# Patient Record
Sex: Female | Born: 1965
Health system: Southern US, Community
[De-identification: ages and names within clinical notes are randomized; demographics above are authoritative.]

## PROBLEM LIST (undated history)

## (undated) DIAGNOSIS — F1910 Other psychoactive substance abuse, uncomplicated: Secondary | ICD-10-CM

## (undated) DIAGNOSIS — J449 Chronic obstructive pulmonary disease, unspecified: Secondary | ICD-10-CM

## (undated) DIAGNOSIS — F419 Anxiety disorder, unspecified: Secondary | ICD-10-CM

## (undated) HISTORY — PX: CHOLECYSTECTOMY: SHX55

## (undated) HISTORY — PX: TUBAL LIGATION: SHX77

---

## 1998-10-15 ENCOUNTER — Emergency Department (HOSPITAL_COMMUNITY): Admission: EM | Admit: 1998-10-15 | Discharge: 1998-10-15 | Payer: Self-pay | Admitting: Emergency Medicine

## 1999-02-19 ENCOUNTER — Encounter: Payer: Self-pay | Admitting: *Deleted

## 1999-02-19 ENCOUNTER — Emergency Department (HOSPITAL_COMMUNITY): Admission: EM | Admit: 1999-02-19 | Discharge: 1999-02-19 | Payer: Self-pay | Admitting: Emergency Medicine

## 2000-10-13 ENCOUNTER — Inpatient Hospital Stay (HOSPITAL_COMMUNITY): Admission: AD | Admit: 2000-10-13 | Discharge: 2000-10-13 | Payer: Self-pay | Admitting: Obstetrics & Gynecology

## 2003-07-10 ENCOUNTER — Emergency Department (HOSPITAL_COMMUNITY): Admission: EM | Admit: 2003-07-10 | Discharge: 2003-07-10 | Payer: Self-pay | Admitting: Emergency Medicine

## 2004-10-10 ENCOUNTER — Emergency Department (HOSPITAL_COMMUNITY): Admission: EM | Admit: 2004-10-10 | Discharge: 2004-10-10 | Payer: Self-pay | Admitting: Emergency Medicine

## 2004-10-12 ENCOUNTER — Emergency Department (HOSPITAL_COMMUNITY): Admission: EM | Admit: 2004-10-12 | Discharge: 2004-10-12 | Payer: Self-pay | Admitting: Emergency Medicine

## 2005-04-11 ENCOUNTER — Emergency Department (HOSPITAL_COMMUNITY): Admission: EM | Admit: 2005-04-11 | Discharge: 2005-04-11 | Payer: Self-pay | Admitting: Emergency Medicine

## 2007-10-11 ENCOUNTER — Emergency Department (HOSPITAL_COMMUNITY): Admission: EM | Admit: 2007-10-11 | Discharge: 2007-10-12 | Payer: Self-pay | Admitting: Emergency Medicine

## 2008-04-13 ENCOUNTER — Emergency Department (HOSPITAL_COMMUNITY): Admission: EM | Admit: 2008-04-13 | Discharge: 2008-04-13 | Payer: Self-pay | Admitting: Emergency Medicine

## 2009-02-13 ENCOUNTER — Emergency Department (HOSPITAL_COMMUNITY): Admission: EM | Admit: 2009-02-13 | Discharge: 2009-02-13 | Payer: Self-pay | Admitting: Emergency Medicine

## 2010-02-03 ENCOUNTER — Emergency Department (HOSPITAL_COMMUNITY): Admission: EM | Admit: 2010-02-03 | Discharge: 2010-02-03 | Payer: Self-pay | Admitting: Emergency Medicine

## 2010-08-13 ENCOUNTER — Emergency Department (HOSPITAL_COMMUNITY): Admission: EM | Admit: 2010-08-13 | Discharge: 2010-08-13 | Payer: Self-pay | Admitting: Emergency Medicine

## 2011-12-21 ENCOUNTER — Emergency Department (HOSPITAL_COMMUNITY)
Admission: EM | Admit: 2011-12-21 | Discharge: 2011-12-21 | Disposition: A | Discharge status: Home / Self Care | Payer: Self-pay | Attending: Emergency Medicine | Admitting: Emergency Medicine

## 2011-12-21 ENCOUNTER — Encounter: Payer: Self-pay | Admitting: *Deleted

## 2011-12-21 DIAGNOSIS — K089 Disorder of teeth and supporting structures, unspecified: Secondary | ICD-10-CM | POA: Insufficient documentation

## 2011-12-21 DIAGNOSIS — F17200 Nicotine dependence, unspecified, uncomplicated: Secondary | ICD-10-CM | POA: Insufficient documentation

## 2011-12-21 DIAGNOSIS — R6884 Jaw pain: Secondary | ICD-10-CM | POA: Insufficient documentation

## 2011-12-21 DIAGNOSIS — K047 Periapical abscess without sinus: Secondary | ICD-10-CM | POA: Insufficient documentation

## 2011-12-21 DIAGNOSIS — K029 Dental caries, unspecified: Secondary | ICD-10-CM | POA: Insufficient documentation

## 2011-12-21 MED ORDER — HYDROMORPHONE HCL PF 2 MG/ML IJ SOLN
2.0000 mg | Freq: Once | INTRAMUSCULAR | Status: AC
  Administered 2011-12-21: 2 mg via INTRAMUSCULAR
  Filled 2011-12-21: qty 1

## 2011-12-21 MED ORDER — IBUPROFEN 800 MG PO TABS: 800.0000 mg | ORAL_TABLET | Freq: Three times a day (TID) | ORAL | Status: AC

## 2011-12-21 MED ORDER — PERCOCET 5-325 MG PO TABS: 1.0000 | ORAL_TABLET | Freq: Four times a day (QID) | ORAL | Status: AC | PRN

## 2011-12-21 MED ORDER — CLINDAMYCIN HCL 300 MG PO CAPS: 300.0000 mg | ORAL_CAPSULE | Freq: Three times a day (TID) | ORAL | Status: DC

## 2011-12-21 NOTE — ED Provider Notes (Signed)
History     CSN: [id number]  Arrival date & time 12/21/11  7829   First MD Initiated Contact with Patient 12/21/11 [phone]      Chief Complaint  Patient presents with  . Dental Pain    (Consider location/radiation/quality/duration/timing/severity/associated sxs/prior treatment) HPI Comments: Patient right pain and swelling in her right upper jaw and pain where her teeth have decayed and broken.  Denies fevers, sore throat, difficulty swallowing or breathing.  Pt does not have dentist.    Patient is a 45 y.o. female presenting with tooth pain. The history is provided by the patient.  Dental Pain   History reviewed. No pertinent past medical history.  Past Surgical History  Procedure Date  . Cholecystectomy   . Cesarean section     No family history on file.  History  Substance Use Topics  . Smoking status: Current Everyday Smoker -- 1.0 packs/day for 30 years    Types: Cigarettes  . Smokeless tobacco: Not on file  . Alcohol Use: Yes     occasionally    OB History    Grav Para Term Preterm Abortions TAB SAB Ect Mult Living                  Review of Systems  All other systems reviewed and are negative.    Allergies  Penicillins  Home Medications   Current Outpatient Rx  Name Route Sig Dispense Refill  . NAPROXEN SODIUM 220 MG PO TABS Oral Take 220 mg by mouth 2 (two) times daily with a meal. For pain       BP 129/72  Pulse 66  Temp(Src) 98.2 F (36.8 C) (Oral)  Resp 20  Ht 5\' 3"  (1.6 m)  Wt 160 lb (72.576 kg)  BMI 28.34 kg/m2  SpO2 98%  LMP 12/12/2011  Physical Exam  Nursing note and vitals reviewed. Constitutional: She is oriented to person, place, and time. She appears well-developed and well-nourished.  HENT:  Head: Normocephalic and atraumatic.    Mouth/Throat: Oropharynx is clear and moist. No oropharyngeal exudate.         Widespread dental decay, multiple teeth decayed to the gumline.  Swelling adjacent to right upper molars.      Neck: Neck supple.  Pulmonary/Chest: Effort normal.  Neurological: She is alert and oriented to person, place, and time.    ED Course  Procedures (including critical care time)  Labs Reviewed - No data to display No results found.   1. Dental abscess       MDM  Patient with right upper dental abscess without fever or airway compromise.  Have explained importance of 48 hour follow up with on-call dentist.  Pt verbalizes understanding.  Pain treated in ED, prescriptions given.         Dillard Cannon [place], [place] 12/21/11 [phone]

## 2011-12-21 NOTE — ED Provider Notes (Signed)
Medical screening examination/treatment/procedure(s) were performed by non-physician practitioner and as supervising physician I was immediately available for consultation/collaboration.   Chesky Heyer A. Patrica Duel, MD 12/21/11 3086

## 2011-12-21 NOTE — ED Notes (Signed)
Pt c/o upper tooth pain x 2 - swelling noted to jaw. States has the teeth are broken x years - no dentist.

## 2011-12-22 ENCOUNTER — Encounter (HOSPITAL_COMMUNITY): Payer: Self-pay

## 2011-12-22 ENCOUNTER — Emergency Department (HOSPITAL_COMMUNITY)
Admission: EM | Admit: 2011-12-22 | Discharge: 2011-12-22 | Disposition: A | Discharge status: Home / Self Care | Payer: Self-pay | Attending: Emergency Medicine | Admitting: Emergency Medicine

## 2011-12-22 DIAGNOSIS — K089 Disorder of teeth and supporting structures, unspecified: Secondary | ICD-10-CM | POA: Insufficient documentation

## 2011-12-22 DIAGNOSIS — K047 Periapical abscess without sinus: Secondary | ICD-10-CM | POA: Insufficient documentation

## 2011-12-22 DIAGNOSIS — F17200 Nicotine dependence, unspecified, uncomplicated: Secondary | ICD-10-CM | POA: Insufficient documentation

## 2011-12-22 MED ORDER — PENICILLIN V POTASSIUM 500 MG PO TABS: 500.0000 mg | ORAL_TABLET | Freq: Four times a day (QID) | ORAL | Status: AC

## 2011-12-22 MED ORDER — IBUPROFEN 200 MG PO TABS
400.0000 mg | ORAL_TABLET | Freq: Once | ORAL | Status: AC
  Administered 2011-12-22: 400 mg via ORAL
  Filled 2011-12-22: qty 2

## 2011-12-22 MED ORDER — OXYCODONE-ACETAMINOPHEN 5-325 MG PO TABS
2.0000 | ORAL_TABLET | Freq: Once | ORAL | Status: AC
  Administered 2011-12-22: 2 via ORAL
  Filled 2011-12-22: qty 2

## 2011-12-22 NOTE — ED Notes (Signed)
Pt. Reports she was in [facility] last night for same problem.  She was prescribed a med that she cannot afford.  Pt presents today with continued tooth pain and swelling of the right cheek.

## 2011-12-22 NOTE — ED Provider Notes (Signed)
History    45yF presenting with facial pain. Seen in ED yesterday and diagnosed with dental abscess. Given script for clindamycin because of reported PCN allergy. Did not fill because could not afford. Presenting today for different abx. No new complaints. Says not significant change in symptoms since last evaluation. No fever or chills. No n/v.   CSN: [id number]  Arrival date & time 12/22/11  1518   First MD Initiated Contact with Patient 12/22/11 1652      Chief Complaint  Patient presents with  . Dental Pain    (Consider location/radiation/quality/duration/timing/severity/associated sxs/prior treatment) HPI  History reviewed. No pertinent past medical history.  Past Surgical History  Procedure Date  . Cholecystectomy   . Cesarean section     No family history on file.  History  Substance Use Topics  . Smoking status: Current Everyday Smoker -- 1.0 packs/day for 30 years    Types: Cigarettes  . Smokeless tobacco: Not on file  . Alcohol Use: Yes     occasionally    OB History    Grav Para Term Preterm Abortions TAB SAB Ect Mult Living                  Review of Systems   Review of symptoms negative unless otherwise noted in HPI.   Allergies  Penicillins  Home Medications   Current Outpatient Rx  Name Route Sig Dispense Refill  . IBUPROFEN 800 MG PO TABS Oral Take 1 tablet (800 mg total) by mouth 3 (three) times daily. 21 tablet 0  . NAPROXEN SODIUM 220 MG PO TABS Oral Take 220 mg by mouth 2 (two) times daily with a meal. For pain     . PERCOCET 5-325 MG PO TABS Oral Take 1 tablet by mouth every 6 (six) hours as needed for pain. 15 tablet 0    Dispense as written.  Marland Kitchen PENICILLIN V POTASSIUM 500 MG PO TABS Oral Take 1 tablet (500 mg total) by mouth 4 (four) times daily. 40 tablet 0    BP 121/72  Pulse 74  Temp(Src) 98.1 F (36.7 C) (Oral)  Resp 20  SpO2 98%  LMP 12/12/2011  Physical Exam  Nursing note and vitals reviewed. Constitutional: She  appears well-developed and well-nourished. No distress.  HENT:  Head: Normocephalic and atraumatic.  Mouth/Throat: No oropharyngeal exudate.       Mild R facial swelling. Generally poor dentition and widespread decay. Fluctuant lesion coming to head gingival mucosa R maxillary region. Posterior pharynx grossly normal. Uvula midline. Submental tissues soft. No trismus.  Eyes: Conjunctivae are normal. Right eye exhibits no discharge. Left eye exhibits no discharge.  Neck: Neck supple.  Cardiovascular: Normal rate, regular rhythm and normal heart sounds.  Exam reveals no gallop and no friction rub.   No murmur heard. Pulmonary/Chest: Effort normal and breath sounds normal. No respiratory distress.  Abdominal: Soft. She exhibits no distension. There is no tenderness.  Musculoskeletal: She exhibits no edema and no tenderness.  Lymphadenopathy:    She has no cervical adenopathy.  Neurological: She is alert.  Skin: Skin is warm and dry.  Psychiatric: She has a normal mood and affect. Her behavior is normal. Thought content normal.    ED Course  Procedures (including critical care time)  INCISION AND DRAINAGE Performed by: Raeford Razor Consent: Verbal consent obtained. Risks and benefits: risks, benefits and alternatives were discussed Type: abscess  Body area: oral gingival mucosa R maxillary region  Anesthesia: local infiltration  Local anesthetic: lidocaine  1% w/ epinephrine  Anesthetic total: 1.5 ml  Complexity: complex Blunt dissection to break up loculations  Drainage: purulent  Drainage amount: 2cc   Patient tolerance: Patient tolerated the procedure well with no immediate complications.    Labs Reviewed - No data to display No results found.   1. Dental abscess       MDM  Pt reports allergy to PCN is n/v. No rash, swelling or difficulty breathing or swallowing. Side effect and not true allergy. Script for pen VK given because of affordability. Again  stressed need for dental fu. Resource list provided. Given prescription for pain meds yesterday.        Raeford Razor, MD 12/22/11 [phone]

## 2012-06-05 ENCOUNTER — Encounter (HOSPITAL_COMMUNITY): Payer: Self-pay | Admitting: Emergency Medicine

## 2012-06-05 ENCOUNTER — Emergency Department (HOSPITAL_COMMUNITY)
Admission: EM | Admit: 2012-06-05 | Discharge: 2012-06-05 | Disposition: A | Discharge status: Home / Self Care | Payer: Self-pay | Attending: Emergency Medicine | Admitting: Emergency Medicine

## 2012-06-05 ENCOUNTER — Emergency Department (HOSPITAL_COMMUNITY): Payer: Self-pay

## 2012-06-05 DIAGNOSIS — T07XXXA Unspecified multiple injuries, initial encounter: Secondary | ICD-10-CM

## 2012-06-05 DIAGNOSIS — S0990XA Unspecified injury of head, initial encounter: Secondary | ICD-10-CM | POA: Insufficient documentation

## 2012-06-05 DIAGNOSIS — R51 Headache: Secondary | ICD-10-CM | POA: Insufficient documentation

## 2012-06-05 DIAGNOSIS — IMO0002 Reserved for concepts with insufficient information to code with codable children: Secondary | ICD-10-CM | POA: Insufficient documentation

## 2012-06-05 DIAGNOSIS — M542 Cervicalgia: Secondary | ICD-10-CM | POA: Insufficient documentation

## 2012-06-05 MED ORDER — TETANUS-DIPHTH-ACELL PERTUSSIS 5-2.5-18.5 LF-MCG/0.5 IM SUSP
0.5000 mL | Freq: Once | INTRAMUSCULAR | Status: AC
  Administered 2012-06-05: 0.5 mL via INTRAMUSCULAR
  Filled 2012-06-05: qty 0.5

## 2012-06-05 MED ORDER — OXYCODONE-ACETAMINOPHEN 5-325 MG PO TABS
1.0000 | ORAL_TABLET | Freq: Once | ORAL | Status: AC
  Administered 2012-06-05: 1 via ORAL
  Filled 2012-06-05: qty 1

## 2012-06-05 MED ORDER — HYDROCODONE-ACETAMINOPHEN 5-500 MG PO TABS: 1.0000 | ORAL_TABLET | Freq: Four times a day (QID) | ORAL | Status: AC | PRN
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
## 2012-06-05 NOTE — ED Notes (Signed)
Rx x 1, pt voiced understanding to return for worsening in condition.
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
## 2012-06-05 NOTE — Discharge Instructions (Signed)
Abrasions Abrasions are skin scrapes. Their treatment depends on how large and deep the abrasion is. Abrasions do not extend through all layers of the skin. A cut or lesion through all skin layers is called a laceration. HOME CARE INSTRUCTIONS   If you were given a dressing, change it at least once a day or as instructed by your caregiver. If the bandage sticks, soak it off with a solution of water or hydrogen peroxide.   Twice a day, wash the area with soap and water to remove all the cream/ointment. You may do this in a sink, under a tub faucet, or in a shower. Rinse off the soap and pat dry with a clean towel. Look for signs of infection (see below).   Reapply cream/ointment according to your caregiver's instruction. This will help prevent infection and keep the bandage from sticking. Telfa or gauze over the wound and under the dressing or wrap will also help keep the bandage from sticking.   If the bandage becomes wet, dirty, or develops a foul smell, change it as soon as possible.   Only take over-the-counter or prescription medicines for pain, discomfort, or fever as directed by your caregiver.  SEEK IMMEDIATE MEDICAL CARE IF:   Increasing pain in the wound.   Signs of infection develop: redness, swelling, surrounding area is tender to touch, or pus coming from the wound.   You have a fever.   Any foul smell coming from the wound or dressing.  Most skin wounds heal within ten days. Facial wounds heal faster. However, an infection may occur despite proper treatment. You should have the wound checked for signs of infection within 24 to 48 hours or sooner if problems arise. If you were not given a wound-check appointment, look closely at the wound yourself on the second day for early signs of infection listed above. MAKE SURE YOU:   Understand these instructions.   Will watch your condition.   Will get help right away if you are not doing well or get worse.  Document Released:  09/18/2005 Document Revised: 11/28/2011 Document Reviewed: 11/12/2011 [facility] Patient Information 2012 [place], [place].Assault, General Assault includes any behavior, whether intentional or reckless, which results in bodily injury to another person and/or damage to property. Included in this would be any behavior, intentional or reckless, that by its nature would be understood (interpreted) by a reasonable person as intent to harm another person or to damage his/her property. Threats may be oral or written. They may be communicated through regular mail, computer, fax, or phone. These threats may be direct or implied. FORMS OF ASSAULT INCLUDE:  Physically assaulting a person. This includes physical threats to inflict physical harm as well as:   Slapping.   Hitting.   Poking.   Kicking.   Punching.   Pushing.   Arson.   Sabotage.   Equipment vandalism.   Damaging or destroying property.   Throwing or hitting objects.   Displaying a weapon or an object that appears to be a weapon in a threatening manner.   Carrying a firearm of any kind.   Using a weapon to harm someone.   Using greater physical size/strength to intimidate another.   Making intimidating or threatening gestures.   Bullying.   Hazing.   Intimidating, threatening, hostile, or abusive language directed toward another person.   It communicates the intention to engage in violence against that person. And it leads a reasonable person to expect that violent behavior may occur.  Stalking another person.  IF IT HAPPENS AGAIN:  Immediately call for emergency help (911 in [place]   If someone poses clear and immediate danger to you, seek legal authorities to have a protective or restraining order put in place.   Less threatening assaults can at least be reported to authorities.  STEPS TO TAKE IF A SEXUAL ASSAULT HAS HAPPENED  Go to an area of safety. This may include a shelter or staying with a friend. Stay  away from the area where you have been attacked. A large percentage of sexual assaults are caused by a friend, relative or associate.   If medications were given by your caregiver, take them as directed for the full length of time prescribed.   Only take over-the-counter or prescription medicines for pain, discomfort, or fever as directed by your caregiver.   If you have come in contact with a sexual disease, find out if you are to be tested again. If your caregiver is concerned about the HIV/AIDS virus, he/she may require you to have continued testing for several months.   For the protection of your privacy, test results can not be given over the phone. Make sure you receive the results of your test. If your test results are not back during your visit, make an appointment with your caregiver to find out the results. Do not assume everything is normal if you have not heard from your caregiver or the medical facility. It is important for you to follow up on all of your test results.   File appropriate papers with authorities. This is important in all assaults, even if it has occurred in a family or by a friend.  SEEK MEDICAL CARE IF:  You have new problems because of your injuries.   You have problems that may be because of the medicine you are taking, such as:   Rash.   Itching.   Swelling.   Trouble breathing.   You develop belly (abdominal) pain, feel sick to your stomach (nausea) or are vomiting.   You begin to run a temperature.   You need supportive care or referral to a rape crisis center. These are centers with trained personnel who can help you get through this ordeal.  SEEK IMMEDIATE MEDICAL CARE IF:  You are afraid of being threatened, beaten, or abused. In [place] call 911.   You receive new injuries related to abuse.   You develop severe pain in any area injured in the assault or have any change in your condition that concerns you.   You faint or lose consciousness.     You develop chest pain or shortness of breath.  Document Released: 12/09/2005 Document Revised: 11/28/2011 Document Reviewed: 07/27/2008 [facility] Patient Information 2012 [place], [place].

## 2012-09-03 ENCOUNTER — Other Ambulatory Visit (HOSPITAL_COMMUNITY): Payer: Self-pay | Admitting: Nurse Practitioner

## 2012-09-03 DIAGNOSIS — Z1231 Encounter for screening mammogram for malignant neoplasm of breast: Secondary | ICD-10-CM

## 2012-09-17 ENCOUNTER — Ambulatory Visit (HOSPITAL_COMMUNITY): Payer: Self-pay | Attending: Nurse Practitioner

## 2013-03-18 ENCOUNTER — Emergency Department (HOSPITAL_COMMUNITY)
Admission: EM | Admit: 2013-03-18 | Discharge: 2013-03-18 | Disposition: A | Discharge status: Home / Self Care | Payer: Self-pay | Attending: Emergency Medicine | Admitting: Emergency Medicine

## 2013-03-18 ENCOUNTER — Encounter (HOSPITAL_COMMUNITY): Payer: Self-pay | Admitting: *Deleted

## 2013-03-18 DIAGNOSIS — F17200 Nicotine dependence, unspecified, uncomplicated: Secondary | ICD-10-CM | POA: Insufficient documentation

## 2013-03-18 DIAGNOSIS — K0889 Other specified disorders of teeth and supporting structures: Secondary | ICD-10-CM

## 2013-03-18 DIAGNOSIS — K089 Disorder of teeth and supporting structures, unspecified: Secondary | ICD-10-CM | POA: Insufficient documentation

## 2013-03-18 MED ORDER — TRAMADOL HCL 50 MG PO TABS
50.0000 mg | ORAL_TABLET | Freq: Once | ORAL | Status: AC
  Administered 2013-03-18: 50 mg via ORAL
  Filled 2013-03-18: qty 1

## 2013-03-18 MED ORDER — CLINDAMYCIN HCL 150 MG PO CAPS: 300.0000 mg | ORAL_CAPSULE | Freq: Three times a day (TID) | ORAL | Status: DC

## 2013-03-18 MED ORDER — CLINDAMYCIN HCL 150 MG PO CAPS
300.0000 mg | ORAL_CAPSULE | Freq: Once | ORAL | Status: AC
  Administered 2013-03-18: 300 mg via ORAL
  Filled 2013-03-18: qty 2

## 2013-03-18 MED ORDER — OXYCODONE-ACETAMINOPHEN 5-325 MG PO TABS
1.0000 | ORAL_TABLET | Freq: Once | ORAL | Status: AC
  Administered 2013-03-18: 1 via ORAL
  Filled 2013-03-18: qty 1

## 2013-03-18 MED ORDER — OXYCODONE-ACETAMINOPHEN 5-325 MG PO TABS: 2.0000 | ORAL_TABLET | ORAL | Status: DC | PRN

## 2013-03-18 NOTE — ED Notes (Signed)
Pt seen here previously. Never saw a dentist, "I can't afford it".

## 2013-03-18 NOTE — ED Notes (Signed)
Pt with intermittent dental pain for several months on 5 different teeth in her mouth c/o increasing pain that oragel is not helping with.

## 2013-03-18 NOTE — ED Provider Notes (Signed)
History     CSN: [id number]  Arrival date & time 03/18/13  1349   First MD Initiated Contact with Patient 03/18/13 1422      Chief Complaint  Patient presents with  . Dental Pain    (Consider location/radiation/quality/duration/timing/severity/associated sxs/prior treatment) HPI Comments: The patient is a 47 year old otherwise healthy female who presents with dental pain that started gradually two days ago. The dental pain is severe, constant and progressively worsening. The pain is aching and located in right upper jaw. The pain does not radiate. Eating makes the pain worse. Nothing makes the pain better. The patient has tried oragel for pain. No associated symptoms. Patient denies headache, neck pain/stiffness, fever, NVD, edema, sore throat, throat swelling, wheezing, SOB, chest pain, abdominal pain.     Patient is a 47 y.o. female presenting with tooth pain.  Dental Pain   History reviewed. No pertinent past medical history.  Past Surgical History  Procedure Laterality Date  . Cholecystectomy    . Cesarean section    . Tubal ligation      No family history on file.  History  Substance Use Topics  . Smoking status: Current Every Day Smoker -- 1.00 packs/day for 30 years    Types: Cigarettes  . Smokeless tobacco: Not on file  . Alcohol Use: Yes     Comment: occasionally    OB History   Grav Para Term Preterm Abortions TAB SAB Ect Mult Living                  Review of Systems  HENT: Positive for dental problem.   All other systems reviewed and are negative.    Allergies  Penicillins  Home Medications   Current Outpatient Rx  Name  Route  Sig  Dispense  Refill  . acetaminophen (TYLENOL) 500 MG tablet   Oral   Take 1,500 mg by mouth every 6 (six) hours as needed for pain.           BP 119/81  Pulse 84  Temp(Src) 98.4 F (36.9 C) (Oral)  Resp 18  SpO2 95%  Physical Exam  Nursing note and vitals reviewed. Constitutional: She is oriented to  person, place, and time. She appears well-developed and well-nourished. No distress.  HENT:  Head: Normocephalic and atraumatic.  Mouth/Throat: Oropharynx is clear and moist. No oropharyngeal exudate.  Poor dentition. Multiple cracked and decayed teeth. Right upper molars tender to percussion.   Eyes: Conjunctivae are normal.  Neck: Normal range of motion. Neck supple.  Cardiovascular: Normal rate and regular rhythm.  Exam reveals no gallop and no friction rub.   No murmur heard. Pulmonary/Chest: Effort normal and breath sounds normal. She has no wheezes. She has no rales. She exhibits no tenderness.  Abdominal: Soft. There is no tenderness.  Musculoskeletal: Normal range of motion.  Lymphadenopathy:    She has no cervical adenopathy.  Neurological: She is alert and oriented to person, place, and time.  Speech is goal-oriented. Moves limbs without ataxia.   Skin: Skin is warm and dry.  Psychiatric: She has a normal mood and affect. Her behavior is normal.    ED Course  Procedures (including critical care time)  Labs Reviewed - No data to display No results found.   1. Pain, dental       MDM  3:14 PM Patient will be discharged with Clindamycin and Percocet. Patient instructed to follow up with a dentist contracted through [facility]. Patient instructed to call within 48  hours of being seen in the ED to guarantee appointment. Vitals stable and patient afebrile. Patient instructed to return with worsening or concerning symptoms.        Emilia Beck, PA-C 03/18/13 1520

## 2013-03-20 NOTE — ED Provider Notes (Signed)
Medical screening examination/treatment/procedure(s) were performed by non-physician practitioner and as supervising physician I was immediately available for consultation/collaboration.  Ellyse Rotolo, MD 03/20/13 1558 

## 2014-02-19 ENCOUNTER — Emergency Department (HOSPITAL_COMMUNITY): Payer: Self-pay

## 2014-02-19 ENCOUNTER — Encounter (HOSPITAL_COMMUNITY): Payer: Self-pay | Admitting: Emergency Medicine

## 2014-02-19 ENCOUNTER — Emergency Department (HOSPITAL_COMMUNITY)
Admission: EM | Admit: 2014-02-19 | Discharge: 2014-02-19 | Disposition: A | Discharge status: Home / Self Care | Payer: Self-pay | Attending: Emergency Medicine | Admitting: Emergency Medicine

## 2014-02-19 DIAGNOSIS — S62509A Fracture of unspecified phalanx of unspecified thumb, initial encounter for closed fracture: Secondary | ICD-10-CM

## 2014-02-19 DIAGNOSIS — X500XXA Overexertion from strenuous movement or load, initial encounter: Secondary | ICD-10-CM | POA: Insufficient documentation

## 2014-02-19 DIAGNOSIS — Y929 Unspecified place or not applicable: Secondary | ICD-10-CM | POA: Insufficient documentation

## 2014-02-19 DIAGNOSIS — Z792 Long term (current) use of antibiotics: Secondary | ICD-10-CM | POA: Insufficient documentation

## 2014-02-19 DIAGNOSIS — M25539 Pain in unspecified wrist: Secondary | ICD-10-CM

## 2014-02-19 DIAGNOSIS — Z880 Allergy status to penicillin: Secondary | ICD-10-CM | POA: Insufficient documentation

## 2014-02-19 DIAGNOSIS — F17200 Nicotine dependence, unspecified, uncomplicated: Secondary | ICD-10-CM | POA: Insufficient documentation

## 2014-02-19 DIAGNOSIS — S62639A Displaced fracture of distal phalanx of unspecified finger, initial encounter for closed fracture: Secondary | ICD-10-CM | POA: Insufficient documentation

## 2014-02-19 DIAGNOSIS — Y9372 Activity, wrestling: Secondary | ICD-10-CM | POA: Insufficient documentation

## 2014-02-19 MED ORDER — HYDROCODONE-ACETAMINOPHEN 5-325 MG PO TABS
1.0000 | ORAL_TABLET | Freq: Once | ORAL | Status: AC
  Administered 2014-02-19: 1 via ORAL
  Filled 2014-02-19: qty 1

## 2014-02-19 MED ORDER — HYDROCODONE-ACETAMINOPHEN 5-325 MG PO TABS: 1.0000 | ORAL_TABLET | ORAL | Status: DC | PRN

## 2014-02-19 NOTE — ED Notes (Signed)
Pt took bus to ED, sister will pick her up when discharged

## 2014-02-19 NOTE — ED Notes (Signed)
Patient transported to X-ray 
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
## 2014-02-19 NOTE — Progress Notes (Signed)
Orthopedic Tech Progress Note Patient Details:  Linda Foley 04-11-1966 [id number] Thumb spica splint applied to Right UE. Application tolerated well.  Ortho Devices Type of Ortho Device: Thumb spica splint Splint Material: Plaster Ortho Device/Splint Location: Right UE Ortho Device/Splint Interventions: Application   Asia R Thompson 02/19/2014, 5:03 PM

## 2014-02-19 NOTE — Discharge Instructions (Signed)
Read the information below.  Use the prescribed medication as directed.  Please discuss all new medications with your pharmacist.  Do not take additional tylenol while taking the prescribed pain medication to avoid overdose.  You may return to the Emergency Department at any time for worsening condition or any new symptoms that concern you.  If you develop uncontrolled pain, weakness or numbness of the extremity, severe discoloration of the skin, or you are unable to move your fingers, return to the ER for a recheck.       Cast or Splint Care Casts and splints support injured limbs and keep bones from moving while they heal. It is important to care for your cast or splint at home.  HOME CARE INSTRUCTIONS  Keep the cast or splint uncovered during the drying period. It can take 24 to 48 hours to dry if it is made of plaster. A fiberglass cast will dry in less than 1 hour.  Do not rest the cast on anything harder than a pillow for the first 24 hours.  Do not put weight on your injured limb or apply pressure to the cast until your health care provider gives you permission.  Keep the cast or splint dry. Wet casts or splints can lose their shape and may not support the limb as well. A wet cast that has lost its shape can also create harmful pressure on your skin when it dries. Also, wet skin can become infected.  Cover the cast or splint with a plastic bag when bathing or when out in the rain or snow. If the cast is on the trunk of the body, take sponge baths until the cast is removed.  If your cast does become wet, dry it with a towel or a blow dryer on the cool setting only.  Keep your cast or splint clean. Soiled casts may be wiped with a moistened cloth.  Do not place any hard or soft foreign objects under your cast or splint, such as cotton, toilet paper, lotion, or powder.  Do not try to scratch the skin under the cast with any object. The object could get stuck inside the cast. Also,  scratching could lead to an infection. If itching is a problem, use a blow dryer on a cool setting to relieve discomfort.  Do not trim or cut your cast or remove padding from inside of it.  Exercise all joints next to the injury that are not immobilized by the cast or splint. For example, if you have a long leg cast, exercise the hip joint and toes. If you have an arm cast or splint, exercise the shoulder, elbow, thumb, and fingers.  Elevate your injured arm or leg on 1 or 2 pillows for the first 1 to 3 days to decrease swelling and pain.It is best if you can comfortably elevate your cast so it is higher than your heart. SEEK MEDICAL CARE IF:   Your cast or splint cracks.  Your cast or splint is too tight or too loose.  You have unbearable itching inside the cast.  Your cast becomes wet or develops a soft spot or area.  You have a bad smell coming from inside your cast.  You get an object stuck under your cast.  Your skin around the cast becomes red or raw.  You have new pain or worsening pain after the cast has been applied. SEEK IMMEDIATE MEDICAL CARE IF:   You have fluid leaking through the cast.  You  are unable to move your fingers or toes.  You have discolored (blue or white), cool, painful, or very swollen fingers or toes beyond the cast.  You have tingling or numbness around the injured area.  You have severe pain or pressure under the cast.  You have any difficulty with your breathing or have shortness of breath.  You have chest pain. Document Released: 12/06/2000 Document Revised: 09/29/2013 Document Reviewed: 06/17/2013 [facility] Patient Information 2014 [place], [place].  Wrist Pain Wrist injuries are frequent in adults and children. A sprain is an injury to the ligaments that hold your bones together. A strain is an injury to muscle or muscle cord-like structures (tendons) from stretching or pulling. Generally, when wrists are moderately tender to touch following  a fall or injury, a break in the bone (fracture) may be present. Most wrist sprains or strains are better in 3 to 5 days, but complete healing may take several weeks. HOME CARE INSTRUCTIONS   Put ice on the injured area.  Put ice in a plastic bag.  Place a towel between your skin and the bag.  Leave the ice on for 15-20 minutes, 03-04 times a day, for the first 2 days.  Keep your arm raised above the level of your heart whenever possible to reduce swelling and pain.  Rest the injured area for at least 48 hours or as directed by your caregiver.  If a splint or elastic bandage has been applied, use it for as long as directed by your caregiver or until seen by a caregiver for a follow-up exam.  Only take over-the-counter or prescription medicines for pain, discomfort, or fever as directed by your caregiver.  Keep all follow-up appointments. You may need to follow up with a specialist or have follow-up X-rays. Improvement in pain level is not a guarantee that you did not fracture a bone in your wrist. The only way to determine whether or not you have a broken bone is by X-ray. SEEK IMMEDIATE MEDICAL CARE IF:   Your fingers are swollen, very red, white, or cold and blue.  Your fingers are numb or tingling.  You have increasing pain.  You have difficulty moving your fingers. MAKE SURE YOU:   Understand these instructions.  Will watch your condition.  Will get help right away if you are not doing well or get worse. Document Released: 09/18/2005 Document Revised: 03/02/2012 Document Reviewed: 01/30/2011 [facility] Patient Information 2014 [place], [place].  Thumb Fracture  There are many types of thumb fractures (breaks). There are different ways of treating these fractures, all of which may be correct, varying from case to case. Your caregiver will discuss different ways to treat these fractures with you. TREATMENT   Immobilization. This means the fracture is casted as it is without  changing the positions of the fracture (bone pieces) involved. This fracture is casted in a "thumb spica" also called a hitchhiker cast. It is generally left on for 2 to 6 weeks.  Closed reduction. The bones are manipulated back into position without using surgery.  ORIF (open reduction and internal fixation). The fracture site is opened and the bone pieces are fixed into place with some type of hardware such as screws or wires. Your caregiver will discuss the type of fracture you have and the treatment that will be best for that problem. If surgery is the treatment of choice, the following is information for you to know and to let your caregiver know about prior to surgery. LET YOUR CAREGIVERS  KNOW ABOUT:  Allergies.  Medications taken including herbs, eye drops, over the counter medications, and creams.  Use of steroids (by mouth or creams).  Previous problems with anesthetics or Novocain.  Family history of anesthetic complications..  Possibility of pregnancy, if this applies.  History of blood clots (thrombophlebitis).  History of bleeding or blood problems.  Previous surgery.  Other health problems. AFTER THE PROCEDURE  After surgery, you will be taken to the recovery area. A nurse will watch and check your progress. Once you are awake, stable, and taking fluids well, barring other problems you will be allowed to go home. Once home, an ice pack applied to your operative site may help with discomfort and keep the swelling down. Elevate your hand above your heart as much as possible for the first 4-5 days after the injury/surgery. HOME CARE INSTRUCTIONS   Follow your caregiver's instructions as to activities, exercises, physical therapy, and driving a car.  Use thumb and exercise as directed.  Only take over-the-counter or prescription medicines for pain, discomfort, or fever as directed by your caregiver. Do not take aspirin until your caregiver instructs. This can increase  bleeding immediately following surgery. SEEK MEDICAL CARE IF:   There is increased bleeding (more than a small spot) from the wound or from beneath your cast or splint.  There is redness, swelling, or increasing pain in the wound or from beneath your cast or splint.  You have pus coming from wound or from beneath your cast or splint.  An unexplained oral temperature above 102 F (38.9 C) develops.  There is a foul smell coming from the wound or dressing or from beneath your cast or splint. SEEK IMMEDIATE MEDICAL CARE IF:   You develop severe pain, decreased sensation such as numbness or tingling.  You develop a rash.  You have difficulty breathing.  Youhave any allergic problems. If you do not have a window in your cast for observing the wound, a discharge or minor bleeding may show up as a stain on the outside of your cast. Report these findings to your caregiver. If you have a removable splint overlying the surgical dressings it is common to see a small amount of bleeding. Change the dressings as instructed by your caregiver. Document Released: 09/07/2003 Document Revised: 03/02/2012 Document Reviewed: 04/18/2008 [facility] Patient Information 2014 [place], [place].

## 2014-02-19 NOTE — ED Provider Notes (Signed)
CSN: [id number]     Arrival date & time 02/19/14  1418 History  This chart was scribed for non-physician practitioner, Trixie DredgeEmily Bryla Burek, PA-C working with Lyanne CoKevin M Campos, MD by Greggory StallionKayla Andersen, ED scribe. This patient was seen in room TR09C/TR09C and the patient's care was started at 3:27 PM.   Chief Complaint  Patient presents with  . Hand Pain   The history is provided by the patient. No language interpreter was used.   HPI Comments: Linda Foley is a 48 y.o. female who presents to the Emergency Department complaining of right thumb injury that occurred earlier today while she was wrestling with her boyfriend. Pt thinks she bent her thumb back. She has sudden onset right thumb pain and swelling. Rates pain 10/10. Pt states pain is worsened with movement. She has mild numbness in her hand. Denies weakness.  Denies that this was a malicious injury and states that it truly was playful.   History reviewed. No pertinent past medical history. Past Surgical History  Procedure Laterality Date  . Cholecystectomy    . Cesarean section    . Tubal ligation     No family history on file. History  Substance Use Topics  . Smoking status: Current Every Day Smoker -- 1.00 packs/day for 30 years    Types: Cigarettes  . Smokeless tobacco: Not on file  . Alcohol Use: Yes     Comment: occasionally   OB History   Grav Para Term Preterm Abortions TAB SAB Ect Mult Living                 Review of Systems  Musculoskeletal: Positive for arthralgias and joint swelling.  Neurological: Positive for numbness. Negative for weakness.  All other systems reviewed and are negative.   Allergies  Penicillins  Home Medications   Current Outpatient Rx  Name  Route  Sig  Dispense  Refill  . acetaminophen (TYLENOL) 500 MG tablet   Oral   Take 1,500 mg by mouth every 6 (six) hours as needed for pain.         . clindamycin (CLEOCIN) 150 MG capsule   Oral   Take 2 capsules (300 mg total) by mouth 3 (three)  times daily. May dispense as 150mg  capsules   60 capsule   0   . oxyCODONE-acetaminophen (PERCOCET/ROXICET) 5-325 MG per tablet   Oral   Take 2 tablets by mouth every 4 (four) hours as needed for pain.   6 tablet   0     Only fill Percocet with Clindamycin prescription.    BP 124/58  Pulse 69  Temp(Src) 98.1 F (36.7 C) (Oral)  Resp 18  SpO2 98%  Physical Exam  Nursing note and vitals reviewed. Constitutional: She appears well-developed and well-nourished. No distress.  HENT:  Head: Normocephalic and atraumatic.  Neck: Neck supple.  Pulmonary/Chest: Effort normal.  Musculoskeletal:  Tender throughout right thumb. Edematous. Mild ecchymosis. Tenderness over dorsal aspect and thenar eminence. Active ROM somewhat limited secondary to pain. Radial tenderness of wrist. Snuff box tenderness.   Neurological: She is alert.  Skin: She is not diaphoretic.    ED Course  Procedures (including critical care time)  DIAGNOSTIC STUDIES: Oxygen Saturation is 98% on RA, normal by my interpretation.    COORDINATION OF CARE: 3:29 PM-Discussed treatment plan which includes wrist xray, splint and pain medication with pt at bedside and pt agreed to plan.   Labs Review Labs Reviewed - No data to display Imaging Review Dg  Wrist Complete Right  02/19/2014   CLINICAL DATA:  Pain after injury.  EXAM: RIGHT WRIST - COMPLETE 3+ VIEW  COMPARISON:  None.  FINDINGS: There is no evidence of fracture or dislocation over the wrist. There is evidence of patient's known fracture along the volar base of the first distal phalanx. There is no evidence of arthropathy or other focal bone abnormality. Soft tissues are unremarkable.  IMPRESSION: No acute wrist fracture.  Evidence of known fracture along the volar base of the first distal phalanx.   Electronically Signed   By: Elberta Fortis M.D.   On: 02/19/2014 16:30   Dg Finger Thumb Right  02/19/2014   CLINICAL DATA:  Jammed right thumb.  Pain.  EXAM: RIGHT  THUMB 2+V  COMPARISON:  None.  FINDINGS: A small avulsion fracture along the volar plate at the base of the distal phalanx is suggested on the lateral view, but not confirmed on the additional views.  No other evidence of a fracture. The joints are normally aligned. There are mild degenerative changes at the first metacarpophalangeal joint.  IMPRESSION: Small volar plate avulsion fracture at the base of the distal phalanx of the thumb is evident on the lateral view only. No other evidence of a fracture. No dislocation.   Electronically Signed   By: Amie Portland M.D.   On: 02/19/2014 15:18     EKG Interpretation None      MDM   Final diagnoses:  Avulsion fracture of thumb  Wrist pain    Pt with injury to right hand and wrist, found to have avulsion fracture of thumb, also with snuffbox tenderness, concern for occult scaphoid fracture.  Pt placed in short arm thumb spica splint, d/c home with norco, hand surgery follow up.  Discussed result, findings, treatment, and follow up  with patient.  Pt given return precautions.  Pt verbalizes understanding and agrees with plan.      I personally performed the services described in this documentation, which was scribed in my presence. The recorded information has been reviewed and is accurate.   [place], PA-C 02/19/14 [phone]

## 2014-02-20 NOTE — ED Provider Notes (Signed)
Medical screening examination/treatment/procedure(s) were performed by non-physician practitioner and as supervising physician I was immediately available for consultation/collaboration.   EKG Interpretation None        Jackelyn Illingworth M Mickenzie Stolar, MD 02/20/14 0722 

## 2015-12-31 ENCOUNTER — Emergency Department (HOSPITAL_BASED_OUTPATIENT_CLINIC_OR_DEPARTMENT_OTHER)
Admission: EM | Admit: 2015-12-31 | Discharge: 2016-01-01 | Disposition: A | Discharge status: Home / Self Care | Payer: Self-pay | Attending: Emergency Medicine | Admitting: Emergency Medicine

## 2015-12-31 ENCOUNTER — Emergency Department (HOSPITAL_BASED_OUTPATIENT_CLINIC_OR_DEPARTMENT_OTHER): Payer: Self-pay

## 2015-12-31 ENCOUNTER — Encounter (HOSPITAL_BASED_OUTPATIENT_CLINIC_OR_DEPARTMENT_OTHER): Payer: Self-pay | Admitting: Emergency Medicine

## 2015-12-31 DIAGNOSIS — J159 Unspecified bacterial pneumonia: Secondary | ICD-10-CM | POA: Insufficient documentation

## 2015-12-31 DIAGNOSIS — F17210 Nicotine dependence, cigarettes, uncomplicated: Secondary | ICD-10-CM | POA: Insufficient documentation

## 2015-12-31 DIAGNOSIS — J189 Pneumonia, unspecified organism: Secondary | ICD-10-CM

## 2015-12-31 DIAGNOSIS — Z880 Allergy status to penicillin: Secondary | ICD-10-CM | POA: Insufficient documentation

## 2015-12-31 MED ORDER — DEXAMETHASONE SODIUM PHOSPHATE 10 MG/ML IJ SOLN
10.0000 mg | Freq: Once | INTRAMUSCULAR | Status: AC
  Administered 2016-01-01: 10 mg via INTRAMUSCULAR
  Filled 2015-12-31: qty 1

## 2015-12-31 MED ORDER — IBUPROFEN 800 MG PO TABS
800.0000 mg | ORAL_TABLET | Freq: Once | ORAL | Status: AC
  Administered 2016-01-01: 800 mg via ORAL
  Filled 2015-12-31: qty 1

## 2015-12-31 MED ORDER — IPRATROPIUM-ALBUTEROL 0.5-2.5 (3) MG/3ML IN SOLN
3.0000 mL | RESPIRATORY_TRACT | Status: DC
Start: 2016-01-01 — End: 2016-01-01
  Administered 2016-01-01: 3 mL via RESPIRATORY_TRACT
  Filled 2015-12-31: qty 3

## 2015-12-31 NOTE — ED Notes (Signed)
Pt in c/o SOB and chest pain following 14 days of respiratory sx including cough and congestion. Pt is hoarse, airway intact.

## 2015-12-31 NOTE — ED Provider Notes (Signed)
CSN: [id number]     Arrival date & time 12/31/15  2251 History   First MD Initiated Contact with Patient 12/31/15 2304     Chief Complaint  Patient presents with  . Shortness of Breath  . Cough     (Consider location/radiation/quality/duration/timing/severity/associated sxs/prior Treatment) HPI   BROOKELYN GAYNOR is a 50 y.o. female with no significant past medical history presenting today with viral URI like symptoms for the past 2 weeks. She states she has had a worsening cough and admits to being a chronic smoker. It is productive of green sputum. She has associated chest pain and sore throat with this cough. She describes worsening shortness of breath. She's had subjective fevers. She denies any sick contacts.  Patient has no further complaints. She has tried over-the-counter remedies which have not helped. This is her first time seeking medical care.  10 Systems reviewed and are negative for acute change except as noted in the HPI.     History reviewed. No pertinent past medical history. Past Surgical History  Procedure Laterality Date  . Cholecystectomy    . Cesarean section    . Tubal ligation     History reviewed. No pertinent family history. Social History  Substance Use Topics  . Smoking status: Current Every Day Smoker -- 1.00 packs/day for 30 years    Types: Cigarettes  . Smokeless tobacco: None  . Alcohol Use: Yes     Comment: occasionally   OB History    No data available     Review of Systems    Allergies  Penicillins  Home Medications   Prior to Admission medications   Medication Sig Start Date End Date Taking? Authorizing Provider  acetaminophen (TYLENOL) 500 MG tablet Take 1,000 mg by mouth every 6 (six) hours as needed (pain).     Historical Provider, MD  HYDROcodone-acetaminophen (NORCO/VICODIN) 5-325 MG per tablet Take 1-2 tablets by mouth every 4 (four) hours as needed. 02/19/14   Trixie Dredge, PA-C   BP 113/82 mmHg  Pulse 94  Temp(Src) 98.1 F  (36.7 C) (Oral)  Resp 20  Ht 5\' 3"  (1.6 m)  Wt 140 lb (63.504 kg)  BMI 24.81 kg/m2  SpO2 98%  LMP 12/12/2011 Physical Exam  Constitutional: She is oriented to person, place, and time. She appears well-developed and well-nourished. No distress.  HENT:  Head: Normocephalic and atraumatic.  Nose: Nose normal.  Mouth/Throat: Oropharynx is clear and moist. No oropharyngeal exudate.  Eyes: Conjunctivae and EOM are normal. Pupils are equal, round, and reactive to light. No scleral icterus.  Neck: Normal range of motion. Neck supple. No JVD present. No tracheal deviation present. No thyromegaly present.  Cardiovascular: Normal rate, regular rhythm and normal heart sounds.  Exam reveals no gallop and no friction rub.   No murmur heard. Pulmonary/Chest: Effort normal and breath sounds normal. No respiratory distress. She has no wheezes. She exhibits no tenderness.  Abdominal: Soft. Bowel sounds are normal. She exhibits no distension and no mass. There is no tenderness. There is no rebound and no guarding.  Musculoskeletal: Normal range of motion. She exhibits no edema or tenderness.  Lymphadenopathy:    She has no cervical adenopathy.  Neurological: She is alert and oriented to person, place, and time. No cranial nerve deficit. She exhibits normal muscle tone.  Skin: Skin is warm and dry. No rash noted. No erythema. No pallor.  Nursing note and vitals reviewed.   ED Course  Procedures (including critical care time) Labs Review  Labs Reviewed - No data to display  Imaging Review Dg Chest 2 View  01/01/2016  CLINICAL DATA:  Shortness of breath and chest pain. Cough and congestion. EXAM: CHEST  2 VIEW COMPARISON:  06/05/2012 FINDINGS: Patchy lingular opacity concerning for pneumonia. Lungs are hyperinflated with diffuse bronchial thickening. The heart size is normal. There is no pleural effusion or pneumothorax. No acute osseous abnormalities are seen. IMPRESSION: Patchy lingular opacity  concerning for pneumonia. Followup PA and lateral chest X-ray is recommended in 3-4 weeks following trial of antibiotic therapy to ensure resolution and exclude underlying malignancy. Background bronchial thickening and hyperinflation, progressed from prior exam. Electronically Signed   By: Rubye OaksMelanie  Ehinger M.D.   On: 01/01/2016 00:23   I have personally reviewed and evaluated these images and lab results as part of my medical decision-making.   EKG Interpretation None      MDM   Final diagnoses:  None   patient presents to the emergency department for respiratory symptoms for the past 2 weeks. Will obtain chest x-ray for evaluation. Patient given DuoNeb and Motrin for her cough and sore throat. Also given IM Decadron for pharyngitis.  Chest x-ray does show a pneumonia. She was treated with azithromycin emergency department. We'll discharge with 4 more days of antibiotics and primary care follow-up. I was called to the room for acute respiratory distress. Upon my arrival, patient was not hypoxic, there is no evidence of cyanosis. I do not believe O2 sat of 80% in the nursing notes was accurate as there was a poor waveform. She is currently greater than 95% on room air without any significant interventions. She was counseled on smoking cessation. She appears well and in no acute distress. She is advised cough may last several months. Vital signs remain within her normal limits and she is safe for discharge.  Tomasita CrumbleAdeleke Zae Kirtz, MD 01/01/16 [phone]

## 2016-01-01 MED ORDER — AZITHROMYCIN 500 MG IV SOLR
INTRAVENOUS | Status: AC
  Filled 2016-01-01: qty 500

## 2016-01-01 MED ORDER — DEXTROSE 5 % IV SOLN
500.0000 mg | Freq: Once | INTRAVENOUS | Status: AC
Start: 2016-01-01 — End: 2016-01-01
  Administered 2016-01-01: 500 mg via INTRAVENOUS

## 2016-01-01 MED ORDER — BENZONATATE 100 MG PO CAPS
200.0000 mg | ORAL_CAPSULE | Freq: Once | ORAL | Status: AC
  Administered 2016-01-01: 200 mg via ORAL
  Filled 2016-01-01: qty 2

## 2016-01-01 MED ORDER — AZITHROMYCIN 250 MG PO TABS: 250.0000 mg | ORAL_TABLET | Freq: Every day | ORAL | Status: DC

## 2016-01-01 MED ORDER — ALBUTEROL SULFATE HFA 108 (90 BASE) MCG/ACT IN AERS
2.0000 | INHALATION_SPRAY | Freq: Once | RESPIRATORY_TRACT | Status: AC
  Administered 2016-01-01: 2 via RESPIRATORY_TRACT
  Filled 2016-01-01: qty 6.7

## 2016-01-01 NOTE — Discharge Instructions (Signed)
Community-Acquired Pneumonia, Adult Linda Foley, you need to quit smoking, this will kill you.  Your chest xray shows pneumonia, take antibiotics for 5 days and see a primary care doctor within 3 days for close follow up. If symptoms worsen, come back to the ED immediately.  Thank you. Pneumonia is an infection of the lungs. One type of pneumonia can happen while a person is in a hospital. A different type can happen when a person is not in a hospital (community-acquired pneumonia). It is easy for this kind to spread from person to person. It can spread to you if you breathe near an infected person who coughs or sneezes. Some symptoms include:  A dry cough.  A wet (productive) cough.  Fever.  Sweating.  Chest pain. HOME CARE  Take over-the-counter and prescription medicines only as told by your doctor.  Only take cough medicine if you are losing sleep.  If you were prescribed an antibiotic medicine, take it as told by your doctor. Do not stop taking the antibiotic even if you start to feel better.  Sleep with your head and neck raised (elevated). You can do this by putting a few pillows under your head, or you can sleep in a recliner.  Do not use tobacco products. These include cigarettes, chewing tobacco, and e-cigarettes. If you need help quitting, ask your doctor.  Drink enough water to keep your pee (urine) clear or pale yellow. A shot (vaccine) can help prevent pneumonia. Shots are often suggested for:  People older than 49 years of age.  People older than 49 years of age:  Who are having cancer treatment.  Who have long-term (chronic) lung disease.  Who have problems with their body's defense system (immune system). You may also prevent pneumonia if you take these actions:  Get the flu (influenza) shot every year.  Go to the dentist as often as told.  Wash your hands often. If soap and water are not available, use hand sanitizer. GET HELP IF:  You have a  fever.  You lose sleep because your cough medicine does not help. GET HELP RIGHT AWAY IF:  You are short of breath and it gets worse.  You have more chest pain.  Your sickness gets worse. This is very serious if:  You are an older adult.  Your body's defense system is weak.  You cough up blood.   This information is not intended to replace advice given to you by your health care provider. Make sure you discuss any questions you have with your health care provider.   Document Released: 05/27/2008 Document Revised: 08/30/2015 Document Reviewed: 04/05/2015 Elsevier Interactive Patient Education 2016 [employer]. You Can Quit Smoking If you are ready to quit smoking or are thinking about it, congratulations! You have chosen to help yourself be healthier and live longer! There are lots of different ways to quit smoking. Nicotine gum, nicotine patches, a nicotine inhaler, or nicotine nasal spray can help with physical craving. Hypnosis, support groups, and medicines help break the habit of smoking. TIPS TO GET OFF AND STAY OFF CIGARETTES  Learn to predict your moods. Do not let a bad situation be your excuse to have a cigarette. Some situations in your life might tempt you to have a cigarette.  Ask friends and co-workers not to smoke around you.  Make your home smoke-free.  Never have "just one" cigarette. It leads to wanting another and another. Remind yourself of your decision to quit.  On a card,  make a list of your reasons for not smoking. Read it at least the same number of times a day as you have a cigarette. Tell yourself everyday, "I do not want to smoke. I choose not to smoke."  Ask someone at home or work to help you with your plan to quit smoking.  Have something planned after you eat or have a cup of coffee. Take a walk or get other exercise to perk you up. This will help to keep you from overeating.  Try a relaxation exercise to calm you down and decrease your stress.  Remember, you may be tense and nervous the first two weeks after you quit. This will pass.  Find new activities to keep your hands busy. Play with a pen, coin, or rubber band. Doodle or draw things on paper.  Brush your teeth right after eating. This will help cut down the craving for the taste of tobacco after meals. You can try mouthwash too.  Try gum, breath mints, or diet candy to keep something in your mouth. IF YOU SMOKE AND WANT TO QUIT:  Do not stock up on cigarettes. Never buy a carton. Wait until one pack is finished before you buy another.  Never carry cigarettes with you at work or at home.  Keep cigarettes as far away from you as possible. Leave them with someone else.  Never carry matches or a lighter with you.  Ask yourself, "Do I need this cigarette or is this just a reflex?"  Bet with someone that you can quit. Put cigarette money in a piggy bank every morning. If you smoke, you give up the money. If you do not smoke, by the end of the week, you keep the money.  Keep trying. It takes 21 days to change a habit!  Talk to your doctor about using medicines to help you quit. These include nicotine replacement gum, lozenges, or skin patches.   This information is not intended to replace advice given to you by your health care provider. Make sure you discuss any questions you have with your health care provider.   Document Released: 10/05/2009 Document Revised: 03/02/2012 Document Reviewed: 10/05/2009 Elsevier Interactive Patient Education [employer] Else[employer] Inc.

## 2016-01-01 NOTE — ED Notes (Signed)
Pt c/o sudden onset of coughing, shortness of breath - in to assess patient - EDP called to bedside, O2 sat 80% - pt placed on [place] @ 3lpm - sats up to 100% - pt reassured, encouraged to slow down breathing, compliant. RT @ bedside for neb treatment.

## 2016-01-01 NOTE — ED Notes (Signed)
Discharge delay due to patient receiving IV antibiotics.

## 2016-10-10 ENCOUNTER — Emergency Department (HOSPITAL_BASED_OUTPATIENT_CLINIC_OR_DEPARTMENT_OTHER): Payer: Self-pay

## 2016-10-10 ENCOUNTER — Encounter (HOSPITAL_BASED_OUTPATIENT_CLINIC_OR_DEPARTMENT_OTHER): Payer: Self-pay | Admitting: *Deleted

## 2016-10-10 ENCOUNTER — Emergency Department (HOSPITAL_BASED_OUTPATIENT_CLINIC_OR_DEPARTMENT_OTHER)
Admission: EM | Admit: 2016-10-10 | Discharge: 2016-10-10 | Disposition: A | Discharge status: Home / Self Care | Payer: Self-pay | Attending: Emergency Medicine | Admitting: Emergency Medicine

## 2016-10-10 DIAGNOSIS — F17210 Nicotine dependence, cigarettes, uncomplicated: Secondary | ICD-10-CM | POA: Insufficient documentation

## 2016-10-10 DIAGNOSIS — J411 Mucopurulent chronic bronchitis: Secondary | ICD-10-CM | POA: Insufficient documentation

## 2016-10-10 MED ORDER — HYDROCODONE-HOMATROPINE 5-1.5 MG/5ML PO SYRP: 5.0000 mL | ORAL_SOLUTION | Freq: Four times a day (QID) | ORAL | 0 refills | Status: DC | PRN

## 2016-10-10 MED ORDER — IPRATROPIUM BROMIDE 0.02 % IN SOLN
0.5000 mg | Freq: Once | RESPIRATORY_TRACT | Status: AC
  Administered 2016-10-10: 0.5 mg via RESPIRATORY_TRACT
  Filled 2016-10-10: qty 2.5

## 2016-10-10 MED ORDER — ALBUTEROL SULFATE (2.5 MG/3ML) 0.083% IN NEBU
5.0000 mg | INHALATION_SOLUTION | Freq: Once | RESPIRATORY_TRACT | Status: AC
  Administered 2016-10-10: 5 mg via RESPIRATORY_TRACT
  Filled 2016-10-10: qty 6

## 2016-10-10 MED ORDER — ALBUTEROL SULFATE HFA 108 (90 BASE) MCG/ACT IN AERS: 1.0000 | INHALATION_SPRAY | Freq: Four times a day (QID) | RESPIRATORY_TRACT | 0 refills | Status: DC | PRN

## 2016-10-10 MED ORDER — DOXYCYCLINE HYCLATE 100 MG PO CAPS: 100.0000 mg | ORAL_CAPSULE | Freq: Two times a day (BID) | ORAL | 0 refills | Status: DC

## 2016-10-10 NOTE — ED Triage Notes (Signed)
Cough x 1 year. Pt is heavy smoker. C/o chest pain since last night with sharp pains in her back

## 2016-10-10 NOTE — ED Provider Notes (Signed)
[facility]   CSN: [id number] Arrival date & time: 10/10/16  1216     History   Chief Complaint Chief Complaint  Patient presents with  . Chest Pain    HPI Linda Foley is a 50 y.o. female.  HPI Patient presents with intermittent cough with yellowish-green sputum off and on for the last several months.  She has sharp chest pain when she coughs.  Denies fever chills.  Patient is a smoker and has been diagnosed with COPD in the past.  She is out of her inhaler.  She has had increased wheezing lately. History reviewed. No pertinent past medical history.  There are no active problems to display for this patient.   Past Surgical History:  Procedure Laterality Date  . CESAREAN SECTION    . CHOLECYSTECTOMY    . TUBAL LIGATION      OB History    No data available       Home Medications    Prior to Admission medications   Medication Sig Start Date End Date Taking? Authorizing Provider  acetaminophen (TYLENOL) 500 MG tablet Take 1,000 mg by mouth every 6 (six) hours as needed (pain).     Historical Provider, MD  albuterol (PROVENTIL HFA;VENTOLIN HFA) 108 (90 Base) MCG/ACT inhaler Inhale 1-2 puffs into the lungs every 6 (six) hours as needed for wheezing or shortness of breath. 10/10/16   Nelva Nayobert Temprance Wyre, MD  azithromycin (ZITHROMAX) 250 MG tablet Take 1 tablet (250 mg total) by mouth daily. 01/01/16   Tomasita CrumbleAdeleke Oni, MD  doxycycline (VIBRAMYCIN) 100 MG capsule Take 1 capsule (100 mg total) by mouth 2 (two) times daily. 10/10/16   Nelva Nayobert Jeramy Dimmick, MD  HYDROcodone-homatropine [facility](HYCODAN) 5-1.5 MG/5ML syrup Take 5 mLs by mouth every 6 (six) hours as needed for cough. 10/10/16   Nelva Nayobert Deitrich Steve, MD    Family History No family history on file.  Social History Social History  Substance Use Topics  . Smoking status: Heavy Tobacco Smoker    Packs/day: 2.00    Years: 30.00    Types: Cigarettes  . Smokeless tobacco: Never Used  . Alcohol use No     Comment:  former     Allergies   Penicillins   Review of Systems Review of Systems All other systems reviewed and are negative  Physical Exam Updated Vital Signs BP 115/78   Pulse 77   Temp 97.8 F (36.6 C) (Oral)   Resp 18   Ht 5\' 3"  (1.6 m)   Wt 140 lb (63.5 kg)   LMP 12/12/2011   SpO2 94%   BMI 24.80 kg/m   Physical Exam Physical Exam  Nursing note and vitals reviewed. Constitutional: She is oriented to person, place, and time. She appears well-developed and well-nourished. No distress.  HENT:  Head: Normocephalic and atraumatic.  Eyes: Pupils are equal, round, and reactive to light.  Neck: Normal range of motion.  Cardiovascular: Normal rate and intact distal pulses.   Pulmonary/Chest: No respiratory distress.  patient has expiratory wheezes which are present to auscultation on the chest.  No retractions.  No use of accessory muscles. Abdominal: Normal appearance. She exhibits no distension.  Musculoskeletal: Normal range of motion.  Neurological: She is alert and oriented to person, place, and time. No cranial nerve deficit.  Skin: Skin is warm and dry. No rash noted.  Psychiatric: She has a normal mood and affect. Her behavior is normal.    ED Treatments / Results  Labs (all labs ordered  are listed, but only abnormal results are displayed) Labs Reviewed - No data to display  EKG  EKG Interpretation  Date/Time:  Thursday October 10 2016 12:26:47 EDT Ventricular Rate:  77 PR Interval:  166 QRS Duration: 82 QT Interval:  404 QTC Calculation: 457 R Axis:   84 Text Interpretation:  Normal sinus rhythm Normal ECG Confirmed by Tasheka Houseman  MD, Jaylenne Hamelin ([id number]) on 10/10/2016 12:41:37 PM       Radiology Dg Chest 2 View  Result Date: 10/10/2016 CLINICAL DATA:  Cough and congestion for 2 days.  Smoking history. EXAM: CHEST  2 VIEW COMPARISON:  12/31/2015 FINDINGS: Normal heart size and mediastinal contours. No acute infiltrate or edema. Resolved lingular opacity. No  effusion or pneumothorax. No acute osseous findings. IMPRESSION: No active cardiopulmonary disease. Electronically Signed   By: Marnee Spring M.D.   On: 10/10/2016 13:31    Procedures Procedures (including critical care time)  Medications Ordered in ED Medications  albuterol (PROVENTIL) (2.5 MG/3ML) 0.083% nebulizer solution 5 mg (5 mg Nebulization Given 10/10/16 1325)  ipratropium (ATROVENT) nebulizer solution 0.5 mg (0.5 mg Nebulization Given 10/10/16 1324)     Initial Impression / Assessment and Plan / ED Course  I have reviewed the triage vital signs and the nursing notes.  Pertinent labs & imaging results that were available during my care of the patient were reviewed by me and considered in my medical decision making (see chart for details).  Clinical Course      Final Clinical Impressions(s) / ED Diagnoses   Final diagnoses:  Mucopurulent chronic bronchitis (HCC)    New Prescriptions New Prescriptions   ALBUTEROL (PROVENTIL HFA;VENTOLIN HFA) 108 (90 BASE) MCG/ACT INHALER    Inhale 1-2 puffs into the lungs every 6 (six) hours as needed for wheezing or shortness of breath.   DOXYCYCLINE (VIBRAMYCIN) 100 MG CAPSULE    Take 1 capsule (100 mg total) by mouth 2 (two) times daily.   HYDROCODONE-HOMATROPINE (HYCODAN) 5-1.5 MG/5ML SYRUP    Take 5 mLs by mouth every 6 (six) hours as needed for cough.     Nelva Nay, MD 10/10/16 1344

## 2016-10-15 ENCOUNTER — Emergency Department (HOSPITAL_COMMUNITY): Payer: Self-pay

## 2016-10-15 ENCOUNTER — Emergency Department (HOSPITAL_COMMUNITY)
Admission: EM | Admit: 2016-10-15 | Discharge: 2016-10-15 | Disposition: A | Discharge status: Home / Self Care | Payer: Self-pay | Attending: Emergency Medicine | Admitting: Emergency Medicine

## 2016-10-15 ENCOUNTER — Encounter (HOSPITAL_COMMUNITY): Payer: Self-pay

## 2016-10-15 DIAGNOSIS — R1013 Epigastric pain: Secondary | ICD-10-CM | POA: Insufficient documentation

## 2016-10-15 DIAGNOSIS — F17210 Nicotine dependence, cigarettes, uncomplicated: Secondary | ICD-10-CM | POA: Insufficient documentation

## 2016-10-15 DIAGNOSIS — M791 Myalgia, unspecified site: Secondary | ICD-10-CM

## 2016-10-15 DIAGNOSIS — R0789 Other chest pain: Secondary | ICD-10-CM | POA: Insufficient documentation

## 2016-10-15 DIAGNOSIS — N390 Urinary tract infection, site not specified: Secondary | ICD-10-CM | POA: Insufficient documentation

## 2016-10-15 DIAGNOSIS — R0602 Shortness of breath: Secondary | ICD-10-CM | POA: Insufficient documentation

## 2016-10-15 DIAGNOSIS — F1290 Cannabis use, unspecified, uncomplicated: Secondary | ICD-10-CM | POA: Insufficient documentation

## 2016-10-15 DIAGNOSIS — J449 Chronic obstructive pulmonary disease, unspecified: Secondary | ICD-10-CM | POA: Insufficient documentation

## 2016-10-15 DIAGNOSIS — Z79899 Other long term (current) drug therapy: Secondary | ICD-10-CM | POA: Insufficient documentation

## 2016-10-15 DIAGNOSIS — R197 Diarrhea, unspecified: Secondary | ICD-10-CM | POA: Insufficient documentation

## 2016-10-15 HISTORY — DX: Anxiety disorder, unspecified: F41.9

## 2016-10-15 HISTORY — DX: Chronic obstructive pulmonary disease, unspecified: J44.9

## 2016-10-15 LAB — COMPREHENSIVE METABOLIC PANEL
ALBUMIN: 3.8 g/dL (ref 3.5–5.0)
ALK PHOS: 74 U/L (ref 38–126)
ALT: 25 U/L (ref 14–54)
ANION GAP: 9 (ref 5–15)
AST: 27 U/L (ref 15–41)
BILIRUBIN TOTAL: 1.5 mg/dL — AB (ref 0.3–1.2)
BUN: 9 mg/dL (ref 6–20)
CALCIUM: 9 mg/dL (ref 8.9–10.3)
CO2: 21 mmol/L — AB (ref 22–32)
CREATININE: 0.65 mg/dL (ref 0.44–1.00)
Chloride: 104 mmol/L (ref 101–111)
GFR calc Af Amer: >60 mL/min (ref >60)
GFR calc non Af Amer: >60 mL/min (ref >60)
GLUCOSE: 108 mg/dL — AB (ref 65–99)
Potassium: 4.6 mmol/L (ref 3.5–5.1)
SODIUM: 134 mmol/L — AB (ref 135–145)
TOTAL PROTEIN: 8 g/dL (ref 6.5–8.1)

## 2016-10-15 LAB — CBC WITH DIFFERENTIAL/PLATELET
BASOS ABS: 0 10*3/uL (ref 0.0–0.1)
BASOS PCT: 0 %
EOS ABS: 0 10*3/uL (ref 0.0–0.7)
Eosinophils Relative: 0 %
HEMATOCRIT: 45.7 % (ref 36.0–46.0)
HEMOGLOBIN: 15.1 g/dL — AB (ref 12.0–15.0)
LYMPHS PCT: 8 %
Lymphs Abs: 1.6 10*3/uL (ref 0.7–4.0)
MCH: 30.1 pg (ref 26.0–34.0)
MCHC: 33 g/dL (ref 30.0–36.0)
MCV: 91 fL (ref 78.0–100.0)
MONOS PCT: 11 %
Monocytes Absolute: 2.2 10*3/uL — ABNORMAL HIGH (ref 0.1–1.0)
NEUTROS ABS: 16.6 10*3/uL — AB (ref 1.7–7.7)
NEUTROS PCT: 81 %
Platelets: 247 10*3/uL (ref 150–400)
RBC: 5.02 MIL/uL (ref 3.87–5.11)
RDW: 14.3 % (ref 11.5–15.5)
WBC MORPHOLOGY: INCREASED
WBC: 20.4 10*3/uL — ABNORMAL HIGH (ref 4.0–10.5)

## 2016-10-15 LAB — URINALYSIS, ROUTINE W REFLEX MICROSCOPIC
BILIRUBIN URINE: NEGATIVE
GLUCOSE, UA: NEGATIVE mg/dL
KETONES UR: NEGATIVE mg/dL
Nitrite: POSITIVE — AB
PH: 7 (ref 5.0–8.0)
Protein, ur: NEGATIVE mg/dL
SPECIFIC GRAVITY, URINE: 1.015 (ref 1.005–1.030)

## 2016-10-15 LAB — URINE MICROSCOPIC-ADD ON

## 2016-10-15 LAB — WET PREP, GENITAL
CLUE CELLS WET PREP: NONE SEEN
SPERM: NONE SEEN
TRICH WET PREP: NONE SEEN
YEAST WET PREP: NONE SEEN

## 2016-10-15 LAB — I-STAT CG4 LACTIC ACID, ED: LACTIC ACID, VENOUS: 0.52 mmol/L (ref 0.5–1.9)

## 2016-10-15 LAB — LIPASE, BLOOD: Lipase: 19 U/L (ref 11–51)

## 2016-10-15 MED ORDER — PROMETHAZINE HCL 12.5 MG PO TABS: 12.5000 mg | ORAL_TABLET | Freq: Four times a day (QID) | ORAL | 0 refills | Status: DC | PRN

## 2016-10-15 MED ORDER — CEPHALEXIN 500 MG PO CAPS: 500.0000 mg | ORAL_CAPSULE | Freq: Four times a day (QID) | ORAL | 0 refills | Status: DC

## 2016-10-15 MED ORDER — SODIUM CHLORIDE 0.9 % IV BOLUS (SEPSIS)
1000.0000 mL | Freq: Once | INTRAVENOUS | Status: AC
  Administered 2016-10-15: 1000 mL via INTRAVENOUS

## 2016-10-15 MED ORDER — DEXTROSE 5 % IV SOLN
1.0000 g | Freq: Once | INTRAVENOUS | Status: AC
  Administered 2016-10-15: 1 g via INTRAVENOUS
  Filled 2016-10-15: qty 10

## 2016-10-15 MED ORDER — KETOROLAC TROMETHAMINE 30 MG/ML IJ SOLN
30.0000 mg | Freq: Once | INTRAMUSCULAR | Status: AC
  Administered 2016-10-15: 30 mg via INTRAVENOUS
  Filled 2016-10-15: qty 1

## 2016-10-15 MED ORDER — ACETAMINOPHEN 325 MG PO TABS
650.0000 mg | ORAL_TABLET | Freq: Once | ORAL | Status: AC
  Administered 2016-10-15: 650 mg via ORAL
  Filled 2016-10-15: qty 2

## 2016-10-15 NOTE — ED Provider Notes (Signed)
[facility]-EMERGENCY DEPT Provider Note   CSN: [id number] Arrival date & time: 10/15/16  1138     History   Chief Complaint Chief Complaint  Patient presents with  . Influenza    HPI Linda Foley is a 50 y.o. female.  HPI Linda Foley is a 50 y.o. female with hx of COPD and anxiety, presents to ED with complaint of Body aches, chills, cough, congestion, ear pain, nausea, vomiting. Symptoms started yesterday. Patient was actually seen 5 days ago for cough, diagnosed with bronchitis. She did not take her antibiotics or any medications because she could not afford them. She did not take any tylenol or motrin for fever or body aches at home. Did not check her temp at home. Denies abdominal pain. No urinary symptoms. States brother in law has pneumonia and she is concerned she may have the same.   Past Medical History:  Diagnosis Date  . Anxiety   . COPD (chronic obstructive pulmonary disease) (HCC)     There are no active problems to display for this patient.   Past Surgical History:  Procedure Laterality Date  . CESAREAN SECTION    . CHOLECYSTECTOMY    . TUBAL LIGATION      OB History    No data available       Home Medications    Prior to Admission medications   Medication Sig Start Date End Date Taking? Authorizing Provider  acetaminophen (TYLENOL) 500 MG tablet Take 1,000 mg by mouth every 6 (six) hours as needed (pain).    Yes Historical Provider, MD  albuterol (PROVENTIL HFA;VENTOLIN HFA) 108 (90 Base) MCG/ACT inhaler Inhale 1-2 puffs into the lungs every 6 (six) hours as needed for wheezing or shortness of breath. 10/10/16  Yes Nelva Nay, MD  azithromycin (ZITHROMAX) 250 MG tablet Take 1 tablet (250 mg total) by mouth daily. Patient not taking: Reported on 10/15/2016 01/01/16   Tomasita Crumble, MD  doxycycline (VIBRAMYCIN) 100 MG capsule Take 1 capsule (100 mg total) by mouth 2 (two) times daily. Patient not taking: Reported on 10/15/2016 10/10/16   Nelva Nay, MD  HYDROcodone-homatropine [facility]) 5-1.5 MG/5ML syrup Take 5 mLs by mouth every 6 (six) hours as needed for cough. Patient not taking: Reported on 10/15/2016 10/10/16   Nelva Nay, MD    Family History History reviewed. No pertinent family history.  Social History Social History  Substance Use Topics  . Smoking status: Heavy Tobacco Smoker    Packs/day: 2.00    Years: 30.00    Types: Cigarettes  . Smokeless tobacco: Never Used  . Alcohol use No     Comment: former     Allergies   Penicillins   Review of Systems Review of Systems  Constitutional: Positive for chills and fever.  HENT: Positive for congestion. Negative for sore throat.   Respiratory: Positive for cough, chest tightness and shortness of breath.   Cardiovascular: Negative for chest pain, palpitations and leg swelling.  Gastrointestinal: Positive for diarrhea, nausea and vomiting. Negative for abdominal pain.  Genitourinary: Negative for dysuria, flank pain, pelvic pain, vaginal bleeding, vaginal discharge and vaginal pain.  Musculoskeletal: Positive for myalgias. Negative for arthralgias, neck pain and neck stiffness.  Skin: Negative for rash.  Neurological: Positive for headaches. Negative for dizziness and weakness.  All other systems reviewed and are negative.    Physical Exam Updated Vital Signs BP 116/82 (BP Location: Left Arm)   Pulse 99   Temp 98.9 F (37.2 C) (Oral)  Resp 20   Ht 5\' 3"  (1.6 m)   Wt 59.9 kg   LMP 12/12/2011   SpO2 96%   BMI 23.38 kg/m   Physical Exam  Constitutional: She appears well-developed and well-nourished. No distress.  HENT:  Head: Normocephalic.  Nose: Nose normal.  Mouth/Throat: Oropharynx is clear and moist.  Eyes: Conjunctivae are normal.  Neck: Normal range of motion. Neck supple.  No meningismus  Cardiovascular: Normal rate, regular rhythm and normal heart sounds.   Pulmonary/Chest: Effort normal and breath sounds normal. No respiratory  distress. She has no wheezes. She has no rales.  Abdominal: Soft. Bowel sounds are normal. She exhibits no distension. There is tenderness. There is no rebound.  Epigastric tenderness  Musculoskeletal: She exhibits no edema.  Neurological: She is alert.  Skin: Skin is warm and dry. Capillary refill takes less than 2 seconds.  Psychiatric: She has a normal mood and affect. Her behavior is normal.  Nursing note and vitals reviewed.    ED Treatments / Results  Labs (all labs ordered are listed, but only abnormal results are displayed) Labs Reviewed  WET PREP, GENITAL - Abnormal; Notable for the following:       Result Value   WBC, Wet Prep HPF POC RARE (*)    All other components within normal limits  CBC WITH DIFFERENTIAL/PLATELET - Abnormal; Notable for the following:    WBC 20.4 (*)    Hemoglobin 15.1 (*)    Neutro Abs 16.6 (*)    Monocytes Absolute 2.2 (*)    All other components within normal limits  COMPREHENSIVE METABOLIC PANEL - Abnormal; Notable for the following:    Sodium 134 (*)    CO2 21 (*)    Glucose, Bld 108 (*)    Total Bilirubin 1.5 (*)    All other components within normal limits  URINALYSIS, ROUTINE W REFLEX MICROSCOPIC (NOT AT [facility]) - Abnormal; Notable for the following:    APPearance CLOUDY (*)    Hgb urine dipstick SMALL (*)    Nitrite POSITIVE (*)    Leukocytes, UA TRACE (*)    All other components within normal limits  URINE MICROSCOPIC-ADD ON - Abnormal; Notable for the following:    Squamous Epithelial / LPF 0-5 (*)    Bacteria, UA MANY (*)    All other components within normal limits  URINE CULTURE  LIPASE, BLOOD  I-STAT CG4 LACTIC ACID, ED  GC/CHLAMYDIA PROBE AMP ([place]) NOT AT [facility]    EKG  EKG Interpretation None       Radiology Dg Chest 2 View  Result Date: 10/15/2016 CLINICAL DATA:  Shortness of breath and cough EXAM: CHEST  2 VIEW COMPARISON:  10/10/2016 FINDINGS: Normal heart size and mediastinal contours. No acute  infiltrate or edema. No effusion or pneumothorax. No acute osseous findings. IMPRESSION: Negative chest Electronically Signed   By: Marnee Spring M.D.   On: 10/15/2016 12:58    Procedures Procedures (including critical care time)  Medications Ordered in ED Medications  sodium chloride 0.9 % bolus 1,000 mL (not administered)  acetaminophen (TYLENOL) tablet 650 mg (650 mg Oral Given 10/15/16 1233)     Initial Impression / Assessment and Plan / ED Course  I have reviewed the triage vital signs and the nursing notes.  Pertinent labs & imaging results that were available during my care of the patient were reviewed by me and considered in my medical decision making (see chart for details).  Clinical Course    Patient seen  and examined. Patient with urinary symptoms, cough, nausea, vomiting, body aches and chills. Onset yesterday. She is afebrile here. Will check labs given abdominal tenderness on exam, will do chest x-ray, Tylenol and fluids ordered.  Patient's urinalysis showing infection. Rocephin ordered through IV. Patient's white blood cell count is 20.4. Otherwise unremarkable labs. Abdomen benign, no guarding, rebound tenderness, abdomen is soft. She does have some epigastric tenderness, but believes is from vomiting. We'll continue hydration, will do a pelvic exam, patient feels better and asking for food.  Pelvic exam and wet prep with no significant abnormalities. Patient is drinking and eating sandwich and crackers. No vomiting or nausea. Vital signs are normal. Lactic acid checked and is normal. Most likely UTI, possibly viral illness, will discharge home on Keflex, continue Tylenol and Motrin for body aches and fever, follow-up as needed. I did tell her to be rechecked by her PCP in 2-3 days. Return precautions discussed.   Vitals:   10/15/16 1236 10/15/16 1500 10/15/16 1531 10/15/16 1636  BP:  110/61 108/67 109/66  Pulse:  81 75 76  Resp:  17  15  Temp: 100.9 F (38.3 C)  97.5 F (36.4 C)  98.3 F (36.8 C)  TempSrc: Rectal Oral  Oral  SpO2:  97% 97% 95%  Weight:      Height:         Final Clinical Impressions(s) / ED Diagnoses   Final diagnoses:  Urinary tract infection without hematuria, site unspecified  Myalgia    New Prescriptions New Prescriptions   No medications on file     Jaynie Crumbleatyana Kerrington Sova, PA-C 10/16/16 [id number]    Lyndal Pulleyaniel Knott, MD 10/16/16 [phone]

## 2016-10-15 NOTE — ED Triage Notes (Signed)
PT C/O HEADACHE, BODY ACHES, PRODUCTIVE  COUGH, NAUSEA, CHILLS, AND DIARRHEA SINCE YESTERDAY.

## 2016-10-15 NOTE — ED Notes (Signed)
Pt verbalizes understanding of discharge information. NAD noted. Vitals stable. Pt denies wheelchair for discharge.

## 2016-10-15 NOTE — Discharge Instructions (Signed)
Take Keflex as prescribed until all gone. Take ibuprofen and Tylenol for fever and body aches. Take Phenergan as prescribed as needed for nausea and vomiting. Follow with primary care doctor. Return if worsening.

## 2016-10-16 LAB — GC/CHLAMYDIA PROBE AMP (~~LOC~~) NOT AT ARMC
CHLAMYDIA, DNA PROBE: NEGATIVE
NEISSERIA GONORRHEA: NEGATIVE

## 2016-10-16 MED FILL — CEPHALEXIN 500 MG CAPSULE: 500 | 10 days supply | Qty: 40 | Fill #0

## 2016-10-16 MED FILL — HYDROCODONE-HOMATROPINE SOL: 5-1.5 | 5 days supply | Qty: 120 | Fill #0

## 2016-10-16 MED FILL — PROVENTIL HFA 90 MCG INH: 108 (90 BAS | 17 days supply | Qty: 7 | Fill #0

## 2016-10-17 LAB — URINE CULTURE: Culture: >=100000 — AB

## 2016-11-26 ENCOUNTER — Emergency Department (HOSPITAL_COMMUNITY): Admission: EM | Admit: 2016-11-26 | Discharge: 2016-11-26 | Discharge status: Absent without leave | Payer: Self-pay

## 2016-11-27 ENCOUNTER — Emergency Department (HOSPITAL_BASED_OUTPATIENT_CLINIC_OR_DEPARTMENT_OTHER): Payer: Self-pay

## 2016-11-27 ENCOUNTER — Emergency Department (HOSPITAL_BASED_OUTPATIENT_CLINIC_OR_DEPARTMENT_OTHER)
Admission: EM | Admit: 2016-11-27 | Discharge: 2016-11-27 | Disposition: A | Discharge status: Home / Self Care | Payer: Self-pay | Attending: Emergency Medicine | Admitting: Emergency Medicine

## 2016-11-27 ENCOUNTER — Encounter (HOSPITAL_BASED_OUTPATIENT_CLINIC_OR_DEPARTMENT_OTHER): Payer: Self-pay | Admitting: Emergency Medicine

## 2016-11-27 DIAGNOSIS — F17210 Nicotine dependence, cigarettes, uncomplicated: Secondary | ICD-10-CM | POA: Insufficient documentation

## 2016-11-27 DIAGNOSIS — Z79899 Other long term (current) drug therapy: Secondary | ICD-10-CM | POA: Insufficient documentation

## 2016-11-27 DIAGNOSIS — Y999 Unspecified external cause status: Secondary | ICD-10-CM | POA: Insufficient documentation

## 2016-11-27 DIAGNOSIS — W231XXA Caught, crushed, jammed, or pinched between stationary objects, initial encounter: Secondary | ICD-10-CM | POA: Insufficient documentation

## 2016-11-27 DIAGNOSIS — Y929 Unspecified place or not applicable: Secondary | ICD-10-CM | POA: Insufficient documentation

## 2016-11-27 DIAGNOSIS — Y9389 Activity, other specified: Secondary | ICD-10-CM | POA: Insufficient documentation

## 2016-11-27 DIAGNOSIS — J449 Chronic obstructive pulmonary disease, unspecified: Secondary | ICD-10-CM | POA: Insufficient documentation

## 2016-11-27 DIAGNOSIS — S60012A Contusion of left thumb without damage to nail, initial encounter: Secondary | ICD-10-CM | POA: Insufficient documentation

## 2016-11-27 MED ORDER — ACETAMINOPHEN 500 MG PO TABS
1000.0000 mg | ORAL_TABLET | Freq: Once | ORAL | Status: AC
  Administered 2016-11-27: 1000 mg via ORAL
  Filled 2016-11-27: qty 2

## 2016-11-27 MED ORDER — KETOROLAC TROMETHAMINE 60 MG/2ML IM SOLN
60.0000 mg | Freq: Once | INTRAMUSCULAR | Status: AC
  Administered 2016-11-27: 60 mg via INTRAMUSCULAR
  Filled 2016-11-27: qty 2

## 2016-11-27 NOTE — ED Notes (Signed)
Patient to the desk asking for something for pain, patient made aware that we are waiting for MD

## 2016-11-27 NOTE — ED Provider Notes (Signed)
[facility] Provider Note   CSN: [id number] Arrival date & time: 11/27/16  0039     History   Chief Complaint Chief Complaint  Patient presents with  . Finger Injury    HPI Linda Foley is a 50 y.o. female. No sig PMH, here with finger pain. Patient states she was moving furniture and jammed her L thumb into a couch.  This occurred earlier yesterday morning.  She did not do anything for the pain all day long and did not take any medications for it.  She states it still hurts and she is concerned for bony injury.  There are no further complaints.  10 Systems reviewed and are negative for acute change except as noted in the HPI.   HPI  Past Medical History:  Diagnosis Date  . Anxiety   . COPD (chronic obstructive pulmonary disease) (HCC)     There are no active problems to display for this patient.   Past Surgical History:  Procedure Laterality Date  . CESAREAN SECTION    . CHOLECYSTECTOMY    . TUBAL LIGATION      OB History    No data available       Home Medications    Prior to Admission medications   Medication Sig Start Date End Date Taking? Authorizing Provider  acetaminophen (TYLENOL) 500 MG tablet Take 1,000 mg by mouth every 6 (six) hours as needed (pain).     Historical Provider, MD  albuterol (PROVENTIL HFA;VENTOLIN HFA) 108 (90 Base) MCG/ACT inhaler Inhale 1-2 puffs into the lungs every 6 (six) hours as needed for wheezing or shortness of breath. 10/10/16   Nelva Nayobert Beaton, MD  azithromycin (ZITHROMAX) 250 MG tablet Take 1 tablet (250 mg total) by mouth daily. Patient not taking: Reported on 10/15/2016 01/01/16   Tomasita CrumbleAdeleke Payam Gribble, MD  cephALEXin (KEFLEX) 500 MG capsule Take 1 capsule (500 mg total) by mouth 4 (four) times daily. 10/15/16   Tatyana Kirichenko, PA-C  doxycycline (VIBRAMYCIN) 100 MG capsule Take 1 capsule (100 mg total) by mouth 2 (two) times daily. Patient not taking: Reported on 10/15/2016 10/10/16   Nelva Nayobert Beaton, MD    HYDROcodone-homatropine [facility](HYCODAN) 5-1.5 MG/5ML syrup Take 5 mLs by mouth every 6 (six) hours as needed for cough. Patient not taking: Reported on 10/15/2016 10/10/16   Nelva Nayobert Beaton, MD  promethazine (PHENERGAN) 12.5 MG tablet Take 1 tablet (12.5 mg total) by mouth every 6 (six) hours as needed for nausea or vomiting. 10/15/16   Jaynie Crumbleatyana Kirichenko, PA-C    Family History History reviewed. No pertinent family history.  Social History Social History  Substance Use Topics  . Smoking status: Heavy Tobacco Smoker    Packs/day: 2.00    Years: 30.00    Types: Cigarettes  . Smokeless tobacco: Never Used  . Alcohol use No     Comment: former     Allergies   Penicillins   Review of Systems Review of Systems   Physical Exam Updated Vital Signs BP 99/77 (BP Location: Right Arm)   Pulse 89   Temp 98.3 F (36.8 C) (Oral)   Resp 18   Ht 5\' 3"  (1.6 m)   Wt 145 lb (65.8 kg)   LMP 12/12/2011   SpO2 90%   BMI 25.69 kg/m   Physical Exam  Constitutional: She is oriented to person, place, and time. She appears well-developed and well-nourished. No distress.  HENT:  Head: Normocephalic and atraumatic.  Nose: Nose normal.  Mouth/Throat: Oropharynx is clear and  moist. No oropharyngeal exudate.  Eyes: Conjunctivae and EOM are normal. Pupils are equal, round, and reactive to light. No scleral icterus.  Neck: Normal range of motion. Neck supple. No JVD present. No tracheal deviation present. No thyromegaly present.  Cardiovascular: Normal rate, regular rhythm and normal heart sounds.  Exam reveals no gallop and no friction rub.   No murmur heard. Pulmonary/Chest: Effort normal and breath sounds normal. No respiratory distress. She has no wheezes. She exhibits no tenderness.  Abdominal: Soft. Bowel sounds are normal. She exhibits no distension and no mass. There is no tenderness. There is no rebound and no guarding.  Musculoskeletal: Normal range of motion. She exhibits no edema,  tenderness or deformity.  Normal L thumb, no deformity, no TTP, normal sensation  Lymphadenopathy:    She has no cervical adenopathy.  Neurological: She is alert and oriented to person, place, and time. No cranial nerve deficit. She exhibits normal muscle tone.  Skin: Skin is warm and dry. No rash noted. No erythema. No pallor.  Nursing note and vitals reviewed.    ED Treatments / Results  Labs (all labs ordered are listed, but only abnormal results are displayed) Labs Reviewed - No data to display  EKG  EKG Interpretation None       Radiology Dg Hand Complete Left  Result Date: 11/27/2016 CLINICAL DATA:  Crush injury yesterday morning.  Persistent pain. EXAM: LEFT HAND - COMPLETE 3+ VIEW COMPARISON:  None. FINDINGS: There is no evidence of fracture or dislocation. There is no evidence of arthropathy or other focal bone abnormality. Soft tissues are unremarkable. IMPRESSION: Negative. Electronically Signed   By: Ellery Plunk M.D.   On: 11/27/2016 01:36    Procedures Procedures (including critical care time)  Medications Ordered in ED Medications  ketorolac (TORADOL) injection 60 mg (not administered)  acetaminophen (TYLENOL) tablet 1,000 mg (not administered)     Initial Impression / Assessment and Plan / ED Course  I have reviewed the triage vital signs and the nursing notes.  Pertinent labs & imaging results that were available during my care of the patient were reviewed by me and considered in my medical decision making (see chart for details).  Clinical Course     Patient presents to the ED for finger pain. XR is neg for injury.  She likely jammed her finger.  Given toradol, tylenol and an ice pack.  Encouraged ibuprofen tylenol and ice at home.  PCP fu advised within 3 days.  She appears well and in NAD.  Vs remain within her normal limits and she is safe for DC.  Final Clinical Impressions(s) / ED Diagnoses   Final diagnoses:  Contusion of left thumb  without damage to nail, initial encounter    New Prescriptions New Prescriptions   No medications on file     Tomasita Crumble, MD 11/27/16 0200

## 2016-11-27 NOTE — ED Notes (Signed)
Patient asked if she could get an RX for her inhaler. This Rn made the patient aware that we do not fill daily medications through the ED that we have given her a referral for a PCP. Patient reports " I do not have a PCP and every time I need an inhaler I come her and they give me one. Now I just will have to wait until I get so bad that I have to come in again". Patient educated about the usage of the ER and given resources to [facility] and wellness and how they can help. Patient continues to be upset as she left

## 2016-11-27 NOTE — ED Notes (Signed)
Patient states "are you not going to given me something other than this shot". Patient made aware that the MD would have to make the choice to give her an RX if needed.

## 2016-11-27 NOTE — ED Triage Notes (Signed)
Patient states that she jammed her left thumb and hand this am while moving furniture

## 2016-11-27 NOTE — ED Triage Notes (Signed)
Patients triage was done by Ripley FraiseKelean Connor, RN  - not by Dene GentryKayla Mickey EMT

## 2016-12-28 ENCOUNTER — Emergency Department (HOSPITAL_BASED_OUTPATIENT_CLINIC_OR_DEPARTMENT_OTHER): Payer: Self-pay

## 2016-12-28 ENCOUNTER — Emergency Department (HOSPITAL_BASED_OUTPATIENT_CLINIC_OR_DEPARTMENT_OTHER)
Admission: EM | Admit: 2016-12-28 | Discharge: 2016-12-28 | Disposition: A | Discharge status: Home / Self Care | Payer: Self-pay | Attending: Emergency Medicine | Admitting: Emergency Medicine

## 2016-12-28 ENCOUNTER — Encounter (HOSPITAL_BASED_OUTPATIENT_CLINIC_OR_DEPARTMENT_OTHER): Payer: Self-pay | Admitting: *Deleted

## 2016-12-28 DIAGNOSIS — M2550 Pain in unspecified joint: Secondary | ICD-10-CM | POA: Insufficient documentation

## 2016-12-28 DIAGNOSIS — Y9289 Other specified places as the place of occurrence of the external cause: Secondary | ICD-10-CM | POA: Insufficient documentation

## 2016-12-28 DIAGNOSIS — Y939 Activity, unspecified: Secondary | ICD-10-CM | POA: Insufficient documentation

## 2016-12-28 DIAGNOSIS — F17210 Nicotine dependence, cigarettes, uncomplicated: Secondary | ICD-10-CM | POA: Insufficient documentation

## 2016-12-28 DIAGNOSIS — J449 Chronic obstructive pulmonary disease, unspecified: Secondary | ICD-10-CM | POA: Insufficient documentation

## 2016-12-28 DIAGNOSIS — Y999 Unspecified external cause status: Secondary | ICD-10-CM | POA: Insufficient documentation

## 2016-12-28 DIAGNOSIS — Z79899 Other long term (current) drug therapy: Secondary | ICD-10-CM | POA: Insufficient documentation

## 2016-12-28 DIAGNOSIS — T148XXA Other injury of unspecified body region, initial encounter: Secondary | ICD-10-CM | POA: Insufficient documentation

## 2016-12-28 DIAGNOSIS — M549 Dorsalgia, unspecified: Secondary | ICD-10-CM | POA: Insufficient documentation

## 2016-12-28 DIAGNOSIS — R109 Unspecified abdominal pain: Secondary | ICD-10-CM | POA: Insufficient documentation

## 2016-12-28 MED ORDER — ACETAMINOPHEN 325 MG PO TABS
650.0000 mg | ORAL_TABLET | Freq: Once | ORAL | Status: AC
  Administered 2016-12-28: 650 mg via ORAL
  Filled 2016-12-28: qty 2

## 2016-12-28 MED ORDER — ALBUTEROL SULFATE HFA 108 (90 BASE) MCG/ACT IN AERS
2.0000 | INHALATION_SPRAY | RESPIRATORY_TRACT | Status: DC | PRN
  Administered 2016-12-28: 2 via RESPIRATORY_TRACT
  Filled 2016-12-28: qty 6.7

## 2016-12-28 MED ORDER — AEROCHAMBER PLUS W/MASK MISC
1.0000 | Freq: Once | Status: AC
  Administered 2016-12-28: 1
  Filled 2016-12-28: qty 1

## 2016-12-28 NOTE — ED Triage Notes (Signed)
Pt reports she's currently out of her inhaler and needs a refill.

## 2016-12-28 NOTE — Discharge Instructions (Signed)
Take Tylenol as directed for pain. Use your albuterol inhaler 2 puffs every 4 hours as needed for wheezing or shortness of breath. Return if needed more than every 4 hours. Contact Dr.Hudnall if you are not improving or having significant pain in 4 or 5 days. Contact your local health department or call the 800 number on these discharge instructions to get a primary care physician. Ask your primary care physician to help you to stop smoking. Return if concern for any reason.

## 2016-12-28 NOTE — ED Notes (Signed)
ED Provider at bedside. 
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
## 2017-03-12 ENCOUNTER — Encounter (HOSPITAL_BASED_OUTPATIENT_CLINIC_OR_DEPARTMENT_OTHER): Payer: Self-pay

## 2017-03-12 ENCOUNTER — Emergency Department (HOSPITAL_BASED_OUTPATIENT_CLINIC_OR_DEPARTMENT_OTHER)
Admission: EM | Admit: 2017-03-12 | Discharge: 2017-03-13 | Discharge status: Against Medical Advice | Payer: Self-pay | Attending: Emergency Medicine | Admitting: Emergency Medicine

## 2017-03-12 DIAGNOSIS — J449 Chronic obstructive pulmonary disease, unspecified: Secondary | ICD-10-CM | POA: Insufficient documentation

## 2017-03-12 DIAGNOSIS — F17210 Nicotine dependence, cigarettes, uncomplicated: Secondary | ICD-10-CM | POA: Insufficient documentation

## 2017-03-12 DIAGNOSIS — F1290 Cannabis use, unspecified, uncomplicated: Secondary | ICD-10-CM | POA: Insufficient documentation

## 2017-03-12 DIAGNOSIS — T782XXA Anaphylactic shock, unspecified, initial encounter: Secondary | ICD-10-CM | POA: Insufficient documentation

## 2017-03-12 NOTE — ED Triage Notes (Addendum)
c/o scattered hives since last night-last benadryl 3 hours PTA-no resp distress-pt is anxious-will not sit still in triage chair

## 2017-03-13 MED ORDER — EPINEPHRINE 0.3 MG/0.3ML IJ SOAJ: 0.3000 mg | Freq: Once | INTRAMUSCULAR | 0 refills | Status: AC

## 2017-03-13 MED ORDER — EPINEPHRINE 0.3 MG/0.3ML IJ SOAJ
0.3000 mg | Freq: Once | INTRAMUSCULAR | Status: DC
  Filled 2017-03-13: qty 0.3

## 2017-03-13 MED ORDER — PREDNISONE 20 MG PO TABS: 60.0000 mg | ORAL_TABLET | Freq: Every day | ORAL | 0 refills | Status: DC

## 2017-03-13 MED ORDER — DIPHENHYDRAMINE HCL 25 MG PO CAPS
50.0000 mg | ORAL_CAPSULE | Freq: Once | ORAL | Status: AC
  Administered 2017-03-13: 50 mg via ORAL
  Filled 2017-03-13: qty 2

## 2017-03-13 MED ORDER — ALBUTEROL SULFATE HFA 108 (90 BASE) MCG/ACT IN AERS
2.0000 | INHALATION_SPRAY | Freq: Once | RESPIRATORY_TRACT | Status: AC
  Administered 2017-03-13: 2 via RESPIRATORY_TRACT
  Filled 2017-03-13: qty 6.7

## 2017-03-13 MED ORDER — PREDNISONE 50 MG PO TABS
60.0000 mg | ORAL_TABLET | Freq: Once | ORAL | Status: AC
  Administered 2017-03-13: 01:00:00 60 mg via ORAL
  Filled 2017-03-13: qty 1

## 2017-03-13 NOTE — ED Notes (Signed)
Pt given rx for prednisone and epi pen. Pt given instructions to return to ED if she needs to. Pt verbalized understanding.

## 2017-03-13 NOTE — ED Provider Notes (Signed)
[facility]   CSN: [id number] Arrival date & time: 03/12/17  2238     History   Chief Complaint Chief Complaint  Patient presents with  . Urticaria    HPI Linda Foley is a 51 y.o. female with a past medical history of COPD and anxiety presenting today for rash. Patient states this developed approximately 3 hours prior to arrival. She states she did use a new type of soap but is unsure if this causes it. She has diffuse rash throughout her body which is very pruritic. She denies any drainage or fevers. She's had no vomiting or shortness of breath. No swelling in her throat. She denies this ever happening to her in the past. She took a 25 mg Benadryl earlier today without any significant relief. There are no further complaints.  10 Systems reviewed and are negative for acute change except as noted in the HPI.   HPI  Past Medical History:  Diagnosis Date  . Anxiety   . COPD (chronic obstructive pulmonary disease) (HCC)     There are no active problems to display for this patient.   Past Surgical History:  Procedure Laterality Date  . CESAREAN SECTION    . CHOLECYSTECTOMY    . TUBAL LIGATION      OB History    No data available       Home Medications    Prior to Admission medications   Not on File    Family History No family history on file.  Social History Social History  Substance Use Topics  . Smoking status: Heavy Tobacco Smoker    Packs/day: 2.00    Years: 30.00    Types: Cigarettes  . Smokeless tobacco: Never Used  . Alcohol use No     Allergies   Penicillins   Review of Systems Review of Systems   Physical Exam Updated Vital Signs BP 133/79 (BP Location: Left Arm)   Pulse (!) 110   Temp 98.1 F (36.7 C) (Oral)   Resp 20   Ht 5\' 3"  (1.6 m)   Wt 140 lb (63.5 kg)   LMP 12/12/2011   SpO2 96%   BMI 24.80 kg/m   Physical Exam  Constitutional: She is oriented to person, place, and time. She appears  well-developed and well-nourished. No distress.  HENT:  Head: Normocephalic and atraumatic.  Nose: Nose normal.  Mouth/Throat: Oropharynx is clear and moist. No oropharyngeal exudate.  Eyes: Conjunctivae and EOM are normal. Pupils are equal, round, and reactive to light. No scleral icterus.  Neck: Normal range of motion. Neck supple. No JVD present. No tracheal deviation present. No thyromegaly present.  Cardiovascular: Normal rate, regular rhythm and normal heart sounds.  Exam reveals no gallop and no friction rub.   No murmur heard. Pulmonary/Chest: Effort normal. No respiratory distress. She has wheezes. She exhibits no tenderness.  Mild intermittent exotropia wheezing heard.  Abdominal: Soft. Bowel sounds are normal. She exhibits no distension and no mass. There is no tenderness. There is no rebound and no guarding.  Musculoskeletal: Normal range of motion. She exhibits no edema or tenderness.  Lymphadenopathy:    She has no cervical adenopathy.  Neurological: She is alert and oriented to person, place, and time. No cranial nerve deficit. She exhibits normal muscle tone.  Skin: Skin is warm and dry. Rash noted. No erythema. No pallor.  Diffuse rash seen throughout her body in all extremities, chest and back. It Is erythematous flat and urticarial. No  secretions, no drainage.  Nursing note and vitals reviewed.    ED Treatments / Results  Labs (all labs ordered are listed, but only abnormal results are displayed) Labs Reviewed - No data to display  EKG  EKG Interpretation None       Radiology No results found.  Procedures Procedures (including critical care time)  Medications Ordered in ED Medications  EPINEPHrine (EPI-PEN) injection 0.3 mg (not administered)  predniSONE (DELTASONE) tablet 60 mg (not administered)  albuterol (PROVENTIL HFA;VENTOLIN HFA) 108 (90 Base) MCG/ACT inhaler 2 puff (not administered)  diphenhydrAMINE (BENADRYL) capsule 50 mg (50 mg Oral Given  03/13/17 0007)     Initial Impression / Assessment and Plan / ED Course  I have reviewed the triage vital signs and the nursing notes.  Pertinent labs & imaging results that were available during my care of the patient were reviewed by me and considered in my medical decision making (see chart for details).           FiPatient presents emergency department for urticarial rash, possible allergic reaction. Benadryl did not help at home, gave full dose in emergency department as well as EpiPen. She was given prednisone as well. She does have mild wheezing on exam and his run out of her albuterol inhaler for COPD so this was replaced. Will observe in the emergency department for improvement.  12:47 AM PAtient states she does not want to stay 3 hours for epi pen.  She has decision making capacity and can not be convinced otherwise.  Will DC with epipen rx and prednisone.  PCP fu advised.  Will DC AMA.  nal Clinical Impressions(s) / ED Diagnoses   Final diagnoses:  None    New Prescriptions New Prescriptions   No medications on file     Tomasita Crumble, MD 03/13/17 [phone]
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
## 2017-03-14 ENCOUNTER — Encounter (HOSPITAL_COMMUNITY): Payer: Self-pay | Admitting: Emergency Medicine

## 2017-03-14 ENCOUNTER — Emergency Department (HOSPITAL_COMMUNITY)
Admission: EM | Admit: 2017-03-14 | Discharge: 2017-03-14 | Disposition: A | Discharge status: Home / Self Care | Payer: Self-pay | Attending: Emergency Medicine | Admitting: Emergency Medicine

## 2017-03-14 DIAGNOSIS — J449 Chronic obstructive pulmonary disease, unspecified: Secondary | ICD-10-CM | POA: Insufficient documentation

## 2017-03-14 DIAGNOSIS — F17210 Nicotine dependence, cigarettes, uncomplicated: Secondary | ICD-10-CM | POA: Insufficient documentation

## 2017-03-14 DIAGNOSIS — L509 Urticaria, unspecified: Secondary | ICD-10-CM | POA: Insufficient documentation

## 2017-03-14 DIAGNOSIS — Z79899 Other long term (current) drug therapy: Secondary | ICD-10-CM | POA: Insufficient documentation

## 2017-03-14 MED ORDER — PREDNISONE 20 MG PO TABS
60.0000 mg | ORAL_TABLET | Freq: Once | ORAL | Status: AC
  Administered 2017-03-14: 60 mg via ORAL
  Filled 2017-03-14: qty 3

## 2017-03-14 MED ORDER — FAMOTIDINE 20 MG PO TABS
40.0000 mg | ORAL_TABLET | Freq: Once | ORAL | Status: AC
  Administered 2017-03-14: 40 mg via ORAL
  Filled 2017-03-14: qty 2

## 2017-03-14 MED ORDER — DIPHENHYDRAMINE HCL 25 MG PO CAPS
50.0000 mg | ORAL_CAPSULE | Freq: Once | ORAL | Status: AC
  Administered 2017-03-14: 50 mg via ORAL
  Filled 2017-03-14: qty 2

## 2017-03-14 MED ORDER — ONDANSETRON 4 MG PO TBDP: 8.0000 mg | ORAL_TABLET | Freq: Once | ORAL | Status: DC

## 2017-03-14 MED ORDER — PREDNISONE 50 MG PO TABS: 50.0000 mg | ORAL_TABLET | Freq: Every day | ORAL | 0 refills | Status: DC

## 2017-03-14 MED ORDER — ONDANSETRON 4 MG PO TBDP
8.0000 mg | ORAL_TABLET | Freq: Once | ORAL | Status: AC
  Administered 2017-03-14: 8 mg via ORAL
  Filled 2017-03-14: qty 2

## 2017-03-14 NOTE — Discharge Instructions (Signed)
Continue Benadryl 50mg  which is over the counter Take prednisone for the next 4 days. You have already taken your dose for today Return if symptoms are worsening

## 2017-03-14 NOTE — ED Triage Notes (Signed)
Pt from home with c/o rash on her torso, neck, and back. Pt denies allergies, eating new foods or taking any new medications.

## 2017-03-14 NOTE — ED Provider Notes (Signed)
[facility]-EMERGENCY DEPT Provider Note   CSN: [id number] Arrival date & time: 03/14/17  0533     History   Chief Complaint Chief Complaint  Patient presents with  . Rash    HPI Linda Foley is a 51 y.o. female who presents with a rash. PMH significant for COPD. She states that she left [facility] nights ago because she couldn't wait 3 hours after an Epi pen injection. She states that over the past day her rash has been worsening. It is intensely pruritic. She reports associated sore throat. She has not filled her rx for Epi-pen and Prednisone because it was $400. She has been only taking Benadryl which has not helped.  HPI  Past Medical History:  Diagnosis Date  . Anxiety   . COPD (chronic obstructive pulmonary disease) (HCC)     There are no active problems to display for this patient.   Past Surgical History:  Procedure Laterality Date  . CESAREAN SECTION    . CHOLECYSTECTOMY    . TUBAL LIGATION      OB History    No data available       Home Medications    Prior to Admission medications   Medication Sig Start Date End Date Taking? Authorizing Provider  diphenhydrAMINE (BENADRYL) 25 mg capsule Take 25 mg by mouth every 6 (six) hours as needed for itching.   Yes Historical Provider, MD  predniSONE (DELTASONE) 50 MG tablet Take 1 tablet (50 mg total) by mouth daily. 03/14/17   Bethel BornKelly Marie Jlee Harkless, PA-C    Family History No family history on file.  Social History Social History  Substance Use Topics  . Smoking status: Heavy Tobacco Smoker    Packs/day: 2.00    Years: 30.00    Types: Cigarettes  . Smokeless tobacco: Never Used  . Alcohol use No     Allergies   Penicillins   Review of Systems Review of Systems  Constitutional: Negative for chills and fever.  HENT: Positive for sore throat. Negative for trouble swallowing.   Respiratory: Positive for wheezing. Negative for shortness of breath.   Gastrointestinal: Negative for nausea and vomiting.   Skin: Positive for rash.  All other systems reviewed and are negative.    Physical Exam Updated Vital Signs LMP 12/12/2011   Physical Exam  Constitutional: She is oriented to person, place, and time. She appears well-developed and well-nourished. She appears distressed (due to itchiness).  HENT:  Head: Normocephalic and atraumatic.  Mouth/Throat: No posterior oropharyngeal edema.  Patent airway  Eyes: Conjunctivae are normal. Pupils are equal, round, and reactive to light. Right eye exhibits no discharge. Left eye exhibits no discharge. No scleral icterus.  Neck: Normal range of motion.  Cardiovascular: Normal rate and regular rhythm.  Exam reveals no gallop and no friction rub.   No murmur heard. Pulmonary/Chest: Effort normal and breath sounds normal. No respiratory distress. She has no wheezes. She has no rales. She exhibits no tenderness.  Abdominal: She exhibits no distension.  Neurological: She is alert and oriented to person, place, and time.  Skin: Skin is warm and dry. Rash (generalized urticarial rash) noted.  Psychiatric: She has a normal mood and affect. Her behavior is normal.  Nursing note and vitals reviewed.    ED Treatments / Results  Labs (all labs ordered are listed, but only abnormal results are displayed) Labs Reviewed - No data to display  EKG  EKG Interpretation None       Radiology No  results found.  Procedures Procedures (including critical care time)  Medications Ordered in ED Medications  predniSONE (DELTASONE) tablet 60 mg (60 mg Oral Given 03/14/17 0625)  diphenhydrAMINE (BENADRYL) capsule 50 mg (50 mg Oral Given 03/14/17 0625)  ondansetron (ZOFRAN-ODT) disintegrating tablet 8 mg (8 mg Oral Given 03/14/17 0715)  famotidine (PEPCID) tablet 40 mg (40 mg Oral Given 03/14/17 0806)     Initial Impression / Assessment and Plan / ED Course  I have reviewed the triage vital signs and the nursing notes.  Pertinent labs & imaging results that  were available during my care of the patient were reviewed by me and considered in my medical decision making (see chart for details).  51 year old female presents with ongoing rash. Her vitals are normal. No signs of anaphylaxis. No wheezing on exam. No vomiting or throat swelling. She was able to tolerate PO. Benadryl, Pepcid, Prednisone given in ED.  On recheck, pt is asking to stay. I advised we could not keep her indefinitely and the rash would improve with time. Reprinted Prednisone rx for her. Advised continue Benadryl OTC. Return for worsening symptoms.  Final Clinical Impressions(s) / ED Diagnoses   Final diagnoses:  Urticaria    New Prescriptions New Prescriptions   No medications on file     Bethel Born, PA-C 03/14/17 0840    Tomasita Crumble, MD 03/14/17 1445

## 2017-03-15 ENCOUNTER — Emergency Department (HOSPITAL_COMMUNITY)
Admission: EM | Admit: 2017-03-15 | Discharge: 2017-03-15 | Disposition: A | Discharge status: Home / Self Care | Payer: Self-pay | Attending: Emergency Medicine | Admitting: Emergency Medicine

## 2017-03-15 ENCOUNTER — Encounter (HOSPITAL_COMMUNITY): Payer: Self-pay | Admitting: Emergency Medicine

## 2017-03-15 DIAGNOSIS — J449 Chronic obstructive pulmonary disease, unspecified: Secondary | ICD-10-CM | POA: Insufficient documentation

## 2017-03-15 DIAGNOSIS — L500 Allergic urticaria: Secondary | ICD-10-CM | POA: Insufficient documentation

## 2017-03-15 DIAGNOSIS — F17210 Nicotine dependence, cigarettes, uncomplicated: Secondary | ICD-10-CM | POA: Insufficient documentation

## 2017-03-15 DIAGNOSIS — R21 Rash and other nonspecific skin eruption: Secondary | ICD-10-CM

## 2017-03-15 MED ORDER — PREDNISONE 20 MG PO TABS
60.0000 mg | ORAL_TABLET | Freq: Once | ORAL | Status: AC
  Administered 2017-03-15: 60 mg via ORAL
  Filled 2017-03-15: qty 3

## 2017-03-15 MED ORDER — RANITIDINE HCL 15 MG/ML PO SYRP: 40.0000 mg | ORAL_SOLUTION | Freq: Once | ORAL | Status: DC

## 2017-03-15 MED ORDER — FAMOTIDINE 20 MG PO TABS
20.0000 mg | ORAL_TABLET | Freq: Once | ORAL | Status: AC
  Administered 2017-03-15: 20 mg via ORAL
  Filled 2017-03-15: qty 1

## 2017-03-15 MED ORDER — PREDNISONE 10 MG PO TABS: 60.0000 mg | ORAL_TABLET | Freq: Every day | ORAL | 0 refills | Status: AC

## 2017-03-15 MED ORDER — DIPHENHYDRAMINE HCL 25 MG PO CAPS
50.0000 mg | ORAL_CAPSULE | Freq: Once | ORAL | Status: AC
  Administered 2017-03-15: 50 mg via ORAL
  Filled 2017-03-15: qty 2

## 2017-03-15 NOTE — ED Provider Notes (Signed)
[facility]-EMERGENCY DEPT Provider Note   CSN: [id number] Arrival date & time: 03/15/17  1212   By signing my name below, I, Linda Foley, attest that this documentation has been prepared under the direction and in the presence of Linda HarpFrank Trevelle Mcgurn, PA-C Electronically Signed: Soijett Foley, ED Scribe. 03/15/17. 1:06 PM.  History   Chief Complaint Chief Complaint  Patient presents with  . Rash    HPI Linda Foley is a 51 y.o. female who presents to the Emergency Department complaining of worsening pruritic generalized rash onset 3-4 days ago. Pt reports associated chills and resolved sore throat. Pt has tried Rx prednisone and benadryl cream with mild relief of her symptoms. She also states that the medications given to her during her previous visit in ED did relieve symptoms "for some time". She notes that she was evaluated for her symptoms and advised that her detergent was the cause of her symptoms. Pt states that she rewashed all of her clothes in a different detergent with no relief of her symptoms. Pt denies new soaps, medications, pets, environment, lotion, or detergent.  Denies past similar symptoms, PMHx psoriasis or eczema. She denies SOB, trouble swallowing, lip/tongue/facial swelling, and any other symptoms. Pt is not allergic to any medications. Denies taking daily medications or having a PCP.   Per pt chart review: Pt was seen in the ED on 03/14/2017 for uriticaria. Pt was given pepcid, prednisone, benadryl, and zofran while in the ED. Pt was Rx prednisone for their symptoms. Pt was also seen on 03/12/2017 for an urticarial rash. Pt left AMA due to not wanting to wait the observation time of 3 hours following epi-pen injection.   The history is provided by the patient. No language interpreter was used.    Past Medical History:  Diagnosis Date  . Anxiety   . COPD (chronic obstructive pulmonary disease) (HCC)     There are no active problems to display for this patient.   Past  Surgical History:  Procedure Laterality Date  . CESAREAN SECTION    . CHOLECYSTECTOMY    . TUBAL LIGATION      OB History    No data available       Home Medications    Prior to Admission medications   Medication Sig Start Date End Date Taking? Authorizing Provider  diphenhydrAMINE (BENADRYL) 25 mg capsule Take 25 mg by mouth every 6 (six) hours as needed for itching.    Historical Provider, MD  predniSONE (DELTASONE) 10 MG tablet Take 6 tablets (60 mg total) by mouth daily. 03/15/17 03/18/17  Tigran Haynie Orson AloeManuel Meleni Delahunt, [place]    Family History History reviewed. No pertinent family history.  Social History Social History  Substance Use Topics  . Smoking status: Heavy Tobacco Smoker    Packs/day: 2.00    Years: 30.00    Types: Cigarettes  . Smokeless tobacco: Never Used  . Alcohol use No     Allergies   Penicillins   Review of Systems Review of Systems  Constitutional: Positive for chills. Negative for fever.  HENT: Positive for sore throat (resolved). Negative for facial swelling and trouble swallowing.        No lip or tongue swelling  Respiratory: Negative for shortness of breath.   Skin: Positive for rash.     Physical Exam Updated Vital Signs BP (!) 112/95   Pulse 80   Temp 97.6 F (36.4 C) (Oral)   Resp 18   LMP 12/12/2011   SpO2 96%   Physical  Exam  Constitutional: She appears well-developed and well-nourished.  Uncomfortable. Patient able to handle secretions without difficulty.  HENT:  Head: Normocephalic and atraumatic.  Nose: Nose normal.  No tongue, mouth, lip swelling noted.  Eyes: Conjunctivae and EOM are normal.  Neck: Normal range of motion.  Cardiovascular: Normal rate, regular rhythm and normal heart sounds.  Exam reveals no gallop and no friction rub.   No murmur heard. Pulmonary/Chest: Effort normal and breath sounds normal. No respiratory distress. She has no wheezes. She has no rales.  Normal work of breathing. No respiratory  distress noted.   Abdominal: Soft.  Musculoskeletal: Normal range of motion.  Neurological: She is alert.  Skin: Skin is warm. Rash noted.  Generalized pruritic rash  Psychiatric: She has a normal mood and affect. Her behavior is normal.  Nursing note and vitals reviewed.    ED Treatments / Results  DIAGNOSTIC STUDIES: Oxygen Saturation is 94% on RA, adequate by my interpretation.    COORDINATION OF CARE: 1:01 PM Discussed treatment plan with pt at bedside which includes referral and follow up with dermatologist and pt agreed to plan.   Procedures Procedures (including critical care time)  Medications Ordered in ED Medications  predniSONE (DELTASONE) tablet 60 mg (60 mg Oral Given 03/15/17 1322)  diphenhydrAMINE (BENADRYL) capsule 50 mg (50 mg Oral Given 03/15/17 1322)  famotidine (PEPCID) tablet 20 mg (20 mg Oral Given 03/15/17 1341)     Initial Impression / Assessment and Plan / ED Course  I have reviewed the triage vital signs and the nursing notes.    Patient with urticarial eruption. No specific trigger. Discussed that urticaria can be trigger by stress heat cold and for unknown reasons. No signs of anaphylactic reaction; no new medications. Will treat with pepcid, prednisone, and benadryl while in the ED. Pt felt better and symptoms improved here in ED. Pt to be discharged home with Rx 60 mg prednisone x 3 days. Follow up with Allergist in 2-3 days. Return precautions discussed. Pt is safe for discharge at this time.   Final Clinical Impressions(s) / ED Diagnoses   Final diagnoses:  Rash    New Prescriptions Discharge Medication List as of 03/15/2017  2:10 PM     I personally performed the services described in this documentation, which was scribed in my presence. The recorded information has been reviewed and is accurate.    [address] [place], [place] 03/15/17 1423    Derwood Kaplan, MD 03/16/17 [phone]

## 2017-03-15 NOTE — Discharge Instructions (Signed)
Please take prednisone 60 mg 3 days. Please take as directed, do not skip a day. Please also take Benadryl as needed for itching. He can also use cold compress to the area to alleviate itching. Please follow-up with a dermatologist regarding your symptoms. You can also follow with an allergist regarding today's visit.  Get help right away if: You have a fever. You have pain in your abdomen. Your tongue or lips are swollen. Your eyelids are swollen. Your chest or throat feels tight. You have trouble breathing or swallowing.

## 2017-03-15 NOTE — ED Triage Notes (Signed)
Pt reports ongoing itchy rash for past several days, not getting better with rx

## 2017-03-17 ENCOUNTER — Emergency Department (HOSPITAL_COMMUNITY)
Admission: EM | Admit: 2017-03-17 | Discharge: 2017-03-17 | Disposition: A | Discharge status: Home / Self Care | Payer: Self-pay | Attending: Emergency Medicine | Admitting: Emergency Medicine

## 2017-03-17 ENCOUNTER — Encounter (HOSPITAL_COMMUNITY): Payer: Self-pay | Admitting: Oncology

## 2017-03-17 DIAGNOSIS — L509 Urticaria, unspecified: Secondary | ICD-10-CM | POA: Insufficient documentation

## 2017-03-17 DIAGNOSIS — J449 Chronic obstructive pulmonary disease, unspecified: Secondary | ICD-10-CM | POA: Insufficient documentation

## 2017-03-17 DIAGNOSIS — F17210 Nicotine dependence, cigarettes, uncomplicated: Secondary | ICD-10-CM | POA: Insufficient documentation

## 2017-03-17 DIAGNOSIS — Z79899 Other long term (current) drug therapy: Secondary | ICD-10-CM | POA: Insufficient documentation

## 2017-03-17 MED ORDER — FAMOTIDINE 20 MG PO TABS: 20.0000 mg | ORAL_TABLET | Freq: Two times a day (BID) | ORAL | 0 refills | Status: DC

## 2017-03-17 MED ORDER — DIPHENHYDRAMINE HCL 25 MG PO CAPS
50.0000 mg | ORAL_CAPSULE | Freq: Once | ORAL | Status: AC
  Administered 2017-03-17: 50 mg via ORAL
  Filled 2017-03-17: qty 2

## 2017-03-17 MED ORDER — HYDROXYZINE HCL 25 MG PO TABS: 25.0000 mg | ORAL_TABLET | Freq: Four times a day (QID) | ORAL | 0 refills | Status: DC

## 2017-03-17 NOTE — ED Triage Notes (Signed)
Pt c/o generalized rash covering body.  Pt states she used a new detergent prior to the rash appearing.  Pt has been seen multiple times for the same and given prednisone.  Pt reports that she looked on google and is concerned she may have a rare cancer.  Pt has pressured speech, is unable to sit still and has marks to right hand that look similar to track marks.  Pt is rating pain 9/10.

## 2017-03-17 NOTE — ED Provider Notes (Signed)
[facility]-EMERGENCY DEPT Provider Note   CSN: [id number] Arrival date & time: 03/17/17  4098     History   Chief Complaint Chief Complaint  Patient presents with  . Rash    HPI Linda Foley is a 51 y.o. female.  HPI   51 year old female with history of anxiety presenting to the ED complaining of rash. This is patient's third visit for same. Patient reports 6 days ago she developed an itchy generalized rash after using a new soap. She is unsure if the soap have caused her rash. She did come to the ER 3 hrs after developing the rash.  She was given benadryl and epinephrine, and was discharged with epipen prescription and prednisone.  She was seen again in the ER on 3/23 and 3/24 for the same rash.  No concerning feature were noted and pt was discharge with steroid and dermatology referral.  Pt is here again today reporting that after Googling she is concerned she may have a rare cancer and requesting for a complete work up.  She denies having fever, headache, lightheadedness, dizziness, tongue swelling, trouble swallowing, increased dose of breath, abdominal cramping, vomiting or diarrhea. Denies any other medication changes, denies IV drug use or street drug use. Her husband is with a without similar rash. She has not follow-up with the dermatologist's previously recommended. She does not have a PCP.   Past Medical History:  Diagnosis Date  . Anxiety   . COPD (chronic obstructive pulmonary disease) (HCC)     There are no active problems to display for this patient.   Past Surgical History:  Procedure Laterality Date  . CESAREAN SECTION    . CHOLECYSTECTOMY    . TUBAL LIGATION      OB History    No data available       Home Medications    Prior to Admission medications   Medication Sig Start Date End Date Taking? Authorizing Provider  diphenhydrAMINE (BENADRYL) 25 mg capsule Take 25 mg by mouth every 6 (six) hours as needed for itching.    Historical Provider, MD    predniSONE (DELTASONE) 10 MG tablet Take 6 tablets (60 mg total) by mouth daily. 03/15/17 03/18/17  Alvina Chou, [place]    Family History No family history on file.  Social History Social History  Substance Use Topics  . Smoking status: Heavy Tobacco Smoker    Packs/day: 2.00    Years: 30.00    Types: Cigarettes  . Smokeless tobacco: Never Used  . Alcohol use No     Allergies   Penicillins   Review of Systems Review of Systems  All other systems reviewed and are negative.    Physical Exam Updated Vital Signs BP 134/84 (BP Location: Left Arm)   Pulse 87   Temp 97.7 F (36.5 C) (Oral)   Resp 20   Ht 5\' 3"  (1.6 m)   Wt 63.5 kg   LMP 12/12/2011   SpO2 94%   BMI 24.80 kg/m   Physical Exam  Constitutional: She is oriented to person, place, and time. She appears well-developed and well-nourished. No distress.  HENT:  Head: Atraumatic.  Mouth/Throat: Oropharynx is clear and moist.  No tongue swelling  Eyes: Conjunctivae are normal.  Neck: Normal range of motion. Neck supple.  Cardiovascular: Normal rate and regular rhythm.   Pulmonary/Chest:  Rhonchus breath sounds with expiratory wheezes  Abdominal: Soft. She exhibits no distension. There is no tenderness.  Neurological: She is alert and oriented to person,  place, and time.  Skin: Rash (Urticaria rash noted throughout body with excoriation marks on both arms) noted.  Psychiatric: She has a normal mood and affect.  Nursing note and vitals reviewed.    ED Treatments / Results  Labs (all labs ordered are listed, but only abnormal results are displayed) Labs Reviewed - No data to display  EKG  EKG Interpretation None       Radiology No results found.  Procedures Procedures (including critical care time)  Medications Ordered in ED Medications  diphenhydrAMINE (BENADRYL) capsule 50 mg (not administered)     Initial Impression / Assessment and Plan / ED Course  I have reviewed the triage  vital signs and the nursing notes.  Pertinent labs & imaging results that were available during my care of the patient were reviewed by me and considered in my medical decision making (see chart for details).     BP 134/84 (BP Location: Left Arm)   Pulse 87   Temp 97.7 F (36.5 C) (Oral)   Resp 20   Ht 5\' 3"  (1.6 m)   Wt 63.5 kg   LMP 12/12/2011   SpO2 94%   BMI 24.80 kg/m    Final Clinical Impressions(s) / ED Diagnoses   Final diagnoses:  Urticaria    New Prescriptions New Prescriptions   FAMOTIDINE (PEPCID) 20 MG TABLET    Take 1 tablet (20 mg total) by mouth 2 (two) times daily.   HYDROXYZINE (ATARAX/VISTARIL) 25 MG TABLET    Take 1 tablet (25 mg total) by mouth every 6 (six) hours.   7:31 AM The patient here with urticarial rash likely secondary to  allergy reaction. No symptoms concerning for anaphylaxis. She does have history of COPD and does have chronic wheezing this is not new. She has history of anxiety, appears to be anxious. Patient made aware that she will need to follow up with specialist for further evaluation of her condition. She will also need to follow up with PCP as well. We'll provide Benadryl, prescribed Vistaril to help her with pruritic rash.    Fayrene HelperBowie Gricel Copen, PA-C 03/17/17 [id number]    Lorre NickAnthony Allen, MD 03/18/17 [phone]

## 2017-03-17 NOTE — Discharge Instructions (Signed)
Please call and follow up with dermatologist for further evaluation of your rash.  You will also need to find a primary care provider if you are concerning of potential cancer from the rash, although this is not likely.  Continue with your prednisone, and alternate between vistaril and benadryl for your itch.

## 2017-05-01 ENCOUNTER — Encounter (HOSPITAL_COMMUNITY): Payer: Self-pay | Admitting: Emergency Medicine

## 2017-05-01 ENCOUNTER — Emergency Department (HOSPITAL_COMMUNITY)
Admission: EM | Admit: 2017-05-01 | Discharge: 2017-05-01 | Disposition: A | Discharge status: Home / Self Care | Payer: Self-pay | Attending: Emergency Medicine | Admitting: Emergency Medicine

## 2017-05-01 ENCOUNTER — Emergency Department (HOSPITAL_COMMUNITY): Payer: Self-pay

## 2017-05-01 DIAGNOSIS — Y92410 Unspecified street and highway as the place of occurrence of the external cause: Secondary | ICD-10-CM | POA: Insufficient documentation

## 2017-05-01 DIAGNOSIS — J449 Chronic obstructive pulmonary disease, unspecified: Secondary | ICD-10-CM | POA: Insufficient documentation

## 2017-05-01 DIAGNOSIS — Z79899 Other long term (current) drug therapy: Secondary | ICD-10-CM | POA: Insufficient documentation

## 2017-05-01 DIAGNOSIS — Y939 Activity, unspecified: Secondary | ICD-10-CM | POA: Insufficient documentation

## 2017-05-01 DIAGNOSIS — M542 Cervicalgia: Secondary | ICD-10-CM | POA: Insufficient documentation

## 2017-05-01 DIAGNOSIS — M25512 Pain in left shoulder: Secondary | ICD-10-CM | POA: Insufficient documentation

## 2017-05-01 DIAGNOSIS — Y999 Unspecified external cause status: Secondary | ICD-10-CM | POA: Insufficient documentation

## 2017-05-01 DIAGNOSIS — F17210 Nicotine dependence, cigarettes, uncomplicated: Secondary | ICD-10-CM | POA: Insufficient documentation

## 2017-05-01 DIAGNOSIS — M545 Low back pain: Secondary | ICD-10-CM | POA: Insufficient documentation

## 2017-05-01 MED ORDER — IBUPROFEN 800 MG PO TABS
800.0000 mg | ORAL_TABLET | Freq: Once | ORAL | Status: AC
  Administered 2017-05-01: 800 mg via ORAL
  Filled 2017-05-01: qty 1

## 2017-05-01 MED ORDER — CYCLOBENZAPRINE HCL 10 MG PO TABS: 10.0000 mg | ORAL_TABLET | Freq: Two times a day (BID) | ORAL | 0 refills | Status: DC | PRN

## 2017-05-01 MED ORDER — IBUPROFEN 600 MG PO TABS: 600.0000 mg | ORAL_TABLET | Freq: Four times a day (QID) | ORAL | 0 refills | Status: DC | PRN
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
## 2017-05-01 NOTE — ED Notes (Addendum)
Patient reports she is leaving and does not want to wait for results, wanting to go outside to smoke. PA notified and reports will be in room in a few minutes. Pt agrees to wait.
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
## 2017-05-01 NOTE — ED Triage Notes (Signed)
Pt reports pain back of neck, low back and and abrasion to l/shoulder. Denies LOC. C-collar in place
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
## 2017-05-01 NOTE — ED Notes (Signed)
Patient transported to X-ray 

## 2017-05-13 ENCOUNTER — Emergency Department (HOSPITAL_COMMUNITY): Payer: Self-pay

## 2017-05-13 ENCOUNTER — Encounter (HOSPITAL_COMMUNITY): Payer: Self-pay | Admitting: Emergency Medicine

## 2017-05-13 ENCOUNTER — Emergency Department (HOSPITAL_COMMUNITY)
Admission: EM | Admit: 2017-05-13 | Discharge: 2017-05-13 | Disposition: A | Discharge status: Home / Self Care | Payer: Self-pay | Attending: Emergency Medicine | Admitting: Emergency Medicine

## 2017-05-13 DIAGNOSIS — R0602 Shortness of breath: Secondary | ICD-10-CM | POA: Insufficient documentation

## 2017-05-13 DIAGNOSIS — Z5321 Procedure and treatment not carried out due to patient leaving prior to being seen by health care provider: Secondary | ICD-10-CM | POA: Insufficient documentation

## 2017-05-13 MED ORDER — ALBUTEROL SULFATE (2.5 MG/3ML) 0.083% IN NEBU
5.0000 mg | INHALATION_SOLUTION | Freq: Once | RESPIRATORY_TRACT | Status: AC
  Administered 2017-05-13: 5 mg via RESPIRATORY_TRACT
  Filled 2017-05-13: qty 6

## 2017-05-13 NOTE — ED Notes (Signed)
Called for Pt out in lobby, no response, did not see Pt or visitor with Pt out in the lobby.

## 2017-05-13 NOTE — ED Triage Notes (Addendum)
Pt from home with complaints of SOB x 5 days secondary to her COPD. Pt states she has had congestion x 5 days. Pt states she has central chest pain only when she coughs. Pt states she lost her inhaler and used a neighbors for "a few puffs" but it did not help. Pt has bilateral wheezes but is maintaining her oxygen at 96%

## 2017-05-13 NOTE — ED Notes (Signed)
Called For Pt out in the lobby, no response, do not see Pt.

## 2017-05-15 ENCOUNTER — Encounter (HOSPITAL_COMMUNITY): Payer: Self-pay | Admitting: *Deleted

## 2017-05-15 ENCOUNTER — Emergency Department (HOSPITAL_COMMUNITY)
Admission: EM | Admit: 2017-05-15 | Discharge: 2017-05-15 | Disposition: A | Discharge status: Home / Self Care | Payer: Self-pay | Attending: Emergency Medicine | Admitting: Emergency Medicine

## 2017-05-15 ENCOUNTER — Other Ambulatory Visit: Payer: Self-pay

## 2017-05-15 ENCOUNTER — Emergency Department (HOSPITAL_COMMUNITY): Payer: Self-pay

## 2017-05-15 DIAGNOSIS — J441 Chronic obstructive pulmonary disease with (acute) exacerbation: Secondary | ICD-10-CM | POA: Insufficient documentation

## 2017-05-15 DIAGNOSIS — Z79899 Other long term (current) drug therapy: Secondary | ICD-10-CM | POA: Insufficient documentation

## 2017-05-15 DIAGNOSIS — F17210 Nicotine dependence, cigarettes, uncomplicated: Secondary | ICD-10-CM | POA: Insufficient documentation

## 2017-05-15 LAB — CBC WITH DIFFERENTIAL/PLATELET
Basophils Absolute: 0 10*3/uL (ref 0.0–0.1)
Basophils Relative: 0 %
EOS ABS: 0.3 10*3/uL (ref 0.0–0.7)
Eosinophils Relative: 4 %
HCT: 45.2 % (ref 36.0–46.0)
HEMOGLOBIN: 15.1 g/dL — AB (ref 12.0–15.0)
LYMPHS ABS: 2.2 10*3/uL (ref 0.7–4.0)
Lymphocytes Relative: 29 %
MCH: 30.9 pg (ref 26.0–34.0)
MCHC: 33.4 g/dL (ref 30.0–36.0)
MCV: 92.6 fL (ref 78.0–100.0)
MONOS PCT: 12 %
Monocytes Absolute: 0.9 10*3/uL (ref 0.1–1.0)
NEUTROS PCT: 55 %
Neutro Abs: 4.1 10*3/uL (ref 1.7–7.7)
Platelets: 248 10*3/uL (ref 150–400)
RBC: 4.88 MIL/uL (ref 3.87–5.11)
RDW: 13.4 % (ref 11.5–15.5)
WBC: 7.5 10*3/uL (ref 4.0–10.5)

## 2017-05-15 LAB — COMPREHENSIVE METABOLIC PANEL
ALT: 27 U/L (ref 14–54)
ANION GAP: 8 (ref 5–15)
AST: 27 U/L (ref 15–41)
Albumin: 3.5 g/dL (ref 3.5–5.0)
Alkaline Phosphatase: 65 U/L (ref 38–126)
BUN: 11 mg/dL (ref 6–20)
CALCIUM: 9 mg/dL (ref 8.9–10.3)
CO2: 26 mmol/L (ref 22–32)
Chloride: 107 mmol/L (ref 101–111)
Creatinine, Ser: 0.81 mg/dL (ref 0.44–1.00)
GFR calc non Af Amer: >60 mL/min (ref >60)
Glucose, Bld: 104 mg/dL — ABNORMAL HIGH (ref 65–99)
Potassium: 3.9 mmol/L (ref 3.5–5.1)
SODIUM: 141 mmol/L (ref 135–145)
Total Bilirubin: 0.4 mg/dL (ref 0.3–1.2)
Total Protein: 6.9 g/dL (ref 6.5–8.1)

## 2017-05-15 LAB — I-STAT TROPONIN, ED: TROPONIN I, POC: 0 ng/mL (ref 0.00–0.08)

## 2017-05-15 MED ORDER — ALBUTEROL SULFATE HFA 108 (90 BASE) MCG/ACT IN AERS
1.0000 | INHALATION_SPRAY | RESPIRATORY_TRACT | Status: DC | PRN
  Administered 2017-05-15: 1 via RESPIRATORY_TRACT
  Filled 2017-05-15: qty 6.7

## 2017-05-15 MED ORDER — ALBUTEROL (5 MG/ML) CONTINUOUS INHALATION SOLN
10.0000 mg/h | INHALATION_SOLUTION | RESPIRATORY_TRACT | Status: DC
  Administered 2017-05-15: 10 mg/h via RESPIRATORY_TRACT
  Filled 2017-05-15: qty 20

## 2017-05-15 MED ORDER — ALBUTEROL SULFATE (2.5 MG/3ML) 0.083% IN NEBU
5.0000 mg | INHALATION_SOLUTION | Freq: Once | RESPIRATORY_TRACT | Status: AC
  Administered 2017-05-15: 5 mg via RESPIRATORY_TRACT
  Filled 2017-05-15 (×2): qty 6

## 2017-05-15 MED ORDER — PREDNISONE 20 MG PO TABS
ORAL_TABLET | ORAL | 0 refills | Status: DC
Start: 2017-05-16 — End: 2018-06-20

## 2017-05-15 MED ORDER — METHYLPREDNISOLONE SODIUM SUCC 125 MG IJ SOLR
125.0000 mg | Freq: Once | INTRAMUSCULAR | Status: AC
  Administered 2017-05-15: 125 mg via INTRAVENOUS
  Filled 2017-05-15: qty 2

## 2017-05-15 MED ORDER — LORAZEPAM 2 MG/ML IJ SOLN
0.5000 mg | Freq: Once | INTRAMUSCULAR | Status: AC
  Administered 2017-05-15: 0.5 mg via INTRAVENOUS
  Filled 2017-05-15: qty 1

## 2017-05-15 NOTE — ED Notes (Signed)
Called respiratory for continuous neb.

## 2017-05-15 NOTE — ED Notes (Signed)
Failed attempt at blood draw Rt hand.

## 2017-05-15 NOTE — ED Notes (Signed)
Do not feel comfortable giving Pt more food, Pt already had a sandwich and was falling asleep with huge bites of food in her mouth. Pt is currently asleep sitting up.

## 2017-05-15 NOTE — ED Provider Notes (Signed)
[facility]-EMERGENCY DEPT Provider Note   CSN: [id number] Arrival date & time: 05/15/17  1544     History   Chief Complaint Chief Complaint  Patient presents with  . Cough  . Shortness of Breath    HPI Linda Foley is a 51 y.o. female.  HPI patient presents with 5 days of shortness breath, chest tightness, weakness and productive cough of yellow sputum. States she has chronic abdominal pain which is worse with coughing. Does not have an inhaler at home. Since she's been diagnosed with COPD but does not wear oxygen at home either.  Past Medical History:  Diagnosis Date  . Anxiety   . COPD (chronic obstructive pulmonary disease) (HCC)     There are no active problems to display for this patient.   Past Surgical History:  Procedure Laterality Date  . CESAREAN SECTION    . CHOLECYSTECTOMY    . TUBAL LIGATION      OB History    No data available       Home Medications    Prior to Admission medications   Medication Sig Start Date End Date Taking? Authorizing Provider  cyclobenzaprine (FLEXERIL) 10 MG tablet Take 1 tablet (10 mg total) by mouth 2 (two) times daily as needed for muscle spasms. Patient not taking: Reported on 05/15/2017 05/01/17   Fayrene Helperran, Bowie, PA-C  famotidine (PEPCID) 20 MG tablet Take 1 tablet (20 mg total) by mouth 2 (two) times daily. Patient not taking: Reported on 05/15/2017 03/17/17   Fayrene Helperran, Bowie, PA-C  hydrOXYzine (ATARAX/VISTARIL) 25 MG tablet Take 1 tablet (25 mg total) by mouth every 6 (six) hours. Patient not taking: Reported on 05/15/2017 03/17/17   Fayrene Helperran, Bowie, PA-C  ibuprofen (ADVIL,MOTRIN) 600 MG tablet Take 1 tablet (600 mg total) by mouth every 6 (six) hours as needed. Patient not taking: Reported on 05/15/2017 05/01/17   Fayrene Helperran, Bowie, PA-C  predniSONE (DELTASONE) 20 MG tablet 3 tabs po day one, then 2 po daily x 4 days 05/16/17   Loren RacerYelverton, Milas Schappell, MD    Family History Family History  Problem Relation Age of Onset  . Diabetes Mother      Social History Social History  Substance Use Topics  . Smoking status: Heavy Tobacco Smoker    Packs/day: 2.00    Years: 30.00    Types: Cigarettes  . Smokeless tobacco: Never Used  . Alcohol use No     Allergies   Penicillins   Review of Systems Review of Systems  Constitutional: Negative for chills and fever.  HENT: Positive for congestion. Negative for facial swelling and sore throat.   Respiratory: Positive for cough, chest tightness, shortness of breath and wheezing.   Cardiovascular: Negative for chest pain, palpitations and leg swelling.  Gastrointestinal: Positive for abdominal pain. Negative for diarrhea, nausea and vomiting.  Musculoskeletal: Negative for back pain, myalgias, neck pain and neck stiffness.  Skin: Negative for rash and wound.  Neurological: Negative for dizziness, weakness, numbness and headaches.  Psychiatric/Behavioral: The patient is nervous/anxious.   All other systems reviewed and are negative.    Physical Exam Updated Vital Signs BP 118/89   Pulse 97   Temp 98.1 F (36.7 C) (Oral)   Resp 19   LMP 12/12/2011   SpO2 94%   Physical Exam  Constitutional: She is oriented to person, place, and time. She appears well-developed and well-nourished.  Very anxious appearing  HENT:  Head: Normocephalic and atraumatic.  Mouth/Throat: Oropharynx is clear and moist.  Poor dentition  Eyes: EOM are normal. Pupils are equal, round, and reactive to light.  Neck: Normal range of motion. Neck supple. No JVD present.  Cardiovascular: Normal rate and regular rhythm.  Exam reveals no gallop and no friction rub.   No murmur heard. Pulmonary/Chest: Effort normal. She has wheezes.  Expiratory wheezing throughout  Abdominal: Soft. Bowel sounds are normal. There is no tenderness. There is no rebound and no guarding.  Musculoskeletal: Normal range of motion. She exhibits no edema or tenderness.  No lower extremity swelling or asymmetry.  Neurological:  She is alert and oriented to person, place, and time.  Moving all extremities without deficit. Sensation intact.  Skin: Skin is warm and dry. No rash noted. No erythema.  Psychiatric:  Anxious appearing and hyperactive  Nursing note and vitals reviewed.    ED Treatments / Results  Labs (all labs ordered are listed, but only abnormal results are displayed) Labs Reviewed  CBC WITH DIFFERENTIAL/PLATELET - Abnormal; Notable for the following:       Result Value   Hemoglobin 15.1 (*)    All other components within normal limits  COMPREHENSIVE METABOLIC PANEL - Abnormal; Notable for the following:    Glucose, Bld 104 (*)    All other components within normal limits  I-STAT TROPOININ, ED    EKG  EKG Interpretation None       Radiology Dg Chest 2 View  Result Date: 05/15/2017 CLINICAL DATA:  Cough and shortness of breath for the past 5 days. Chest pain from coughing. Bilateral wheezes. Smoker. EXAM: CHEST  2 VIEW COMPARISON:  10/15/2016. FINDINGS: Normal sized heart. Clear lungs. Minimal diffuse peribronchial thickening and mild hyperexpansion of the lungs. Unremarkable bones. IMPRESSION: No acute abnormality. Stable mild changes of COPD and chronic bronchitis. Electronically Signed   By: Beckie Salts M.D.   On: 05/15/2017 17:13    Procedures Procedures (including critical care time)  Medications Ordered in ED Medications  albuterol (PROVENTIL,VENTOLIN) solution continuous neb (10 mg/hr Nebulization New Bag/Given 05/15/17 1747)  albuterol (PROVENTIL HFA;VENTOLIN HFA) 108 (90 Base) MCG/ACT inhaler 1-2 puff (not administered)  albuterol (PROVENTIL) (2.5 MG/3ML) 0.083% nebulizer solution 5 mg (5 mg Nebulization Given 05/15/17 1618)  methylPREDNISolone sodium succinate (SOLU-MEDROL) 125 mg/2 mL injection 125 mg (125 mg Intravenous Given 05/15/17 1654)  LORazepam (ATIVAN) injection 0.5 mg (0.5 mg Intravenous Given 05/15/17 1654)     Initial Impression / Assessment and Plan / ED Course   I have reviewed the triage vital signs and the nursing notes.  Pertinent labs & imaging results that were available during my care of the patient were reviewed by me and considered in my medical decision making (see chart for details).    Patient appears much more calm. No wheezing appreciated after continuous albuterol treatment. Chest x-ray without evidence of pneumonia. We'll discharge home with short course of steroids. Return precautions given. Advised to follow-up with [facility] for COPD management.    Final Clinical Impressions(s) / ED Diagnoses   Final diagnoses:  COPD exacerbation (HCC)    New Prescriptions New Prescriptions   PREDNISONE (DELTASONE) 20 MG TABLET    3 tabs po day one, then 2 po daily x 4 days     Loren Racer, MD 05/15/17 1907

## 2017-05-15 NOTE — ED Triage Notes (Signed)
Pt reports SOB and coughing x 5 days.  Pt reports chest pain and abd pain from coughing.  Bilateral wheezes heard.  Pt a/o x 4 and ambulatory. Pt reports coughing up yellow mucus.

## 2017-07-14 ENCOUNTER — Emergency Department (HOSPITAL_COMMUNITY)
Admission: EM | Admit: 2017-07-14 | Discharge: 2017-07-14 | Disposition: A | Discharge status: Home / Self Care | Payer: Self-pay | Attending: Emergency Medicine | Admitting: Emergency Medicine

## 2017-07-14 ENCOUNTER — Encounter (HOSPITAL_COMMUNITY): Payer: Self-pay | Admitting: Emergency Medicine

## 2017-07-14 DIAGNOSIS — J449 Chronic obstructive pulmonary disease, unspecified: Secondary | ICD-10-CM | POA: Insufficient documentation

## 2017-07-14 DIAGNOSIS — F17210 Nicotine dependence, cigarettes, uncomplicated: Secondary | ICD-10-CM | POA: Insufficient documentation

## 2017-07-14 DIAGNOSIS — K0889 Other specified disorders of teeth and supporting structures: Secondary | ICD-10-CM | POA: Insufficient documentation

## 2017-07-14 MED ORDER — IBUPROFEN 400 MG PO TABS
ORAL_TABLET | ORAL | Status: AC
  Filled 2017-07-14: qty 1

## 2017-07-14 MED ORDER — IBUPROFEN 400 MG PO TABS
400.0000 mg | ORAL_TABLET | Freq: Once | ORAL | Status: AC
  Administered 2017-07-14: 400 mg via ORAL
  Filled 2017-07-14: qty 1

## 2017-07-14 MED ORDER — CLINDAMYCIN HCL 150 MG PO CAPS: 300.0000 mg | ORAL_CAPSULE | Freq: Three times a day (TID) | ORAL | 0 refills | Status: DC

## 2017-07-14 NOTE — ED Provider Notes (Signed)
[facility]-EMERGENCY DEPT Provider Note   CSN: [id number] Arrival date & time: 07/14/17  0133     History   Chief Complaint Chief Complaint  Patient presents with  . Dental Pain    HPI Linda Foley is a 51 y.o. female.  Patient presents to the emergency department with a dental complaint. Symptoms began yesterday. The patient has tried to alleviate pain with oragel.  Pain rated at a 10/10, characterized as throbbing in nature and located front upper left incisor. Patient denies fever, night sweats, chills, difficulty swallowing or opening mouth, SOB, nuchal rigidity or decreased ROM of neck.  Patient does not have a dentist and requests a resource guide at discharge.    The history is provided by the patient. No language interpreter was used.    Past Medical History:  Diagnosis Date  . Anxiety   . COPD (chronic obstructive pulmonary disease) (HCC)     There are no active problems to display for this patient.   Past Surgical History:  Procedure Laterality Date  . CESAREAN SECTION    . CHOLECYSTECTOMY    . TUBAL LIGATION      OB History    No data available       Home Medications    Prior to Admission medications   Medication Sig Start Date End Date Taking? Authorizing Provider  clindamycin (CLEOCIN) 150 MG capsule Take 2 capsules (300 mg total) by mouth 3 (three) times daily. May dispense as 150mg  capsules 07/14/17   Roxy Horseman, PA-C  cyclobenzaprine (FLEXERIL) 10 MG tablet Take 1 tablet (10 mg total) by mouth 2 (two) times daily as needed for muscle spasms. Patient not taking: Reported on 05/15/2017 05/01/17   Fayrene Helper, PA-C  famotidine (PEPCID) 20 MG tablet Take 1 tablet (20 mg total) by mouth 2 (two) times daily. Patient not taking: Reported on 05/15/2017 03/17/17   Fayrene Helper, PA-C  hydrOXYzine (ATARAX/VISTARIL) 25 MG tablet Take 1 tablet (25 mg total) by mouth every 6 (six) hours. Patient not taking: Reported on 05/15/2017 03/17/17   Fayrene Helper, PA-C    ibuprofen (ADVIL,MOTRIN) 600 MG tablet Take 1 tablet (600 mg total) by mouth every 6 (six) hours as needed. Patient not taking: Reported on 05/15/2017 05/01/17   Fayrene Helper, PA-C  predniSONE (DELTASONE) 20 MG tablet 3 tabs po day one, then 2 po daily x 4 days 05/16/17   Loren Racer, MD    Family History Family History  Problem Relation Age of Onset  . Diabetes Mother     Social History Social History  Substance Use Topics  . Smoking status: Heavy Tobacco Smoker    Packs/day: 2.00    Years: 30.00    Types: Cigarettes  . Smokeless tobacco: Never Used  . Alcohol use No     Allergies   Penicillins   Review of Systems Review of Systems  Constitutional: Negative for chills and fever.  HENT: Positive for dental problem. Negative for drooling.   Neurological: Negative for speech difficulty.  Psychiatric/Behavioral: Positive for sleep disturbance.     Physical Exam Updated Vital Signs BP (!) 122/98 (BP Location: Right Arm)   Pulse 98   Temp 98.4 F (36.9 C) (Oral)   Resp (!) 22   LMP 12/12/2011   SpO2 90%   Physical Exam Physical Exam  Constitutional: Pt appears well-developed and well-nourished.  HENT:  Head: Normocephalic.  Right Ear: Tympanic membrane, external ear and ear canal normal.  Left Ear: Tympanic membrane, external ear and ear  canal normal.  Nose: Nose normal. Right sinus exhibits no maxillary sinus tenderness and no frontal sinus tenderness. Left sinus exhibits no maxillary sinus tenderness and no frontal sinus tenderness.  Mouth/Throat: Uvula is midline, oropharynx is clear and moist and mucous membranes are normal. No oral lesions. No uvula swelling or lacerations. No oropharyngeal exudate, posterior oropharyngeal edema, posterior oropharyngeal erythema or tonsillar abscesses.  Poor dentition No gingival swelling, fluctuance or induration No gross abscess  No sublingual edema, tenderness to palpation, or sign of Ludwig's angina, or deep space  infection Pain at front upper left incisor Eyes: Conjunctivae are normal. Pupils are equal, round, and reactive to light. Right eye exhibits no discharge. Left eye exhibits no discharge.  Neck: Normal range of motion. Neck supple.  No stridor Handling secretions without difficulty No nuchal rigidity No cervical lymphadenopathy Cardiovascular: Normal rate, regular rhythm and normal heart sounds.   Pulmonary/Chest: Effort normal. No respiratory distress.  Equal chest rise  Abdominal: Soft. Bowel sounds are normal. Pt exhibits no distension. There is no tenderness.  Lymphadenopathy: Pt has no cervical adenopathy.  Neurological: Pt is alert and oriented x 4  Skin: Skin is warm and dry.  Psychiatric: Pt has a normal mood and affect.  Nursing note and vitals reviewed.    ED Treatments / Results  Labs (all labs ordered are listed, but only abnormal results are displayed) Labs Reviewed - No data to display  EKG  EKG Interpretation None       Radiology No results found.  Procedures Procedures (including critical care time)  Medications Ordered in ED Medications  ibuprofen (ADVIL,MOTRIN) 400 MG tablet (not administered)  ibuprofen (ADVIL,MOTRIN) tablet 400 mg (not administered)     Initial Impression / Assessment and Plan / ED Course  I have reviewed the triage vital signs and the nursing notes.  Pertinent labs & imaging results that were available during my care of the patient were reviewed by me and considered in my medical decision making (see chart for details).     Patient with dentalgia.  No abscess requiring immediate incision and drainage.  Exam not concerning for Ludwig's angina or pharyngeal abscess.  Will treat with clinda. Pt instructed to follow-up with dentist.  Discussed return precautions. Pt safe for discharge.   Final Clinical Impressions(s) / ED Diagnoses   Final diagnoses:  Pain, dental    New Prescriptions New Prescriptions   CLINDAMYCIN  (CLEOCIN) 150 MG CAPSULE    Take 2 capsules (300 mg total) by mouth 3 (three) times daily. May dispense as 150mg  capsules     Roxy HorsemanBrowning, Berthe Oley, PA-C 07/14/17 [id number]    Ward, Layla MawKristen N, DO 07/14/17 [phone]

## 2017-07-14 NOTE — ED Triage Notes (Signed)
Pt presents to ED for assessment of upper left dental pain starting last night.  No dentist at this time.  Poor dental hygiene and current every day smoker.

## 2017-12-12 ENCOUNTER — Emergency Department (HOSPITAL_COMMUNITY)
Admission: EM | Admit: 2017-12-12 | Discharge: 2017-12-12 | Disposition: A | Discharge status: Home / Self Care | Payer: Self-pay | Attending: Emergency Medicine | Admitting: Emergency Medicine

## 2017-12-12 ENCOUNTER — Encounter (HOSPITAL_COMMUNITY): Payer: Self-pay | Admitting: Emergency Medicine

## 2017-12-12 DIAGNOSIS — Z5321 Procedure and treatment not carried out due to patient leaving prior to being seen by health care provider: Secondary | ICD-10-CM | POA: Insufficient documentation

## 2017-12-12 DIAGNOSIS — R0602 Shortness of breath: Secondary | ICD-10-CM | POA: Insufficient documentation
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
## 2017-12-12 NOTE — ED Notes (Signed)
Patient states that she can no longer wait
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
## 2017-12-12 NOTE — ED Triage Notes (Signed)
Pt brought in from home via [facility]  Pt is c/o productive cough for about a month, generalized weakness for the past 2-3 days so much that she has to have someone bring her a Redbull just so she can get out of bed  Pt is concerned she may have blood poisoning because she has "boils" on her body and when she mashes on them she can feel things moving around under her skin  EMS reports pt has wheezing and rhonchi in her lower lung fields so the administered Albuterol 10mg /Atrovent 0.5mg   Sats 98% on neb tx  Fire dept reported sat of 90% on room air  12 lead unremarkable

## 2018-04-29 ENCOUNTER — Emergency Department (HOSPITAL_COMMUNITY): Payer: Self-pay

## 2018-04-29 ENCOUNTER — Other Ambulatory Visit: Payer: Self-pay

## 2018-04-29 ENCOUNTER — Emergency Department (HOSPITAL_COMMUNITY)
Admission: EM | Admit: 2018-04-29 | Discharge: 2018-04-29 | Discharge status: Against Medical Advice | Payer: Self-pay | Attending: Emergency Medicine | Admitting: Emergency Medicine

## 2018-04-29 ENCOUNTER — Encounter (HOSPITAL_COMMUNITY): Payer: Self-pay

## 2018-04-29 DIAGNOSIS — Z5321 Procedure and treatment not carried out due to patient leaving prior to being seen by health care provider: Secondary | ICD-10-CM | POA: Insufficient documentation

## 2018-04-29 DIAGNOSIS — R0602 Shortness of breath: Secondary | ICD-10-CM | POA: Insufficient documentation

## 2018-04-29 LAB — BASIC METABOLIC PANEL
Anion gap: 7 (ref 5–15)
BUN: 7 mg/dL (ref 6–20)
CO2: 29 mmol/L (ref 22–32)
Calcium: 8.9 mg/dL (ref 8.9–10.3)
Chloride: 105 mmol/L (ref 101–111)
Creatinine, Ser: 0.76 mg/dL (ref 0.44–1.00)
GFR calc Af Amer: >60 mL/min (ref >60)
GFR calc non Af Amer: >60 mL/min (ref >60)
Glucose, Bld: 100 mg/dL — ABNORMAL HIGH (ref 65–99)
Potassium: 3.3 mmol/L — ABNORMAL LOW (ref 3.5–5.1)
Sodium: 141 mmol/L (ref 135–145)

## 2018-04-29 LAB — I-STAT TROPONIN, ED: Troponin i, poc: 0.01 ng/mL (ref 0.00–0.08)

## 2018-04-29 LAB — CBC
HCT: 46 % (ref 36.0–46.0)
Hemoglobin: 15.1 g/dL — ABNORMAL HIGH (ref 12.0–15.0)
MCH: 31.7 pg (ref 26.0–34.0)
MCHC: 32.8 g/dL (ref 30.0–36.0)
MCV: 96.6 fL (ref 78.0–100.0)
Platelets: 272 10*3/uL (ref 150–400)
RBC: 4.76 MIL/uL (ref 3.87–5.11)
RDW: 14.3 % (ref 11.5–15.5)
WBC: 8.5 10*3/uL (ref 4.0–10.5)

## 2018-04-29 MED ORDER — ALBUTEROL SULFATE (2.5 MG/3ML) 0.083% IN NEBU
5.0000 mg | INHALATION_SOLUTION | Freq: Once | RESPIRATORY_TRACT | Status: AC
  Administered 2018-04-29: 5 mg via RESPIRATORY_TRACT
  Filled 2018-04-29: qty 6

## 2018-04-29 NOTE — ED Triage Notes (Signed)
Patient complains of increased SOB with productive sputum for months, states that she continues to smoke with increasing SOB. Also concerned that she has developed bilateral leg pain and vaginal discharge, denies dysuria. Alert and oriented, speaking complete sentences, wheezing noted

## 2018-06-19 ENCOUNTER — Other Ambulatory Visit: Payer: Self-pay

## 2018-06-19 ENCOUNTER — Observation Stay (HOSPITAL_BASED_OUTPATIENT_CLINIC_OR_DEPARTMENT_OTHER)
Admission: EM | Admit: 2018-06-19 | Discharge: 2018-06-21 | Disposition: A | Discharge status: Home / Self Care | Payer: Self-pay | Attending: Internal Medicine | Admitting: Internal Medicine

## 2018-06-19 ENCOUNTER — Encounter (HOSPITAL_BASED_OUTPATIENT_CLINIC_OR_DEPARTMENT_OTHER): Payer: Self-pay

## 2018-06-19 ENCOUNTER — Emergency Department (HOSPITAL_BASED_OUTPATIENT_CLINIC_OR_DEPARTMENT_OTHER): Payer: Self-pay

## 2018-06-19 DIAGNOSIS — F1210 Cannabis abuse, uncomplicated: Secondary | ICD-10-CM | POA: Insufficient documentation

## 2018-06-19 DIAGNOSIS — F1910 Other psychoactive substance abuse, uncomplicated: Secondary | ICD-10-CM

## 2018-06-19 DIAGNOSIS — F1410 Cocaine abuse, uncomplicated: Secondary | ICD-10-CM | POA: Insufficient documentation

## 2018-06-19 DIAGNOSIS — F17210 Nicotine dependence, cigarettes, uncomplicated: Secondary | ICD-10-CM | POA: Insufficient documentation

## 2018-06-19 DIAGNOSIS — Z880 Allergy status to penicillin: Secondary | ICD-10-CM | POA: Insufficient documentation

## 2018-06-19 DIAGNOSIS — J441 Chronic obstructive pulmonary disease with (acute) exacerbation: Principal | ICD-10-CM | POA: Insufficient documentation

## 2018-06-19 DIAGNOSIS — Z9119 Patient's noncompliance with other medical treatment and regimen: Secondary | ICD-10-CM | POA: Insufficient documentation

## 2018-06-19 DIAGNOSIS — J810 Acute pulmonary edema: Secondary | ICD-10-CM

## 2018-06-19 DIAGNOSIS — Z79899 Other long term (current) drug therapy: Secondary | ICD-10-CM | POA: Insufficient documentation

## 2018-06-19 DIAGNOSIS — R0902 Hypoxemia: Secondary | ICD-10-CM | POA: Insufficient documentation

## 2018-06-19 DIAGNOSIS — F419 Anxiety disorder, unspecified: Secondary | ICD-10-CM | POA: Insufficient documentation

## 2018-06-19 DIAGNOSIS — Z91199 Patient's noncompliance with other medical treatment and regimen due to unspecified reason: Secondary | ICD-10-CM

## 2018-06-19 HISTORY — DX: Other psychoactive substance abuse, uncomplicated: F19.10

## 2018-06-19 LAB — CBC WITH DIFFERENTIAL/PLATELET
Basophils Absolute: 0 10*3/uL (ref 0.0–0.1)
Basophils Relative: 0 %
EOS PCT: 1 %
Eosinophils Absolute: 0.1 10*3/uL (ref 0.0–0.7)
HEMATOCRIT: 44.6 % (ref 36.0–46.0)
Hemoglobin: 15 g/dL (ref 12.0–15.0)
LYMPHS ABS: 1.8 10*3/uL (ref 0.7–4.0)
LYMPHS PCT: 12 %
MCH: 31.8 pg (ref 26.0–34.0)
MCHC: 33.6 g/dL (ref 30.0–36.0)
MCV: 94.7 fL (ref 78.0–100.0)
Monocytes Absolute: 1.7 10*3/uL — ABNORMAL HIGH (ref 0.1–1.0)
Monocytes Relative: 12 %
Neutro Abs: 10.7 10*3/uL — ABNORMAL HIGH (ref 1.7–7.7)
Neutrophils Relative %: 75 %
Platelets: 203 10*3/uL (ref 150–400)
RBC: 4.71 MIL/uL (ref 3.87–5.11)
RDW: 13.3 % (ref 11.5–15.5)
WBC: 14.3 10*3/uL — ABNORMAL HIGH (ref 4.0–10.5)

## 2018-06-19 MED ORDER — ALBUTEROL SULFATE (2.5 MG/3ML) 0.083% IN NEBU
5.0000 mg | INHALATION_SOLUTION | Freq: Once | RESPIRATORY_TRACT | Status: AC
  Administered 2018-06-19: 5 mg via RESPIRATORY_TRACT
  Filled 2018-06-19: qty 6

## 2018-06-19 MED ORDER — METHYLPREDNISOLONE SODIUM SUCC 125 MG IJ SOLR
125.0000 mg | Freq: Once | INTRAMUSCULAR | Status: AC
  Administered 2018-06-19: 125 mg via INTRAVENOUS
  Filled 2018-06-19: qty 2

## 2018-06-19 MED ORDER — IPRATROPIUM BROMIDE 0.02 % IN SOLN
0.5000 mg | Freq: Once | RESPIRATORY_TRACT | Status: AC
  Administered 2018-06-19: 0.5 mg via RESPIRATORY_TRACT
  Filled 2018-06-19: qty 2.5

## 2018-06-19 NOTE — ED Triage Notes (Addendum)
Pt c/o ShOB x 2 days ago. Pt is obviously labored. Pt has a hx of COPD. Pt states she has been out of her med "for a long time."

## 2018-06-20 ENCOUNTER — Encounter (HOSPITAL_BASED_OUTPATIENT_CLINIC_OR_DEPARTMENT_OTHER): Payer: Self-pay | Admitting: Emergency Medicine

## 2018-06-20 DIAGNOSIS — Z9119 Patient's noncompliance with other medical treatment and regimen: Secondary | ICD-10-CM

## 2018-06-20 DIAGNOSIS — J810 Acute pulmonary edema: Secondary | ICD-10-CM

## 2018-06-20 DIAGNOSIS — F1910 Other psychoactive substance abuse, uncomplicated: Secondary | ICD-10-CM | POA: Diagnosis present

## 2018-06-20 DIAGNOSIS — J441 Chronic obstructive pulmonary disease with (acute) exacerbation: Secondary | ICD-10-CM | POA: Diagnosis present

## 2018-06-20 LAB — BASIC METABOLIC PANEL
Anion gap: 7 (ref 5–15)
BUN: 11 mg/dL (ref 6–20)
CALCIUM: 8.4 mg/dL — AB (ref 8.9–10.3)
CO2: 26 mmol/L (ref 22–32)
CREATININE: 0.7 mg/dL (ref 0.44–1.00)
Chloride: 104 mmol/L (ref 98–111)
GFR calc Af Amer: >60 mL/min (ref >60)
GFR calc non Af Amer: >60 mL/min (ref >60)
GLUCOSE: 98 mg/dL (ref 70–99)
Potassium: 3.5 mmol/L (ref 3.5–5.1)
Sodium: 137 mmol/L (ref 135–145)

## 2018-06-20 LAB — BRAIN NATRIURETIC PEPTIDE: B Natriuretic Peptide: 38.5 pg/mL (ref 0.0–100.0)

## 2018-06-20 LAB — RAPID URINE DRUG SCREEN, HOSP PERFORMED
Amphetamines: NOT DETECTED
Benzodiazepines: NOT DETECTED
COCAINE: POSITIVE — AB
Opiates: NOT DETECTED
Tetrahydrocannabinol: POSITIVE — AB

## 2018-06-20 LAB — TROPONIN I: Troponin I: <0.03 ng/mL (ref <0.03)

## 2018-06-20 LAB — MAGNESIUM: Magnesium: 2.1 mg/dL (ref 1.7–2.4)

## 2018-06-20 LAB — GLUCOSE, CAPILLARY: Glucose-Capillary: 209 mg/dL — ABNORMAL HIGH (ref 70–99)

## 2018-06-20 LAB — HIV ANTIBODY (ROUTINE TESTING W REFLEX): HIV Screen 4th Generation wRfx: NONREACTIVE

## 2018-06-20 MED ORDER — IPRATROPIUM-ALBUTEROL 0.5-2.5 (3) MG/3ML IN SOLN
3.0000 mL | Freq: Four times a day (QID) | RESPIRATORY_TRACT | Status: DC
  Administered 2018-06-20: 3 mL via RESPIRATORY_TRACT
  Filled 2018-06-20: qty 3

## 2018-06-20 MED ORDER — ACETAMINOPHEN 325 MG PO TABS
650.0000 mg | ORAL_TABLET | Freq: Four times a day (QID) | ORAL | Status: DC | PRN
  Administered 2018-06-20: 650 mg via ORAL
  Filled 2018-06-20: qty 2

## 2018-06-20 MED ORDER — PREDNISONE 20 MG PO TABS
40.0000 mg | ORAL_TABLET | Freq: Every day | ORAL | Status: DC
  Administered 2018-06-20 – 2018-06-21 (×2): 40 mg via ORAL
  Filled 2018-06-20 (×2): qty 2

## 2018-06-20 MED ORDER — AZITHROMYCIN 250 MG PO TABS
250.0000 mg | ORAL_TABLET | Freq: Every day | ORAL | Status: DC
  Administered 2018-06-21: 250 mg via ORAL
  Filled 2018-06-20: qty 1

## 2018-06-20 MED ORDER — IPRATROPIUM-ALBUTEROL 0.5-2.5 (3) MG/3ML IN SOLN
3.0000 mL | Freq: Four times a day (QID) | RESPIRATORY_TRACT | Status: DC
  Administered 2018-06-20 (×2): 3 mL via RESPIRATORY_TRACT
  Filled 2018-06-20 (×2): qty 3

## 2018-06-20 MED ORDER — SODIUM CHLORIDE 0.9 % IV SOLN
500.0000 mg | Freq: Once | INTRAVENOUS | Status: AC
  Administered 2018-06-20: 500 mg via INTRAVENOUS
  Filled 2018-06-20: qty 500

## 2018-06-20 MED ORDER — IPRATROPIUM BROMIDE 0.02 % IN SOLN: 0.5000 mg | Freq: Four times a day (QID) | RESPIRATORY_TRACT | Status: DC

## 2018-06-20 MED ORDER — OXYCODONE HCL 5 MG PO TABS
5.0000 mg | ORAL_TABLET | Freq: Once | ORAL | Status: AC
  Administered 2018-06-20: 5 mg via ORAL
  Filled 2018-06-20: qty 1

## 2018-06-20 MED ORDER — KETOROLAC TROMETHAMINE 15 MG/ML IJ SOLN
15.0000 mg | Freq: Four times a day (QID) | INTRAMUSCULAR | Status: DC | PRN
  Administered 2018-06-20: 15 mg via INTRAVENOUS
  Administered 2018-06-20 – 2018-06-21 (×2): 30 mg via INTRAVENOUS
  Filled 2018-06-20: qty 2
  Filled 2018-06-20: qty 1
  Filled 2018-06-20: qty 2

## 2018-06-20 MED ORDER — ONDANSETRON HCL 4 MG PO TABS: 4.0000 mg | ORAL_TABLET | Freq: Four times a day (QID) | ORAL | Status: DC | PRN

## 2018-06-20 MED ORDER — ACETAMINOPHEN 650 MG RE SUPP: 650.0000 mg | Freq: Four times a day (QID) | RECTAL | Status: DC | PRN

## 2018-06-20 MED ORDER — AZITHROMYCIN 250 MG PO TABS: 500.0000 mg | ORAL_TABLET | Freq: Once | ORAL | Status: DC

## 2018-06-20 MED ORDER — KETOROLAC TROMETHAMINE 30 MG/ML IJ SOLN
15.0000 mg | Freq: Once | INTRAMUSCULAR | Status: AC
  Administered 2018-06-20: 15 mg via INTRAVENOUS
  Filled 2018-06-20: qty 1

## 2018-06-20 MED ORDER — MOMETASONE FURO-FORMOTEROL FUM 200-5 MCG/ACT IN AERO
2.0000 | INHALATION_SPRAY | Freq: Two times a day (BID) | RESPIRATORY_TRACT | Status: DC
  Administered 2018-06-20 – 2018-06-21 (×3): 2 via RESPIRATORY_TRACT
  Filled 2018-06-20: qty 8.8

## 2018-06-20 MED ORDER — IPRATROPIUM-ALBUTEROL 0.5-2.5 (3) MG/3ML IN SOLN
3.0000 mL | Freq: Three times a day (TID) | RESPIRATORY_TRACT | Status: DC
  Administered 2018-06-21: 3 mL via RESPIRATORY_TRACT
  Filled 2018-06-20: qty 3

## 2018-06-20 MED ORDER — FUROSEMIDE 10 MG/ML IJ SOLN
40.0000 mg | Freq: Once | INTRAMUSCULAR | Status: AC
  Administered 2018-06-20: 40 mg via INTRAVENOUS
  Filled 2018-06-20: qty 4

## 2018-06-20 MED ORDER — ALBUTEROL SULFATE (2.5 MG/3ML) 0.083% IN NEBU: 2.5000 mg | INHALATION_SOLUTION | RESPIRATORY_TRACT | Status: DC | PRN

## 2018-06-20 MED ORDER — ONDANSETRON HCL 4 MG/2ML IJ SOLN: 4.0000 mg | Freq: Four times a day (QID) | INTRAMUSCULAR | Status: DC | PRN

## 2018-06-20 MED ORDER — ACETAMINOPHEN 500 MG PO TABS
1000.0000 mg | ORAL_TABLET | Freq: Once | ORAL | Status: AC
  Administered 2018-06-20: 1000 mg via ORAL
  Filled 2018-06-20: qty 2

## 2018-06-20 MED ORDER — TRAZODONE HCL 50 MG PO TABS
50.0000 mg | ORAL_TABLET | Freq: Once | ORAL | Status: AC
  Administered 2018-06-20: 50 mg via ORAL
  Filled 2018-06-20: qty 1

## 2018-06-20 MED ORDER — AZITHROMYCIN 500 MG IV SOLR
INTRAVENOUS | Status: AC
  Filled 2018-06-20: qty 500

## 2018-06-20 MED ORDER — NICOTINE 21 MG/24HR TD PT24
21.0000 mg | MEDICATED_PATCH | Freq: Every day | TRANSDERMAL | Status: DC
  Administered 2018-06-20 – 2018-06-21 (×4): 21 mg via TRANSDERMAL
  Filled 2018-06-20 (×3): qty 1

## 2018-06-20 MED ORDER — ENOXAPARIN SODIUM 60 MG/0.6ML ~~LOC~~ SOLN
60.0000 mg | SUBCUTANEOUS | Status: DC
  Administered 2018-06-20 – 2018-06-21 (×2): 60 mg via SUBCUTANEOUS
  Filled 2018-06-20 (×2): qty 0.6

## 2018-06-20 MED ORDER — AZITHROMYCIN 250 MG PO TABS: 250.0000 mg | ORAL_TABLET | Freq: Every day | ORAL | Status: DC

## 2018-06-20 MED ORDER — IPRATROPIUM-ALBUTEROL 0.5-2.5 (3) MG/3ML IN SOLN: 3.0000 mL | RESPIRATORY_TRACT | Status: DC | PRN

## 2018-06-20 MED ORDER — FLUCONAZOLE 200 MG PO TABS
200.0000 mg | ORAL_TABLET | Freq: Once | ORAL | Status: AC
  Administered 2018-06-20: 200 mg via ORAL
  Filled 2018-06-20: qty 1

## 2018-06-20 NOTE — H&P (Signed)
History and Physical    Linda Saxatricia A Streight ZOX:[id number] DOB: 10-Jan-1966 DOA: 06/19/2018  PCP: Patient, No Pcp Per  [facility]  I have personally briefly reviewed patient[facility]  Chief Complaint: SOB  HPI: Linda Foley is a 52 y.o. female with medical history significant of COPD, polysubstance abuse, medical non-compliance.  Patient presents to the ED with c/o SOB.  Symptoms onset 2 days ago.  She has long since run out of all home meds / nebs as she doesn't see a PCP (just comes to the ED).  No fever, chills, orthopnea, CP, leg swelling, vomiting, abd pain.  Does have BLE pain, especially with activity or exertion, "cramps".   ED Course: Breathing improved post steroids, neb treatments, and single dose of 40 IV lasix after her CXR suggested pulmonary edema.  BNP was only 36 and trop neg however.   Review of Systems: As per HPI otherwise 10 point review of systems negative.   Past Medical History:  Diagnosis Date  . Anxiety   . COPD (chronic obstructive pulmonary disease) (HCC)   . Polysubstance abuse [facility](HCC)     Past Surgical History:  Procedure Laterality Date  . CESAREAN SECTION    . CHOLECYSTECTOMY    . TUBAL LIGATION       reports that she has been smoking cigarettes.  She has a 60.00 pack-year smoking history. She has never used smokeless tobacco. She reports that she does not drink alcohol or use drugs.  Allergies  Allergen Reactions  . Penicillins Nausea And Vomiting and Other (See Comments)    Has patient had a PCN reaction causing immediate rash, facial/tongue/throat swelling, SOB or lightheadedness with hypotension: no Has patient had a PCN reaction causing severe rash involving mucus membranes or skin necrosis: no Has patient had a PCN reaction that required hospitalization no Has patient had a PCN reaction occurring within the last 10 years: unknown If all of the above answers are "NO", then may proceed  with Cephalosporin use.     Family History  Problem Relation Age of Onset  . Diabetes Mother      Prior to Admission medications   Medication Sig Start Date End Date Taking? Authorizing Provider  clindamycin (CLEOCIN) 150 MG capsule Take 2 capsules (300 mg total) by mouth 3 (three) times daily. May dispense as 150mg  capsules Patient not taking: Reported on 04/29/2018 07/14/17   Roxy HorsemanBrowning, Robert, PA-C    Physical Exam: Vitals:   06/20/18 0158 06/20/18 0200 06/20/18 0234 06/20/18 0351  BP:  104/66 115/73 103/74  Pulse:  88 93 93  Resp:  17 (!) 24 19  Temp:    98.2 F (36.8 C)  TempSrc:    Oral  SpO2: 93% 92% 92% 94%  Weight:    116.7 kg (257 lb 4.4 oz)  Height:    5\' 2"  (1.575 m)    Constitutional: NAD, calm, comfortable Eyes: PERRL, lids and conjunctivae normal ENMT: Mucous membranes are moist. Posterior pharynx clear of any exudate or lesions.Normal dentition.  Neck: normal, supple, no masses, no thyromegaly Respiratory: clear to auscultation bilaterally, no wheezing, no crackles. Normal respiratory effort. No accessory muscle use.  Cardiovascular: Regular rate and rhythm, no murmurs / rubs / gallops. No extremity edema. 2+ pedal pulses. No carotid bruits.  Abdomen: no tenderness, no masses palpated. No hepatosplenomegaly. Bowel sounds positive.  Musculoskeletal: no clubbing / cyanosis. No joint deformity upper and lower extremities. Good ROM, no contractures. Normal muscle  tone.  Skin: no rashes, lesions, ulcers. No induration Neurologic: CN 2-12 grossly intact. Sensation intact, DTR normal. Strength 5/5 in all 4.  Psychiatric: Normal judgment and insight. Alert and oriented x 3. Normal mood.    Labs on Admission: I have personally reviewed following labs and imaging studies  CBC: Recent Labs  Lab 06/19/18 2341  WBC 14.3*  NEUTROABS 10.7*  HGB 15.0  HCT 44.6  MCV 94.7  PLT 203   Basic Metabolic Panel: Recent Labs  Lab 06/19/18 2341  NA 137  K 3.5  CL 104    CO2 26  GLUCOSE 98  BUN 11  CREATININE 0.70  CALCIUM 8.4*  MG 2.1   GFR: Estimated Creatinine Clearance: 101.9 mL/min (by C-G formula based on SCr of 0.7 mg/dL). Liver Function Tests: No results for input(s): AST, ALT, ALKPHOS, BILITOT, PROT, ALBUMIN in the last 168 hours. No results for input(s): LIPASE, AMYLASE in the last 168 hours. No results for input(s): AMMONIA in the last 168 hours. Coagulation Profile: No results for input(s): INR, PROTIME in the last 168 hours. Cardiac Enzymes: Recent Labs  Lab 06/19/18 2341  TROPONINI <0.03   BNP (last 3 results) No results for input(s): PROBNP in the last 8760 hours. HbA1C: No results for input(s): HGBA1C in the last 72 hours. CBG: No results for input(s): GLUCAP in the last 168 hours. Lipid Profile: No results for input(s): CHOL, HDL, LDLCALC, TRIG, CHOLHDL, LDLDIRECT in the last 72 hours. Thyroid Function Tests: No results for input(s): TSH, T4TOTAL, FREET4, T3FREE, THYROIDAB in the last 72 hours. Anemia Panel: No results for input(s): VITAMINB12, FOLATE, FERRITIN, TIBC, IRON, RETICCTPCT in the last 72 hours. Urine analysis:    Component Value Date/Time   COLORURINE YELLOW 10/15/2016 1254   APPEARANCEUR CLOUDY (A) 10/15/2016 1254   LABSPEC 1.015 10/15/2016 1254   PHURINE 7.0 10/15/2016 1254   GLUCOSEU NEGATIVE 10/15/2016 1254   HGBUR SMALL (A) 10/15/2016 1254   BILIRUBINUR NEGATIVE 10/15/2016 1254   KETONESUR NEGATIVE 10/15/2016 1254   PROTEINUR NEGATIVE 10/15/2016 1254   NITRITE POSITIVE (A) 10/15/2016 1254   LEUKOCYTESUR TRACE (A) 10/15/2016 1254    Radiological Exams on Admission: Dg Chest Portable 1 View  Result Date: 06/19/2018 CLINICAL DATA:  Bilateral leg pain and chest pain with cough and congestion. EXAM: PORTABLE CHEST 1 VIEW COMPARISON:  04/29/2018 FINDINGS: Emphysematous hyperinflation of lungs. Interval slight increase in interstitial edema. No alveolar consolidation, effusion or pneumothorax. Heart and  mediastinal contours are stable. No acute osseous abnormality is seen. IMPRESSION: Stable hyperinflated appearance of the lungs. Interval development of mild interstitial edema and vascular congestion. Electronically Signed   By: Tollie Eth M.D.   On: 06/19/2018 23:44    EKG: Independently reviewed.  Assessment/Plan Principal Problem:   COPD with acute exacerbation (HCC) Active Problems:   Polysubstance abuse (HCC)    1. COPD with acute exacerbation - 1. COPD pathway 2. Prednisone 3. Adult wheeze protocol 4. Scheduled and PRN nebs 5. Nicotine patch 6. Zpak 2. Polysubstance abuse - 1. Includes 2PPD smoking, cocaine, THC 2. Needs to quit smoking (all of the above preferably) 3. Breathing improved post lasix  DVT prophylaxis: Lovenox Code Status: Full Family Communication: No family in room Disposition Plan: Home after admit Consults called: None Admission status: Place in obs   GARDNER, Heywood Iles. DO [facility] Pager [phone] Only works nights!  If 7AM-7PM, please contact the primary day team physician taking care of patient  [email or web address] Password [facility]  06/20/2018, 4:03 AM

## 2018-06-20 NOTE — Progress Notes (Signed)
Pt c/o severe pain and muscle spasms in both legs. Dr. Nelson ChimesAmin made aware. One order given for oxycodone. Derinda SisVera Leylah Tarnow,rn.

## 2018-06-20 NOTE — ED Notes (Signed)
Pt states that she does not go to a PCP because she does not like them. She states they will just put her in hospice if she goes to see one. Pt states she gets all of her medication and care from the ED because she likes us better.

## 2018-06-20 NOTE — ED Notes (Signed)
Pt c/o continued pain in legs and feet. Pt states that her toes are cramping. Pt provided with hot pack for comfort. EDP notified of pt's pain. See new order.

## 2018-06-20 NOTE — ED Provider Notes (Signed)
[facility] EMERGENCY DEPARTMENT Provider Note   CSN: [id number] Arrival date & time: 06/19/18  2302     History   Chief Complaint Chief Complaint  Patient presents with  . Respiratory Distress    HPI Linda Foley is a 52 y.o. female.  The history is provided by the patient.  Shortness of Breath  This is a chronic (acute 3 days on chronic) problem. The problem occurs continuously.The current episode started more than 2 days ago. The problem has been gradually worsening. Associated symptoms include cough and wheezing. Pertinent negatives include no fever, no headaches, no coryza, no rhinorrhea, no swollen glands, no PND, no orthopnea, no chest pain, no syncope, no vomiting, no abdominal pain, no leg pain, no leg swelling and no claudication. It is unknown what precipitated the problem. Risk factors include smoking (polysubstance abuse). She has tried nothing (is non compliant with her medications) for the symptoms. The treatment provided no relief. She has had prior ED visits.    Past Medical History:  Diagnosis Date  . Anxiety   . COPD (chronic obstructive pulmonary disease) (HCC)   . Polysubstance abuse [facility])     Patient Active Problem List   Diagnosis Date Noted  . COPD with acute exacerbation (HCC) 06/20/2018    Past Surgical History:  Procedure Laterality Date  . CESAREAN SECTION    . CHOLECYSTECTOMY    . TUBAL LIGATION       OB History   None      Home Medications    Prior to Admission medications   Medication Sig Start Date End Date Taking? Authorizing Provider  clindamycin (CLEOCIN) 150 MG capsule Take 2 capsules (300 mg total) by mouth 3 (three) times daily. May dispense as 150mg  capsules Patient not taking: Reported on 04/29/2018 07/14/17   Roxy Horseman, PA-C  cyclobenzaprine (FLEXERIL) 10 MG tablet Take 1 tablet (10 mg total) by mouth 2 (two) times daily as needed for muscle spasms. Patient not taking: Reported on 05/15/2017 05/01/17    Fayrene Helper, PA-C  famotidine (PEPCID) 20 MG tablet Take 1 tablet (20 mg total) by mouth 2 (two) times daily. Patient not taking: Reported on 05/15/2017 03/17/17   Fayrene Helper, PA-C  hydrOXYzine (ATARAX/VISTARIL) 25 MG tablet Take 1 tablet (25 mg total) by mouth every 6 (six) hours. Patient not taking: Reported on 05/15/2017 03/17/17   Fayrene Helper, PA-C  ibuprofen (ADVIL,MOTRIN) 600 MG tablet Take 1 tablet (600 mg total) by mouth every 6 (six) hours as needed. Patient not taking: Reported on 05/15/2017 05/01/17   Fayrene Helper, PA-C  predniSONE (DELTASONE) 20 MG tablet 3 tabs po day one, then 2 po daily x 4 days Patient not taking: Reported on 04/29/2018 05/16/17   Loren Racer, MD    Family History Family History  Problem Relation Age of Onset  . Diabetes Mother     Social History Social History   Tobacco Use  . Smoking status: Heavy Tobacco Smoker    Packs/day: 2.00    Years: 30.00    Pack years: 60.00    Types: Cigarettes  . Smokeless tobacco: Never Used  Substance Use Topics  . Alcohol use: No  . Drug use: No     Allergies   Penicillins   Review of Systems Review of Systems  Constitutional: Negative for diaphoresis and fever.  HENT: Negative for rhinorrhea.   Respiratory: Positive for cough, shortness of breath and wheezing.   Cardiovascular: Negative for chest pain, orthopnea, claudication, leg swelling, syncope  and PND.  Gastrointestinal: Negative for abdominal pain and vomiting.  Neurological: Negative for headaches.  Psychiatric/Behavioral: Negative for self-injury.  All other systems reviewed and are negative.    Physical Exam Updated Vital Signs BP 115/73   Pulse 93   Temp 98.8 F (37.1 C) (Oral)   Resp (!) 24   Ht 5\' 2"  (1.575 m)   Wt 63.5 kg (140 lb)   LMP 12/12/2011   SpO2 92%   BMI 25.61 kg/m   Physical Exam  Constitutional: She is oriented to person, place, and time. She appears well-developed and well-nourished.  Appears intoxicated   HENT:    Head: Normocephalic and atraumatic.  Mouth/Throat: No oropharyngeal exudate.  Eyes: Pupils are equal, round, and reactive to light.  Neck: Normal range of motion. Neck supple. No JVD present.  Cardiovascular: Normal rate, regular rhythm, normal heart sounds and intact distal pulses.  Pulmonary/Chest: Tachypnea noted. She has decreased breath sounds. She has wheezes.  Abdominal: Soft. Bowel sounds are normal. She exhibits no mass. There is no tenderness. There is no guarding.  Musculoskeletal: She exhibits no edema, tenderness or deformity.  Lymphadenopathy:    She has no cervical adenopathy.  Neurological: She is alert and oriented to person, place, and time. She displays normal reflexes.  Skin: Skin is warm. Capillary refill takes less than 2 seconds. She is not diaphoretic.  Psychiatric: She has a normal mood and affect.     ED Treatments / Results  Labs (all labs ordered are listed, but only abnormal results are displayed) Results for orders placed or performed during the hospital encounter of 06/19/18  CBC with Differential/Platelet  Result Value Ref Range   WBC 14.3 (H) 4.0 - 10.5 K/uL   RBC 4.71 3.87 - 5.11 MIL/uL   Hemoglobin 15.0 12.0 - 15.0 g/dL   HCT 46.944.6 62.936.0 - 52.846.0 %   MCV 94.7 78.0 - 100.0 fL   MCH 31.8 26.0 - 34.0 pg   MCHC 33.6 30.0 - 36.0 g/dL   RDW 41.313.3 24.411.5 - 01.015.5 %   Platelets 203 150 - 400 K/uL   Neutrophils Relative % 75 %   Neutro Abs 10.7 (H) 1.7 - 7.7 K/uL   Lymphocytes Relative 12 %   Lymphs Abs 1.8 0.7 - 4.0 K/uL   Monocytes Relative 12 %   Monocytes Absolute 1.7 (H) 0.1 - 1.0 K/uL   Eosinophils Relative 1 %   Eosinophils Absolute 0.1 0.0 - 0.7 K/uL   Basophils Relative 0 %   Basophils Absolute 0.0 0.0 - 0.1 K/uL  Basic metabolic panel  Result Value Ref Range   Sodium 137 135 - 145 mmol/L   Potassium 3.5 3.5 - 5.1 mmol/L   Chloride 104 98 - 111 mmol/L   CO2 26 22 - 32 mmol/L   Glucose, Bld 98 70 - 99 mg/dL   BUN 11 6 - 20 mg/dL    Creatinine, Ser 2.720.70 0.44 - 1.00 mg/dL   Calcium 8.4 (L) 8.9 - 10.3 mg/dL   GFR calc non Af Amer >60 >60 mL/min   GFR calc Af Amer >60 >60 mL/min   Anion gap 7 5 - 15  Troponin I  Result Value Ref Range   Troponin I <0.03 <0.03 ng/mL  Brain natriuretic peptide  Result Value Ref Range   B Natriuretic Peptide 38.5 0.0 - 100.0 pg/mL  Magnesium  Result Value Ref Range   Magnesium 2.1 1.7 - 2.4 mg/dL  Rapid urine drug screen (hospital performed)  Result Value  Ref Range   Opiates NONE DETECTED NONE DETECTED   Cocaine POSITIVE (A) NONE DETECTED   Benzodiazepines NONE DETECTED NONE DETECTED   Amphetamines NONE DETECTED NONE DETECTED   Tetrahydrocannabinol POSITIVE (A) NONE DETECTED   Barbiturates (A) NONE DETECTED    Result not available. Reagent lot number recalled by manufacturer.   Dg Chest Portable 1 View  Result Date: 06/19/2018 CLINICAL DATA:  Bilateral leg pain and chest pain with cough and congestion. EXAM: PORTABLE CHEST 1 VIEW COMPARISON:  04/29/2018 FINDINGS: Emphysematous hyperinflation of lungs. Interval slight increase in interstitial edema. No alveolar consolidation, effusion or pneumothorax. Heart and mediastinal contours are stable. No acute osseous abnormality is seen. IMPRESSION: Stable hyperinflated appearance of the lungs. Interval development of mild interstitial edema and vascular congestion. Electronically Signed   By: Tollie Eth M.D.   On: 06/19/2018 23:44    EKG EKG Interpretation  Date/Time:  Friday June 19 2018 23:23:31 EDT Ventricular Rate:  93 PR Interval:    QRS Duration: 79 QT Interval:  352 QTC Calculation: 438 R Axis:   85 Text Interpretation:  Sinus rhythm Confirmed by Derionna Salvador ([id number]) on 06/19/2018 11:41:19 PM   Radiology Dg Chest Portable 1 View  Result Date: 06/19/2018 CLINICAL DATA:  Bilateral leg pain and chest pain with cough and congestion. EXAM: PORTABLE CHEST 1 VIEW COMPARISON:  04/29/2018 FINDINGS: Emphysematous hyperinflation of  lungs. Interval slight increase in interstitial edema. No alveolar consolidation, effusion or pneumothorax. Heart and mediastinal contours are stable. No acute osseous abnormality is seen. IMPRESSION: Stable hyperinflated appearance of the lungs. Interval development of mild interstitial edema and vascular congestion. Electronically Signed   By: Tollie Eth M.D.   On: 06/19/2018 23:44    Procedures Procedures (including critical care time)  Medications Ordered in ED Medications  ipratropium-albuterol (DUONEB) 0.5-2.5 (3) MG/3ML nebulizer solution 3 mL (3 mLs Nebulization Given 06/20/18 0158)  azithromycin (ZITHROMAX) 500 mg in sodium chloride 0.9 % 250 mL IVPB (0 mg Intravenous Hold 06/20/18 0228)  azithromycin (ZITHROMAX) 500 MG injection (has no administration in time range)  albuterol (PROVENTIL) (2.5 MG/3ML) 0.083% nebulizer solution 5 mg (5 mg Nebulization Given 06/19/18 2333)  ipratropium (ATROVENT) nebulizer solution 0.5 mg (0.5 mg Nebulization Given 06/19/18 2333)  methylPREDNISolone sodium succinate (SOLU-MEDROL) 125 mg/2 mL injection 125 mg (125 mg Intravenous Given 06/19/18 2340)  ketorolac (TORADOL) 30 MG/ML injection 15 mg (15 mg Intravenous Given 06/20/18 0008)  furosemide (LASIX) injection 40 mg (40 mg Intravenous Given 06/20/18 0030)      Final Clinical Impressions(s) / ED Diagnoses   Final diagnoses:  Acute pulmonary edema (HCC)  COPD exacerbation (HCC)  Polysubstance abuse (HCC)  Noncompliance    Will admit to medicine for medication stabilization.     Catharine Kettlewell, MD 06/20/18 0258

## 2018-06-20 NOTE — Progress Notes (Signed)
Patient admitted by Dr. Julian ReilGardner this morning. Patient seen and examined by me this morning during rounds.  She was admitted for COPD exacerbation and due to noncompliance.  She continues to smoke tobacco.  Her vitals have been reviewed and there is stable at this point.  She is currently saturating greater than 90% on 2-3 L nasal cannula  At this point continue patient on oral prednisone, nebulizer treatments scheduled and as necessary.  Continue Z-Pak.  Nicotine patch as needed Counseled to quit using illicit drug Have advised the nurse to get ambulatory pulse ox tomorrow morning and tried ambulating here in the hallway today.  Please call with questions as needed.

## 2018-06-20 NOTE — Plan of Care (Signed)
52 yo F with advanced COPD, polysubstance abuse, doesn't take any home meds, doesn't follow with PCP because "they tried to send her to hospice" in the past.  In with COPD exacerbation.  Now just on 2L via [place] after neb treatments and improving in ED.  Will send to med-surg.

## 2018-06-20 NOTE — Progress Notes (Signed)
RN gave nebulizer at 23:33

## 2018-06-20 NOTE — ED Notes (Signed)
Pt c/o burning at IV site. Infusion stopped. Minimal reddness noted at IV site, no edema noted. IV d/c and warm compress applied. EDP notified. Will restart IV abx after new IV access gained.

## 2018-06-20 NOTE — Progress Notes (Signed)
Pt ambulated about 150 feet. O2 sats at rest (Prior to ambulation): 90% on Room air. During ambulation: started at 89% and dropped to 84% on room air with ambulation. Remained between 84% - 85% on room air with ambulation. 2 liters of O2 applied: Sats up to 95% on 2l while ambulating. Pt also had some SOB with ambulation. Derinda SisVera Jeray Shugart,rn.

## 2018-06-21 MED ORDER — ALBUTEROL SULFATE HFA 108 (90 BASE) MCG/ACT IN AERS: 2.0000 | INHALATION_SPRAY | Freq: Four times a day (QID) | RESPIRATORY_TRACT | 1 refills | Status: DC | PRN

## 2018-06-21 MED ORDER — AZITHROMYCIN 250 MG PO TABS: 250.0000 mg | ORAL_TABLET | Freq: Every day | ORAL | 0 refills | Status: AC

## 2018-06-21 MED ORDER — IPRATROPIUM-ALBUTEROL 0.5-2.5 (3) MG/3ML IN SOLN: 3.0000 mL | RESPIRATORY_TRACT | 0 refills | Status: DC | PRN

## 2018-06-21 MED ORDER — OXYCODONE HCL 5 MG PO TABS: 5.0000 mg | ORAL_TABLET | Freq: Four times a day (QID) | ORAL | 0 refills | Status: DC | PRN

## 2018-06-21 MED ORDER — PREDNISONE 20 MG PO TABS: 40.0000 mg | ORAL_TABLET | Freq: Every day | ORAL | 0 refills | Status: AC

## 2018-06-21 NOTE — Discharge Summary (Signed)
Physician Discharge Summary  LANAYA BENNIS ZOX:[id number] DOB: 11-08-1966 DOA: 06/19/2018  PCP: Patient, No Pcp Per  Admit date: 06/19/2018 Discharge date: 06/21/2018  Admitted From: Home  Disposition:  Home   Recommendations for Outpatient Follow-up:  1. Follow up with PCP in 1-2 weeks 2. Please obtain BMP/CBC in one week your next doctors visit.  3. Home Oxygen 2L [place]. 4. Prescribed albuterol inhaler, duo nebs, azithromycin and prednisone.  Also given 15 tablets of oxycodone 5 mg IR 5. Advised to follow-up outpatient at community wellness center at [facility].  Home Health: None  Equipment/Devices: Oxygen Discharge Condition: Stable CODE STATUS: Full  Diet recommendation: Regular   Brief/Interim Summary: 52 year old female with history of COPD, polysubstance abuse, medical noncompliance came to the hospital with worsening shortness of breath over the course of 2 days.  Upon admission she was found to be in COPD exacerbation therefore started on steroids and nebulizer treatments.  She was also given 1 dose of IV Lasix in the ER.  Over the course of her hospital stay her breathing symptoms improved but had signs of hypoxia with ambulation.  And a pulse ox showed oxygen level of 86% with improvement after placing on 2 L nasal cannula. With the help of case management will try and make arrangements for home oxygen.   Discharge Diagnoses:  Principal Problem:   COPD with acute exacerbation (HCC) Active Problems:   Polysubstance abuse (HCC)  Acute respiratory distress, improved Acute mild exacerbation of COPD with hypoxia - At this time patient is doing better but requiring 2 L nasal cannula especially with ambulation.  Her breath sounds are better therefore we will discharge her on 3 days of oral prednisone, 3 days of oral azithromycin, nebulizer treatment and inhalers prescribed.  Advised to quit using any illicit drugs and tobacco.  Patient understands this.  Polysubstance  abuse -Counseled to quit using illicit drugs. -Initial UDS positive for cocaine and [facility]  Discharge Instructions   Allergies as of 06/21/2018      Reactions   Penicillins Nausea And Vomiting, Other (See Comments)   Has patient had a PCN reaction causing immediate rash, facial/tongue/throat swelling, SOB or lightheadedness with hypotension: no Has patient had a PCN reaction causing severe rash involving mucus membranes or skin necrosis: no Has patient had a PCN reaction that required hospitalization no Has patient had a PCN reaction occurring within the last 10 years: unknown If all of the above answers are "NO", then may proceed with Cephalosporin use.      Medication List    TAKE these medications   albuterol 108 (90 Base) MCG/ACT inhaler Commonly known as:  PROVENTIL HFA;VENTOLIN HFA Inhale 2 puffs into the lungs every 6 (six) hours as needed for wheezing or shortness of breath.   azithromycin 250 MG tablet Commonly known as:  ZITHROMAX Take 1 tablet (250 mg total) by mouth daily for 3 days.   ipratropium-albuterol 0.5-2.5 (3) MG/3ML Soln Commonly known as:  DUONEB Take 3 mLs by nebulization every 4 (four) hours as needed.   oxyCODONE 5 MG immediate release tablet Commonly known as:  Oxy IR/ROXICODONE Take 1 tablet (5 mg total) by mouth every 6 (six) hours as needed for up to 15 doses for severe pain.   predniSONE 20 MG tablet Commonly known as:  DELTASONE Take 2 tablets (40 mg total) by mouth daily with breakfast for 3 days. Start taking on:  06/22/2018            Durable Medical Equipment  (  From admission, onward)        Start     Ordered   06/21/18 0806  For home use only DME oxygen  Once    Question Answer Comment  Mode or (Route) Nasal cannula   Liters per Minute 2   Frequency Continuous (stationary and portable oxygen unit needed)   Oxygen delivery system Gas      06/21/18 0805     Follow-up Information    [facility].  Schedule an appointment as soon as possible for a visit in 1 week(s).   Contact information: [address] [place] [phone]         Allergies  Allergen Reactions  . Penicillins Nausea And Vomiting and Other (See Comments)    Has patient had a PCN reaction causing immediate rash, facial/tongue/throat swelling, SOB or lightheadedness with hypotension: no Has patient had a PCN reaction causing severe rash involving mucus membranes or skin necrosis: no Has patient had a PCN reaction that required hospitalization no Has patient had a PCN reaction occurring within the last 10 years: unknown If all of the above answers are "NO", then may proceed with Cephalosporin use.     You were cared for by a hospitalist during your hospital stay. If you have any questions about your discharge medications or the care you received while you were in the hospital after you are discharged, you can call the unit and asked to speak with the hospitalist on call if the hospitalist that took care of you is not available. Once you are discharged, your primary care physician will handle any further medical issues. Please note that no refills for any discharge medications will be authorized once you are discharged, as it is imperative that you return to your primary care physician (or establish a relationship with a primary care physician if you do not have one) for your aftercare needs so that they can reassess your need for medications and monitor your lab values.  Consultations:  None   Procedures/Studies: Dg Chest Portable 1 View  Result Date: 06/19/2018 CLINICAL DATA:  Bilateral leg pain and chest pain with cough and congestion. EXAM: PORTABLE CHEST 1 VIEW COMPARISON:  04/29/2018 FINDINGS: Emphysematous hyperinflation of lungs. Interval slight increase in interstitial edema. No alveolar consolidation, effusion or pneumothorax. Heart and mediastinal contours are stable. No  acute osseous abnormality is seen. IMPRESSION: Stable hyperinflated appearance of the lungs. Interval development of mild interstitial edema and vascular congestion. Electronically Signed   By: Tollie Ethavid  Kwon M.D.   On: 06/19/2018 23:44      Subjective: No complaints as she is feeling better symptomatically.  With ambulation on room air she desaturates to 86%.  General = no fevers, chills, dizziness, malaise, fatigue HEENT/EYES = negative for pain, redness, loss of vision, double vision, blurred vision, loss of hearing, sore throat, hoarseness, dysphagia Cardiovascular= negative for chest pain, palpitation, murmurs, lower extremity swelling Respiratory/lungs= negative for shortness of breath, cough, hemoptysis, wheezing, mucus production Gastrointestinal= negative for nausea, vomiting,, abdominal pain, melena, hematemesis Genitourinary= negative for Dysuria, Hematuria, Change in Urinary Frequency MSK = Negative for arthralgia, myalgias, Back Pain, Joint swelling  Neurology= Negative for headache, seizures, numbness, tingling  Psychiatry= Negative for anxiety, depression, suicidal and homocidal ideation Allergy/Immunology= Medication/Food allergy as listed  Skin= Negative for Rash, lesions, ulcers, itching   Discharge Exam: Vitals:   06/20/18 2042 06/21/18 0553  BP: 114/73 108/78  Pulse: 98 (!) 137  Resp:  Temp: 97.9 F (36.6 C) (!) 97.4 F (36.3 C)  SpO2: 95% 99%   Vitals:   06/20/18 1436 06/20/18 1927 06/20/18 2042 06/21/18 0553  BP: 127/78  114/73 108/78  Pulse: 100  98 (!) 137  Resp: 18     Temp: 98.4 F (36.9 C)  97.9 F (36.6 C) (!) 97.4 F (36.3 C)  TempSrc: Oral  Oral Oral  SpO2: 98% 96% 95% 99%  Weight:      Height:        General: Pt is alert, awake, not in acute distress Cardiovascular: RRR, S1/S2 +, no rubs, no gallops Respiratory: Slightly coarse breath sounds at the bottom of the lungs Abdominal: Soft, NT, ND, bowel sounds + Extremities: no edema, no  cyanosis    The results of significant diagnostics from this hospitalization (including imaging, microbiology, ancillary and laboratory) are listed below for reference.     Microbiology: No results found for this or any previous visit (from the past 240 hour(s)).   Labs: BNP (last 3 results) Recent Labs    06/19/18 2341  BNP 38.5   Basic Metabolic Panel: Recent Labs  Lab 06/19/18 2341  NA 137  K 3.5  CL 104  CO2 26  GLUCOSE 98  BUN 11  CREATININE 0.70  CALCIUM 8.4*  MG 2.1   Liver Function Tests: No results for input(s): AST, ALT, ALKPHOS, BILITOT, PROT, ALBUMIN in the last 168 hours. No results for input(s): LIPASE, AMYLASE in the last 168 hours. No results for input(s): AMMONIA in the last 168 hours. CBC: Recent Labs  Lab 06/19/18 2341  WBC 14.3*  NEUTROABS 10.7*  HGB 15.0  HCT 44.6  MCV 94.7  PLT 203   Cardiac Enzymes: Recent Labs  Lab 06/19/18 2341  TROPONINI <0.03   BNP: Invalid input(s): POCBNP CBG: Recent Labs  Lab 06/20/18 0511  GLUCAP 209*   D-Dimer No results for input(s): DDIMER in the last 72 hours. Hgb A1c No results for input(s): HGBA1C in the last 72 hours. Lipid Profile No results for input(s): CHOL, HDL, LDLCALC, TRIG, CHOLHDL, LDLDIRECT in the last 72 hours. Thyroid function studies No results for input(s): TSH, T4TOTAL, T3FREE, THYROIDAB in the last 72 hours.  Invalid input(s): FREET3 Anemia work up No results for input(s): VITAMINB12, FOLATE, FERRITIN, TIBC, IRON, RETICCTPCT in the last 72 hours. Urinalysis    Component Value Date/Time   COLORURINE YELLOW 10/15/2016 1254   APPEARANCEUR CLOUDY (A) 10/15/2016 1254   LABSPEC 1.015 10/15/2016 1254   PHURINE 7.0 10/15/2016 1254   GLUCOSEU NEGATIVE 10/15/2016 1254   HGBUR SMALL (A) 10/15/2016 1254   BILIRUBINUR NEGATIVE 10/15/2016 1254   KETONESUR NEGATIVE 10/15/2016 1254   PROTEINUR NEGATIVE 10/15/2016 1254   NITRITE POSITIVE (A) 10/15/2016 1254   LEUKOCYTESUR TRACE  (A) 10/15/2016 1254   Sepsis Labs Invalid input(s): PROCALCITONIN,  WBC,  LACTICIDVEN Microbiology No results found for this or any previous visit (from the past 240 hour(s)).   Time coordinating discharge:  I have spent 35 minutes face to face with the patient and on the ward discussing the patients care, assessment, plan and disposition with other care givers. >50% of the time was devoted counseling the patient about the risks and benefits of treatment/Discharge disposition and coordinating care.   SIGNED:   Dimple Nanas, MD  [facility] 06/21/2018, 9:11 AM Pager   If 7PM-7AM, please contact night-coverage [email or web address] Password [email or web address]

## 2018-06-21 NOTE — Progress Notes (Signed)
Nurse reviewed discharge instructions with pt.  Pt verbalized understanding of discharge instructions, follow up appointments and new medications.  Prescriptions given and O2 delivered to room prior to discharge.

## 2018-06-21 NOTE — Progress Notes (Signed)
SATURATION QUALIFICATIONS: (This note is used to comply with regulatory documentation for home oxygen)  Patient Saturations on Room Air at Rest = 93%  Patient Saturations on Room Air while Ambulating = 86%  Patient Saturations on 2 Liters of oxygen while Ambulating = 94%  Please briefly explain why patient needs home oxygen: Pt desats quickly while ambulating approximately 80 feet.

## 2018-06-21 NOTE — Care Management Note (Addendum)
Case Management Note  Patient Details  Name: Linda Foley MRN: [id number] Date of Birth: Jul 12, 1966  Subjective/Objective:   COPD exac, polysubstance abuse                 Action/Plan: NCM spoke to pt and states she does not have insurance. Will provided MATCH letter with $3 copay and once a year use. Contacted [facility] for oxygen and neb machine. [facility] will assess pt for their charity program for DME. Pt to contact [facility] to arrange appt. Provided pt with info on [facility] and [facility].   [facility] provided pt with RW.  Expected Discharge Date:  06/21/18               Expected Discharge Plan:  Home/Self Care  In-House Referral:  NA  Discharge planning Services  CM Consult  Post Acute Care Choice:  NA Choice offered to:  NA  DME Arranged:  Oxygen, Nebulizer machine DME Agency:  [facility]  HH Arranged:  NA HH Agency:  NA  Status of Service:  Completed, signed off  If discussed at Long Length of Stay Meetings, dates discussed:    Additional Comments:  Linda Foley, Linda Dearcos Ellen, RN 06/21/2018, 10:15 AM

## 2018-06-22 LAB — URINE CULTURE

## 2018-07-17 ENCOUNTER — Ambulatory Visit (INDEPENDENT_AMBULATORY_CARE_PROVIDER_SITE_OTHER): Payer: Self-pay | Admitting: Physician Assistant

## 2021-05-09 ENCOUNTER — Encounter (HOSPITAL_COMMUNITY): Payer: Self-pay

## 2021-05-09 ENCOUNTER — Other Ambulatory Visit: Payer: Self-pay

## 2021-05-09 ENCOUNTER — Emergency Department (HOSPITAL_COMMUNITY): Payer: Self-pay

## 2021-05-09 ENCOUNTER — Emergency Department (HOSPITAL_COMMUNITY)
Admission: EM | Admit: 2021-05-09 | Discharge: 2021-05-09 | Disposition: A | Discharge status: Home / Self Care | Payer: Self-pay | Attending: Emergency Medicine | Admitting: Emergency Medicine

## 2021-05-09 DIAGNOSIS — Z28310 Unvaccinated for covid-19: Secondary | ICD-10-CM | POA: Insufficient documentation

## 2021-05-09 DIAGNOSIS — Z20822 Contact with and (suspected) exposure to covid-19: Secondary | ICD-10-CM | POA: Insufficient documentation

## 2021-05-09 DIAGNOSIS — J441 Chronic obstructive pulmonary disease with (acute) exacerbation: Secondary | ICD-10-CM | POA: Insufficient documentation

## 2021-05-09 DIAGNOSIS — F17210 Nicotine dependence, cigarettes, uncomplicated: Secondary | ICD-10-CM | POA: Insufficient documentation

## 2021-05-09 DIAGNOSIS — L739 Follicular disorder, unspecified: Secondary | ICD-10-CM | POA: Insufficient documentation

## 2021-05-09 LAB — CBC
HCT: 48.6 % — ABNORMAL HIGH (ref 36.0–46.0)
Hemoglobin: 15.8 g/dL — ABNORMAL HIGH (ref 12.0–15.0)
MCH: 31.2 pg (ref 26.0–34.0)
MCHC: 32.5 g/dL (ref 30.0–36.0)
MCV: 96 fL (ref 80.0–100.0)
Platelets: 232 10*3/uL (ref 150–400)
RBC: 5.06 MIL/uL (ref 3.87–5.11)
RDW: 13.6 % (ref 11.5–15.5)
WBC: 11 10*3/uL — ABNORMAL HIGH (ref 4.0–10.5)
nRBC: 0 % (ref 0.0–0.2)

## 2021-05-09 LAB — COMPREHENSIVE METABOLIC PANEL
ALT: 25 U/L (ref 0–44)
AST: 28 U/L (ref 15–41)
Albumin: 3.9 g/dL (ref 3.5–5.0)
Alkaline Phosphatase: 63 U/L (ref 38–126)
Anion gap: 8 (ref 5–15)
BUN: 11 mg/dL (ref 6–20)
CO2: 26 mmol/L (ref 22–32)
Calcium: 9.1 mg/dL (ref 8.9–10.3)
Chloride: 104 mmol/L (ref 98–111)
Creatinine, Ser: 0.65 mg/dL (ref 0.44–1.00)
GFR, Estimated: >60 mL/min (ref >60)
Glucose, Bld: 77 mg/dL (ref 70–99)
Potassium: 4.7 mmol/L (ref 3.5–5.1)
Sodium: 138 mmol/L (ref 135–145)
Total Bilirubin: 1 mg/dL (ref 0.3–1.2)
Total Protein: 8.3 g/dL — ABNORMAL HIGH (ref 6.5–8.1)

## 2021-05-09 LAB — RESP PANEL BY RT-PCR (FLU A&B, COVID) ARPGX2
Influenza A by PCR: NEGATIVE
Influenza B by PCR: NEGATIVE
SARS Coronavirus 2 by RT PCR: NEGATIVE

## 2021-05-09 MED ORDER — ALBUTEROL SULFATE HFA 108 (90 BASE) MCG/ACT IN AERS
4.0000 | INHALATION_SPRAY | Freq: Once | RESPIRATORY_TRACT | Status: AC
  Administered 2021-05-09: 4 via RESPIRATORY_TRACT
  Filled 2021-05-09: qty 6.7

## 2021-05-09 MED ORDER — PREDNISONE 50 MG PO TABS: 50.0000 mg | ORAL_TABLET | Freq: Every day | ORAL | 0 refills | Status: DC

## 2021-05-09 MED ORDER — DOXYCYCLINE HYCLATE 100 MG PO TABS: 100.0000 mg | ORAL_TABLET | Freq: Two times a day (BID) | ORAL | 0 refills | Status: AC

## 2021-05-09 MED ORDER — PREDNISONE 20 MG PO TABS
60.0000 mg | ORAL_TABLET | Freq: Once | ORAL | Status: AC
  Administered 2021-05-09: 60 mg via ORAL
  Filled 2021-05-09: qty 3

## 2021-05-09 NOTE — ED Notes (Signed)
Patient co having a "skin irritation' under her right arm- appears to be red but no drainage noted

## 2021-05-09 NOTE — ED Provider Notes (Signed)
[facility]-EMERGENCY DEPT Provider Note   CSN: [id number] Arrival date & time: 05/09/21  0849     History Chief Complaint  Patient presents with  . Shortness of Breath  . Cough    Linda Foley is a 55 y.o. female.  HPI   Patient presented to the ED for evaluation of shortness of breath, productive cough and some lesions on her skin.  Patient states her symptoms started about a week ago.  She has had progressive coughing and wheezing.  Patient states she is supposed to be on oxygen but is not using it consistently because of her dog getting at the oxygen tubing.  Patient also ran out of her inhaler.  Patient states she also noticed a rash on her skin.  She has noticed a few small sores.  They are tender.  She denies any fevers.  No leg swelling.  She has not been vaccinated for COVID  Past Medical History:  Diagnosis Date  . Anxiety   . COPD (chronic obstructive pulmonary disease) (HCC)   . Polysubstance abuse [facility])     Patient Active Problem List   Diagnosis Date Noted  . COPD with acute exacerbation (HCC) 06/20/2018  . Polysubstance abuse (HCC) 06/20/2018    Past Surgical History:  Procedure Laterality Date  . CESAREAN SECTION    . CHOLECYSTECTOMY    . TUBAL LIGATION       OB History   No obstetric history on file.     Family History  Problem Relation Age of Onset  . Diabetes Mother     Social History   Tobacco Use  . Smoking status: Heavy Tobacco Smoker    Packs/day: 2.00    Years: 30.00    Pack years: 60.00    Types: Cigarettes  . Smokeless tobacco: Never Used  Vaping Use  . Vaping Use: Never used  Substance Use Topics  . Alcohol use: No  . Drug use: No    Home Medications Prior to Admission medications   Medication Sig Start Date End Date Taking? Authorizing Provider  doxycycline (VIBRA-TABS) 100 MG tablet Take 1 tablet (100 mg total) by mouth 2 (two) times daily for 7 days. 05/09/21 05/16/21 Yes Linwood Dibbles, MD   predniSONE (DELTASONE) 50 MG tablet Take 1 tablet (50 mg total) by mouth daily. 05/09/21  Yes Linwood Dibbles, MD  albuterol (PROVENTIL HFA;VENTOLIN HFA) 108 (90 Base) MCG/ACT inhaler Inhale 2 puffs into the lungs every 6 (six) hours as needed for wheezing or shortness of breath. 06/21/18 07/21/18  Amin, Ankit Chirag, MD  ipratropium-albuterol (DUONEB) 0.5-2.5 (3) MG/3ML SOLN Take 3 mLs by nebulization every 4 (four) hours as needed. 06/21/18 07/21/18  Amin, Loura Halt, MD  oxyCODONE (OXY IR/ROXICODONE) 5 MG immediate release tablet Take 1 tablet (5 mg total) by mouth every 6 (six) hours as needed for up to 15 doses for severe pain. 06/21/18   Dimple Nanas, MD    Allergies    Penicillins  Review of Systems   Review of Systems  All other systems reviewed and are negative.   Physical Exam Updated Vital Signs BP 114/81   Pulse 92   Temp 97.9 F (36.6 C) (Oral)   Resp (!) 31   Ht 1.6 m (5\' 3" )   Wt 63.5 kg   LMP 12/12/2011   SpO2 (!) 89%   BMI 24.80 kg/m   Physical Exam Vitals and nursing note reviewed.  Constitutional:      Appearance: She is  well-developed. She is not diaphoretic.  HENT:     Head: Normocephalic and atraumatic.     Right Ear: External ear normal.     Left Ear: External ear normal.  Eyes:     General: No scleral icterus.       Right eye: No discharge.        Left eye: No discharge.     Conjunctiva/sclera: Conjunctivae normal.  Neck:     Trachea: No tracheal deviation.  Cardiovascular:     Rate and Rhythm: Normal rate and regular rhythm.  Pulmonary:     Effort: Pulmonary effort is normal. No accessory muscle usage or respiratory distress.     Breath sounds: No stridor. Decreased breath sounds and wheezing present. No rales.  Abdominal:     General: Bowel sounds are normal. There is no distension.     Palpations: Abdomen is soft.     Tenderness: There is no abdominal tenderness. There is no guarding or rebound.  Musculoskeletal:        General: No  tenderness.     Cervical back: Neck supple.     Right lower leg: No edema.     Left lower leg: No edema.  Skin:    General: Skin is warm and dry.     Comments: Few small superficial areas of scabs and papules on the torso, few clustered in an area underneath the right arm, not clearly in a dermatomal pattern, not clearly vesicular  Neurological:     Mental Status: She is alert.     Cranial Nerves: No cranial nerve deficit (no facial droop, extraocular movements intact, no slurred speech).     Sensory: No sensory deficit.     Motor: No abnormal muscle tone or seizure activity.     Coordination: Coordination normal.     ED Results / Procedures / Treatments   Labs (all labs ordered are listed, but only abnormal results are displayed) Labs Reviewed  CBC - Abnormal; Notable for the following components:      Result Value   WBC 11.0 (*)    Hemoglobin 15.8 (*)    HCT 48.6 (*)    All other components within normal limits  COMPREHENSIVE METABOLIC PANEL - Abnormal; Notable for the following components:   Total Protein 8.3 (*)    All other components within normal limits  RESP PANEL BY RT-PCR (FLU A&B, COVID) [id number]    EKG None  Radiology DG Chest Portable 1 View  Result Date: 05/09/2021 CLINICAL DATA:  Cough and increasing shortness of breath. History of COPD. EXAM: PORTABLE CHEST 1 VIEW COMPARISON:  Radiographs 06/19/2018 and 04/29/2018. FINDINGS: 1057 hours. Two views submitted. The heart size and mediastinal contours are stable. The lungs remain hyperinflated but clear. There is no pleural effusion or pneumothorax. The bones appear unremarkable. Telemetry leads overlie the chest. IMPRESSION: Stable chest.  No acute cardiopulmonary process. Electronically Signed   By: Carey Bullocks M.D.   On: 05/09/2021 11:24    Procedures Procedures   Medications Ordered in ED Medications  albuterol (VENTOLIN HFA) 108 (90 Base) MCG/ACT inhaler 4 puff (4 puffs Inhalation Given 05/09/21 1102)   predniSONE (DELTASONE) tablet 60 mg (60 mg Oral Given 05/09/21 1102)    ED Course  I have reviewed the triage vital signs and the nursing notes.  Pertinent labs & imaging results that were available during my care of the patient were reviewed by me and considered in my medical decision making (see chart for details).  Clinical  Course as of 05/09/21 1231  Wed May 09, 2021  1224 CBC and metabolic panel unremarkable.  COVID test is negative. [JK]  1225 Chest x-ray without pneumonia. [JK]    Clinical Course User Index [JK] Linwood Dibbles, MD   MDM Rules/Calculators/A&P                          Patient presented to the ED for evaluation of cough and shortness of breath.  Patient does have history of COPD.  She does still smoke and she does have oxygen at home although she does not use it consistently.  Patient's ED work-up does not show any signs of pneumonia.  Patient's oxygen saturation is below normal but she is not having any respiratory distress.  There is no signs of pneumonia on exam.  I suspect her symptoms are related to her COPD exacerbation.  Patient was given albuterol and steroids.  Right now she is anxious to leave because she has to take care of her dog.  I do think as we treat her COPD exacerbation and she uses her supplemental oxygen at home that should improve.  Patient does appear to have some signs of mild folliculitis.  We will give her prescription for doxycycline.  Recommend smoking cessation.  Warning signs and precautions discussed. Final Clinical Impression(s) / ED Diagnoses Final diagnoses:  COPD exacerbation (HCC)  Folliculitis    Rx / DC Orders ED Discharge Orders         Ordered    predniSONE (DELTASONE) 50 MG tablet  Daily        05/09/21 1229    doxycycline (VIBRA-TABS) 100 MG tablet  2 times daily        05/09/21 1229           Linwood Dibbles, MD 05/09/21 1231

## 2021-05-09 NOTE — ED Notes (Signed)
Patient is requesting to be discharged- she is worried about her dog

## 2021-05-09 NOTE — ED Triage Notes (Addendum)
Pt arrived via POV, c/o productive cough, worsening SOB and multiple areas or skin rashes and open areas of skin. States all sx started progressively getting worse x1 week. Speaking in full sentences. Spo2 92% RA  States she is supposed to be on o2 baseline 2L [place] at home but is unable to use it because dog takes it.

## 2021-05-09 NOTE — Discharge Instructions (Signed)
Take the antibiotics as prescribed.  Take the steroids until they are finished.  Continue to use your inhaler.  Try to quit smoking.  Follow-up with your primary care doctor to be rechecked

## 2021-11-13 ENCOUNTER — Other Ambulatory Visit: Payer: Self-pay

## 2021-11-13 ENCOUNTER — Emergency Department (HOSPITAL_COMMUNITY)
Admission: EM | Admit: 2021-11-13 | Discharge: 2021-11-13 | Disposition: A | Discharge status: Home / Self Care | Payer: Self-pay | Attending: Emergency Medicine | Admitting: Emergency Medicine

## 2021-11-13 ENCOUNTER — Emergency Department (HOSPITAL_COMMUNITY): Payer: Self-pay

## 2021-11-13 ENCOUNTER — Encounter (HOSPITAL_COMMUNITY): Payer: Self-pay | Admitting: Emergency Medicine

## 2021-11-13 DIAGNOSIS — M549 Dorsalgia, unspecified: Secondary | ICD-10-CM | POA: Insufficient documentation

## 2021-11-13 DIAGNOSIS — J441 Chronic obstructive pulmonary disease with (acute) exacerbation: Secondary | ICD-10-CM | POA: Insufficient documentation

## 2021-11-13 DIAGNOSIS — Z20822 Contact with and (suspected) exposure to covid-19: Secondary | ICD-10-CM | POA: Insufficient documentation

## 2021-11-13 DIAGNOSIS — L989 Disorder of the skin and subcutaneous tissue, unspecified: Secondary | ICD-10-CM | POA: Insufficient documentation

## 2021-11-13 DIAGNOSIS — F17210 Nicotine dependence, cigarettes, uncomplicated: Secondary | ICD-10-CM | POA: Insufficient documentation

## 2021-11-13 LAB — COMPREHENSIVE METABOLIC PANEL
ALT: 34 U/L (ref 0–44)
AST: 32 U/L (ref 15–41)
Albumin: 4.1 g/dL (ref 3.5–5.0)
Alkaline Phosphatase: 67 U/L (ref 38–126)
Anion gap: 8 (ref 5–15)
BUN: 20 mg/dL (ref 6–20)
CO2: 26 mmol/L (ref 22–32)
Calcium: 9 mg/dL (ref 8.9–10.3)
Chloride: 103 mmol/L (ref 98–111)
Creatinine, Ser: 0.72 mg/dL (ref 0.44–1.00)
GFR, Estimated: >60 mL/min (ref >60)
Glucose, Bld: 91 mg/dL (ref 70–99)
Potassium: 3.6 mmol/L (ref 3.5–5.1)
Sodium: 137 mmol/L (ref 135–145)
Total Bilirubin: 0.7 mg/dL (ref 0.3–1.2)
Total Protein: 7.9 g/dL (ref 6.5–8.1)

## 2021-11-13 LAB — CBC WITH DIFFERENTIAL/PLATELET
Abs Immature Granulocytes: 0.02 10*3/uL (ref 0.00–0.07)
Basophils Absolute: 0.1 10*3/uL (ref 0.0–0.1)
Basophils Relative: 1 %
Eosinophils Absolute: 0.2 10*3/uL (ref 0.0–0.5)
Eosinophils Relative: 2 %
HCT: 52 % — ABNORMAL HIGH (ref 36.0–46.0)
Hemoglobin: 17.1 g/dL — ABNORMAL HIGH (ref 12.0–15.0)
Immature Granulocytes: 0 %
Lymphocytes Relative: 19 %
Lymphs Abs: 1.9 10*3/uL (ref 0.7–4.0)
MCH: 31.2 pg (ref 26.0–34.0)
MCHC: 32.9 g/dL (ref 30.0–36.0)
MCV: 94.9 fL (ref 80.0–100.0)
Monocytes Absolute: 0.9 10*3/uL (ref 0.1–1.0)
Monocytes Relative: 9 %
Neutro Abs: 6.9 10*3/uL (ref 1.7–7.7)
Neutrophils Relative %: 69 %
Platelets: 236 10*3/uL (ref 150–400)
RBC: 5.48 MIL/uL — ABNORMAL HIGH (ref 3.87–5.11)
RDW: 13.2 % (ref 11.5–15.5)
WBC: 9.9 10*3/uL (ref 4.0–10.5)
nRBC: 0 % (ref 0.0–0.2)

## 2021-11-13 LAB — RESP PANEL BY RT-PCR (FLU A&B, COVID) ARPGX2
Influenza A by PCR: NEGATIVE
Influenza B by PCR: NEGATIVE
SARS Coronavirus 2 by RT PCR: NEGATIVE

## 2021-11-13 LAB — BLOOD GAS, VENOUS
Acid-Base Excess: 1.7 mmol/L (ref 0.0–2.0)
Bicarbonate: 27.6 mmol/L (ref 20.0–28.0)
O2 Saturation: 88.9 %
Patient temperature: 98.6
pCO2, Ven: 49.5 mmHg (ref 44.0–60.0)
pH, Ven: 7.366 (ref 7.250–7.430)
pO2, Ven: 58.6 mmHg — ABNORMAL HIGH (ref 32.0–45.0)

## 2021-11-13 LAB — TROPONIN I (HIGH SENSITIVITY)
Troponin I (High Sensitivity): 2 ng/L (ref <18)
Troponin I (High Sensitivity): 3 ng/L (ref <18)

## 2021-11-13 MED ORDER — DOXYCYCLINE HYCLATE 100 MG PO TABS
100.0000 mg | ORAL_TABLET | Freq: Once | ORAL | Status: AC
  Administered 2021-11-13: 100 mg via ORAL
  Filled 2021-11-13: qty 1

## 2021-11-13 MED ORDER — PREDNISONE 20 MG PO TABS: 40.0000 mg | ORAL_TABLET | Freq: Every day | ORAL | 0 refills | Status: DC

## 2021-11-13 MED ORDER — METHYLPREDNISOLONE SODIUM SUCC 125 MG IJ SOLR
125.0000 mg | Freq: Once | INTRAMUSCULAR | Status: AC
  Administered 2021-11-13: 125 mg via INTRAVENOUS
  Filled 2021-11-13: qty 2

## 2021-11-13 MED ORDER — HYDROXYZINE HCL 25 MG PO TABS: 25.0000 mg | ORAL_TABLET | Freq: Four times a day (QID) | ORAL | 0 refills | Status: DC

## 2021-11-13 MED ORDER — DOXYCYCLINE HYCLATE 100 MG PO CAPS: 100.0000 mg | ORAL_CAPSULE | Freq: Two times a day (BID) | ORAL | 0 refills | Status: DC

## 2021-11-13 MED ORDER — ALBUTEROL SULFATE HFA 108 (90 BASE) MCG/ACT IN AERS
2.0000 | INHALATION_SPRAY | Freq: Once | RESPIRATORY_TRACT | Status: AC
  Administered 2021-11-13: 2 via RESPIRATORY_TRACT
  Filled 2021-11-13: qty 6.7

## 2021-11-13 MED ORDER — MAGNESIUM SULFATE 2 GM/50ML IV SOLN
2.0000 g | Freq: Once | INTRAVENOUS | Status: AC
  Administered 2021-11-13: 2 g via INTRAVENOUS
  Filled 2021-11-13: qty 50

## 2021-11-13 MED ORDER — IPRATROPIUM-ALBUTEROL 0.5-2.5 (3) MG/3ML IN SOLN
3.0000 mL | Freq: Once | RESPIRATORY_TRACT | Status: AC
  Administered 2021-11-13: 3 mL via RESPIRATORY_TRACT
  Filled 2021-11-13: qty 3

## 2021-11-13 NOTE — ED Notes (Signed)
Pt states understanding of dc instructions, importance of follow up, and prescription. Pt denies questions or concerns upon dc. Pt declined wheelchair assistance upon dc. Pt ambulated out of ed w/ steady gait. No belongings left in room upon dc.  

## 2021-11-13 NOTE — ED Triage Notes (Signed)
Reports ongoing SOB, CP and an issue with her underarm. Also reports back pain when she coughs. Productive cough w/ thick phlegm.

## 2021-11-13 NOTE — ED Provider Notes (Signed)
Emergency Medicine Provider Triage Evaluation Note  Linda Foley , a 55 y.o. female  was evaluated in triage.  Pt complains of cough and shortness of breath that has been chronic and ongoing for a while but worse over the last couple of weeks.  She states that she is supposed to be on home oxygen but typically does not take it.  Still actively smoking.  Reports associated diffuse chest pains, chills, and copious productive cough.  Review of Systems  Positive:  Negative: Nausea, vomiting, diarrhea, and fever   Physical Exam  BP 117/76   Pulse 93   Temp 98.2 F (36.8 C) (Oral)   Resp (!) 22   Ht 5\' 3"  (1.6 m)   Wt 63.5 kg   LMP 12/12/2011   SpO2 90%   BMI 24.80 kg/m  Gen:   Awake, no distress   Resp:  Tachypneic.  Decreased air movement.  Diffuse rhonchi and wheezes heard throughout. MSK:   Moves extremities without difficulty  Other:    Medical Decision Making  Medically screening exam initiated at 1:31 PM.  Appropriate orders placed.  Linda Foley was informed that the remainder of the evaluation will be completed by another provider, this initial triage assessment does not replace that evaluation, and the importance of remaining in the ED until their evaluation is complete.  88% on room air.  Patient is currently on 4 L of oxygen and around 92%.   Marlowe Sax Crowley, PA-C 11/13/21 1332    11/15/21, MD 11/14/21 1332

## 2021-11-13 NOTE — Discharge Instructions (Signed)
A prescription for steroids, antibiotic and hydroxyzine for the itching was sent to your pharmacy.  Use the inhaler as needed for wheezing and shortness of breath.  Use your oxygen as needed.  Return if you have any issues.  If the area on your arm does not improve with the antibiotic it probably needs to be biopsied by your regular doctor.

## 2021-11-13 NOTE — ED Provider Notes (Signed)
[name]-EMERGENCY DEPT Provider Note   CSN: [id number] Arrival date & time: 11/13/21  1255     History Chief Complaint  Patient presents with   Chest Pain   Back Pain   Shortness of Breath    Linda Foley is a 55 y.o. female.  Patient is a 55 year old female with a history of polysubstance use, COPD on 2 L of oxygen chronically, anxiety who is presenting today with several complaints.  Patient reports for the last 2 weeks she has had worsening cough, congestion, wheezing and shortness of breath.  She is coughing up thick yellow mucus but denies any hemoptysis.  She has dyspnea with exertion with trying to do anything and she finds it difficult to her daily task because she is short of breath.  She does have oxygen at home but her dog likes to chew on the oxygen tubing so she only wears it when she absolutely has to.  She ran out of her inhalers a while ago and has not had them in at least a month.  She has not had any fever but does notice she is having an achy soreness in her chest and her back.  She has no nausea, vomiting, abdominal pain or diarrhea.  She has no swelling in her lower extremities.  She denies a history of any heart problems.  She does continue to use tobacco products. Secondly she is complaining of wounds over her back, upper and lower extremities.  Most specifically is a wound since February on her right axilla.  She has tried to poke it with a needle and reports that it just bleeds.  It is painful to the touch.  She is not sure if it is getting any bigger.  It does not involve her breast.  She does note that her skin will feel itchy and she sometimes will break out in hives.  She does not take anything for this.  She did ask her doctor about it and he told her it was fine but she reports that she feels that he did not really examine it.  The history is provided by the patient.  Chest Pain Associated symptoms: back pain and shortness of breath    Back Pain Associated symptoms: chest pain   Shortness of Breath Associated symptoms: chest pain       Past Medical History:  Diagnosis Date   Anxiety    COPD (chronic obstructive pulmonary disease) (HCC)    Polysubstance abuse (HCC)     Patient Active Problem List   Diagnosis Date Noted   COPD with acute exacerbation (HCC) 06/20/2018   Polysubstance abuse (HCC) 06/20/2018    Past Surgical History:  Procedure Laterality Date   CESAREAN SECTION     CHOLECYSTECTOMY     TUBAL LIGATION       OB History   No obstetric history on file.     Family History  Problem Relation Age of Onset   Diabetes Mother     Social History   Tobacco Use   Smoking status: Heavy Smoker    Packs/day: 2.00    Years: 30.00    Pack years: 60.00    Types: Cigarettes   Smokeless tobacco: Never  Vaping Use   Vaping Use: Never used  Substance Use Topics   Alcohol use: No   Drug use: No    Home Medications Prior to Admission medications   Medication Sig Start Date End Date Taking? Authorizing Provider  albuterol (PROVENTIL  HFA;VENTOLIN HFA) 108 (90 Base) MCG/ACT inhaler Inhale 2 puffs into the lungs every 6 (six) hours as needed for wheezing or shortness of breath. 06/21/18 07/21/18  Amin, Ankit Chirag, MD  ipratropium-albuterol (DUONEB) 0.5-2.5 (3) MG/3ML SOLN Take 3 mLs by nebulization every 4 (four) hours as needed. 06/21/18 07/21/18  Amin, Loura Halt, MD  oxyCODONE (OXY IR/ROXICODONE) 5 MG immediate release tablet Take 1 tablet (5 mg total) by mouth every 6 (six) hours as needed for up to 15 doses for severe pain. Patient not taking: Reported on 11/13/2021 06/21/18   Dimple Nanas, MD  predniSONE (DELTASONE) 50 MG tablet Take 1 tablet (50 mg total) by mouth daily. Patient not taking: Reported on 11/13/2021 05/09/21   Linwood Dibbles, MD    Allergies    Penicillins  Review of Systems   Review of Systems  Respiratory:  Positive for shortness of breath.   Cardiovascular:  Positive for  chest pain.  Musculoskeletal:  Positive for back pain.  All other systems reviewed and are negative.  Physical Exam Updated Vital Signs BP 126/69   Pulse 78   Temp 98.4 F (36.9 C) (Oral)   Resp 19   Ht 5\' 3"  (1.6 m)   Wt 63 kg   LMP 12/12/2011   SpO2 95%   BMI 24.62 kg/m   Physical Exam Vitals and nursing note reviewed.  Constitutional:      General: She is in acute distress.     Appearance: She is well-developed.  HENT:     Head: Normocephalic and atraumatic.  Eyes:     Pupils: Pupils are equal, round, and reactive to light.  Cardiovascular:     Rate and Rhythm: Normal rate and regular rhythm.     Heart sounds: Normal heart sounds. No murmur heard.   No friction rub.  Pulmonary:     Effort: Tachypnea and accessory muscle usage present.     Breath sounds: Wheezing present. No rales.  Abdominal:     General: Bowel sounds are normal. There is no distension.     Palpations: Abdomen is soft.     Tenderness: There is no abdominal tenderness. There is no guarding or rebound.  Musculoskeletal:        General: No tenderness. Normal range of motion.     Cervical back: Normal range of motion and neck supple.     Right lower leg: No edema.     Left lower leg: No edema.     Comments: No edema  Skin:    General: Skin is warm and dry.     Findings: Rash present.     Comments: Ulcerated lesions over the upper back, the right side of the nose, behind the right axilla that are scabbed over without significant erythema  Neurological:     Mental Status: She is alert and oriented to person, place, and time. Mental status is at baseline.     Cranial Nerves: No cranial nerve deficit.  Psychiatric:        Mood and Affect: Mood normal.        Behavior: Behavior normal.    ED Results / Procedures / Treatments   Labs (all labs ordered are listed, but only abnormal results are displayed) Labs Reviewed  CBC WITH DIFFERENTIAL/PLATELET - Abnormal; Notable for the following components:       Result Value   RBC 5.48 (*)    Hemoglobin 17.1 (*)    HCT 52.0 (*)    All other components within normal  limits  BLOOD GAS, VENOUS - Abnormal; Notable for the following components:   pO2, Ven 58.6 (*)    All other components within normal limits  RESP PANEL BY RT-PCR (FLU A&B, COVID) ARPGX2  COMPREHENSIVE METABOLIC PANEL  TROPONIN I (HIGH SENSITIVITY)  TROPONIN I (HIGH SENSITIVITY)    EKG EKG Interpretation  Date/Time:  Tuesday November 13 2021 15:38:03 EST Ventricular Rate:  79 PR Interval:  168 QRS Duration: 92 QT Interval:  397 QTC Calculation: 456 R Axis:   85 Text Interpretation: Sinus rhythm Anterior infarct, old No significant change since last tracing Confirmed by Gwyneth Sprout ([id number]) on 11/13/2021 4:57:41 PM  Radiology DG Chest 2 View  Result Date: 11/13/2021 CLINICAL DATA:  Chest pain and cough. EXAM: CHEST - 2 VIEW COMPARISON:  Chest x-ray dated May 09, 2021. FINDINGS: The heart size and mediastinal contours are within normal limits. Normal pulmonary vascularity. The lungs remain hyperinflated with mild emphysematous changes in the upper lobes. No focal consolidation, pleural effusion, or pneumothorax. No acute osseous abnormality. IMPRESSION: 1. No acute cardiopulmonary disease. 2. COPD. Electronically Signed   By: Obie Dredge M.D.   On: 11/13/2021 14:39    Procedures Procedures   Medications Ordered in ED Medications  albuterol (VENTOLIN HFA) 108 (90 Base) MCG/ACT inhaler 2 puff (has no administration in time range)  ipratropium-albuterol (DUONEB) 0.5-2.5 (3) MG/3ML nebulizer solution 3 mL (3 mLs Nebulization Given 11/13/21 1622)  methylPREDNISolone sodium succinate (SOLU-MEDROL) 125 mg/2 mL injection 125 mg (125 mg Intravenous Given 11/13/21 1630)  magnesium sulfate IVPB 2 g 50 mL (2 g Intravenous New Bag/Given 11/13/21 1632)  doxycycline (VIBRA-TABS) tablet 100 mg (100 mg Oral Given 11/13/21 1622)    ED Course  I have reviewed the triage  vital signs and the nursing notes.  Pertinent labs & imaging results that were available during my care of the patient were reviewed by me and considered in my medical decision making (see chart for details).    MDM Rules/Calculators/A&P                           Patient is a 55 year old female who is presenting today with complaint of shortness of breath and cough.  Patient does have a history of COPD and is on oxygen at home.  She is satting 89 to 92% on her 2 L.  She has diffuse wheezing and coughing.  She denies any infectious symptoms at this time.  She has no history of heart disease.  Patient's EKG today shows no significant findings, chest x-ray without acute findings.  CMP, CBC, troponin and VBG without acute findings.  COVID and flu are negative.  Low suspicion for PE or ACS at this time.  Low suspicion for dissection.  Suspect COPD exacerbation due to ongoing smoking, temperature changes and not having her inhalers.  Patient will be given Solu-Medrol, magnesium, duo nebs.  Will reevaluate after medications.  Also patient has skin lesions that appear to look like areas where she has been picking.  1 area is thickened and indurated and may need biopsy in the future.  Appear to look like early infection and will cover with doxycycline.  6:01 PM On reevaluation patient is feeling much better.  Wheezing is completely resolved.  O2 sats are in the mid 90s off of oxygen.  We will plan on discharging home with albuterol inhaler, steroids and also doxycycline and hydroxyzine for the skin wounds.  MDM   Amount and/or  Complexity of Data Reviewed Clinical lab tests: ordered and reviewed Tests in the radiology section of CPT: ordered and reviewed Tests in the medicine section of CPT: ordered and reviewed Independent visualization of images, tracings, or specimens: yes    Final Clinical Impression(s) / ED Diagnoses Final diagnoses:  COPD exacerbation (HCC)  Skin lesion    Rx / DC  Orders ED Discharge Orders          Ordered    doxycycline (VIBRAMYCIN) 100 MG capsule  2 times daily        11/13/21 1802    predniSONE (DELTASONE) 20 MG tablet  Daily        11/13/21 1802    hydrOXYzine (ATARAX/VISTARIL) 25 MG tablet  Every 6 hours        11/13/21 1802             Gwyneth Sprout, MD 11/13/21 1802

## 2022-02-19 ENCOUNTER — Other Ambulatory Visit: Payer: Self-pay

## 2022-02-19 ENCOUNTER — Encounter (HOSPITAL_COMMUNITY): Payer: Self-pay

## 2022-02-19 ENCOUNTER — Emergency Department (HOSPITAL_COMMUNITY)
Admission: EM | Admit: 2022-02-19 | Discharge: 2022-02-19 | Disposition: A | Discharge status: Home / Self Care | Payer: Self-pay | Attending: Emergency Medicine | Admitting: Emergency Medicine

## 2022-02-19 ENCOUNTER — Emergency Department (HOSPITAL_COMMUNITY): Payer: Self-pay

## 2022-02-19 DIAGNOSIS — Z20822 Contact with and (suspected) exposure to covid-19: Secondary | ICD-10-CM | POA: Insufficient documentation

## 2022-02-19 DIAGNOSIS — Z79899 Other long term (current) drug therapy: Secondary | ICD-10-CM | POA: Insufficient documentation

## 2022-02-19 DIAGNOSIS — J441 Chronic obstructive pulmonary disease with (acute) exacerbation: Secondary | ICD-10-CM | POA: Insufficient documentation

## 2022-02-19 LAB — CBC WITH DIFFERENTIAL/PLATELET
Abs Immature Granulocytes: 0.02 10*3/uL (ref 0.00–0.07)
Basophils Absolute: 0.1 10*3/uL (ref 0.0–0.1)
Basophils Relative: 1 %
Eosinophils Absolute: 0.3 10*3/uL (ref 0.0–0.5)
Eosinophils Relative: 5 %
HCT: 51.7 % — ABNORMAL HIGH (ref 36.0–46.0)
Hemoglobin: 16.7 g/dL — ABNORMAL HIGH (ref 12.0–15.0)
Immature Granulocytes: 0 %
Lymphocytes Relative: 21 %
Lymphs Abs: 1.5 10*3/uL (ref 0.7–4.0)
MCH: 31 pg (ref 26.0–34.0)
MCHC: 32.3 g/dL (ref 30.0–36.0)
MCV: 95.9 fL (ref 80.0–100.0)
Monocytes Absolute: 0.8 10*3/uL (ref 0.1–1.0)
Monocytes Relative: 11 %
Neutro Abs: 4.4 10*3/uL (ref 1.7–7.7)
Neutrophils Relative %: 62 %
Platelets: 243 10*3/uL (ref 150–400)
RBC: 5.39 MIL/uL — ABNORMAL HIGH (ref 3.87–5.11)
RDW: 13.4 % (ref 11.5–15.5)
WBC: 7 10*3/uL (ref 4.0–10.5)
nRBC: 0 % (ref 0.0–0.2)

## 2022-02-19 LAB — TROPONIN I (HIGH SENSITIVITY): Troponin I (High Sensitivity): 3 ng/L (ref <18)

## 2022-02-19 LAB — COMPREHENSIVE METABOLIC PANEL
ALT: 23 U/L (ref 0–44)
AST: 24 U/L (ref 15–41)
Albumin: 3.6 g/dL (ref 3.5–5.0)
Alkaline Phosphatase: 49 U/L (ref 38–126)
Anion gap: 7 (ref 5–15)
BUN: 13 mg/dL (ref 6–20)
CO2: 27 mmol/L (ref 22–32)
Calcium: 8.8 mg/dL — ABNORMAL LOW (ref 8.9–10.3)
Chloride: 104 mmol/L (ref 98–111)
Creatinine, Ser: 0.58 mg/dL (ref 0.44–1.00)
GFR, Estimated: >60 mL/min (ref >60)
Glucose, Bld: 138 mg/dL — ABNORMAL HIGH (ref 70–99)
Potassium: 3.8 mmol/L (ref 3.5–5.1)
Sodium: 138 mmol/L (ref 135–145)
Total Bilirubin: 0.6 mg/dL (ref 0.3–1.2)
Total Protein: 7.3 g/dL (ref 6.5–8.1)

## 2022-02-19 LAB — RESP PANEL BY RT-PCR (FLU A&B, COVID) ARPGX2
Influenza A by PCR: NEGATIVE
Influenza B by PCR: NEGATIVE
SARS Coronavirus 2 by RT PCR: NEGATIVE

## 2022-02-19 LAB — BRAIN NATRIURETIC PEPTIDE: B Natriuretic Peptide: 21.3 pg/mL (ref 0.0–100.0)

## 2022-02-19 MED ORDER — METHYLPREDNISOLONE SODIUM SUCC 125 MG IJ SOLR
125.0000 mg | Freq: Once | INTRAMUSCULAR | Status: AC
  Administered 2022-02-19: 125 mg via INTRAVENOUS
  Filled 2022-02-19: qty 2

## 2022-02-19 MED ORDER — ALBUTEROL SULFATE HFA 108 (90 BASE) MCG/ACT IN AERS
2.0000 | INHALATION_SPRAY | Freq: Once | RESPIRATORY_TRACT | Status: AC
  Administered 2022-02-19: 2 via RESPIRATORY_TRACT
  Filled 2022-02-19: qty 6.7

## 2022-02-19 MED ORDER — MAGNESIUM SULFATE 2 GM/50ML IV SOLN
2.0000 g | Freq: Once | INTRAVENOUS | Status: AC
  Administered 2022-02-19: 2 g via INTRAVENOUS
  Filled 2022-02-19: qty 50

## 2022-02-19 MED ORDER — AZITHROMYCIN 250 MG PO TABS: 250.0000 mg | ORAL_TABLET | Freq: Every day | ORAL | 0 refills | Status: DC

## 2022-02-19 MED ORDER — AEROCHAMBER Z-STAT PLUS/MEDIUM MISC
1.0000 | Freq: Once | Status: AC
  Administered 2022-02-19: 1
  Filled 2022-02-19: qty 1

## 2022-02-19 MED ORDER — IBUPROFEN 800 MG PO TABS
800.0000 mg | ORAL_TABLET | Freq: Once | ORAL | Status: AC
  Administered 2022-02-19: 800 mg via ORAL
  Filled 2022-02-19: qty 1

## 2022-02-19 MED ORDER — IPRATROPIUM-ALBUTEROL 0.5-2.5 (3) MG/3ML IN SOLN
3.0000 mL | RESPIRATORY_TRACT | Status: AC
  Administered 2022-02-19 (×3): 3 mL via RESPIRATORY_TRACT
  Filled 2022-02-19: qty 3

## 2022-02-19 MED ORDER — PREDNISONE 20 MG PO TABS: ORAL_TABLET | ORAL | 0 refills | Status: DC

## 2022-02-19 NOTE — ED Triage Notes (Addendum)
Patient reports a history of COPD and emphysema. Patient states SOB worsened yesterday. Patient states she is normally on O2 4L/min via [place], but ran out of O2 today. Patient also reports that she ran out of her breathing treatments as well. Patient states no breathing treatments for approx a year.  Patient states she has a productive cough with green sputum.

## 2022-02-19 NOTE — ED Provider Notes (Signed)
[facility]-EMERGENCY DEPT Provider Note   CSN: [id number] Arrival date & time: 02/19/22  1535     History  Chief Complaint  Patient presents with   COPD    Linda Foley is a 56 y.o. female.  56 yo F with a chief complaints of difficulty breathing.  The patient tells me it got worse over the past couple days.  She lives in a hotel and has for the past 4 years.  Tells me that she has run out of her home oxygen.  Tells me that the home health organization will not deliver oxygen to a hotel and she also has not been able to get her medications refilled.  She denies any fevers or chills.  Has had a change in sputum increased sputum.   COPD      Home Medications Prior to Admission medications   Medication Sig Start Date End Date Taking? Authorizing Provider  azithromycin (ZITHROMAX) 250 MG tablet Take 1 tablet (250 mg total) by mouth daily. Take first 2 tablets together, then 1 every day until finished. 02/19/22  Yes Melene Plan, DO  predniSONE (DELTASONE) 20 MG tablet 2 tabs po daily x 4 days 02/19/22  Yes Melene Plan, DO  albuterol (PROVENTIL HFA;VENTOLIN HFA) 108 (90 Base) MCG/ACT inhaler Inhale 2 puffs into the lungs every 6 (six) hours as needed for wheezing or shortness of breath. 06/21/18 07/21/18  Amin, Loura Halt, MD  doxycycline (VIBRAMYCIN) 100 MG capsule Take 1 capsule (100 mg total) by mouth 2 (two) times daily. 11/13/21   Gwyneth Sprout, MD  hydrOXYzine (ATARAX/VISTARIL) 25 MG tablet Take 1 tablet (25 mg total) by mouth every 6 (six) hours. 11/13/21   Gwyneth Sprout, MD  ipratropium-albuterol (DUONEB) 0.5-2.5 (3) MG/3ML SOLN Take 3 mLs by nebulization every 4 (four) hours as needed. 06/21/18 07/21/18  Amin, Loura Halt, MD  oxyCODONE (OXY IR/ROXICODONE) 5 MG immediate release tablet Take 1 tablet (5 mg total) by mouth every 6 (six) hours as needed for up to 15 doses for severe pain. Patient not taking: Reported on 11/13/2021 06/21/18   Dimple Nanas, MD      Allergies    Penicillins    Review of Systems   Review of Systems  Physical Exam Updated Vital Signs BP 106/66    Pulse 86    Temp 97.9 F (36.6 C) (Oral)    Resp (!) 26    Ht 5\' 3"  (1.6 m)    Wt 65.8 kg    LMP 12/12/2011    SpO2 94%    BMI 25.69 kg/m  Physical Exam Vitals and nursing note reviewed.  Constitutional:      General: She is not in acute distress.    Appearance: She is well-developed. She is not diaphoretic.  HENT:     Head: Normocephalic and atraumatic.  Eyes:     Pupils: Pupils are equal, round, and reactive to light.  Cardiovascular:     Rate and Rhythm: Normal rate and regular rhythm.     Heart sounds: No murmur heard.   No friction rub. No gallop.  Pulmonary:     Effort: Pulmonary effort is normal.     Breath sounds: Wheezing present. No rales.     Comments: Diffuse wheezes with prolonged expiratory effort Abdominal:     General: There is no distension.     Palpations: Abdomen is soft.     Tenderness: There is no abdominal tenderness.  Musculoskeletal:  General: No tenderness.     Cervical back: Normal range of motion and neck supple.  Skin:    General: Skin is warm and dry.  Neurological:     Mental Status: She is alert and oriented to person, place, and time.  Psychiatric:        Behavior: Behavior normal.    ED Results / Procedures / Treatments   Labs (all labs ordered are listed, but only abnormal results are displayed) Labs Reviewed  COMPREHENSIVE METABOLIC PANEL - Abnormal; Notable for the following components:      Result Value   Glucose, Bld 138 (*)    Calcium 8.8 (*)    All other components within normal limits  CBC WITH DIFFERENTIAL/PLATELET - Abnormal; Notable for the following components:   RBC 5.39 (*)    Hemoglobin 16.7 (*)    HCT 51.7 (*)    All other components within normal limits  RESP PANEL BY RT-PCR (FLU A&B, COVID) ARPGX2  BRAIN NATRIURETIC PEPTIDE  TROPONIN I (HIGH SENSITIVITY)  TROPONIN I  (HIGH SENSITIVITY)    EKG EKG Interpretation  Date/Time:  Tuesday February 19 2022 15:47:10 EST Ventricular Rate:  93 PR Interval:  152 QRS Duration: 90 QT Interval:  345 QTC Calculation: 430 R Axis:   84 Text Interpretation: Sinus rhythm Right atrial enlargement No significant change since last tracing Confirmed by Melene Plan [phone]) on 02/19/2022 4:02:54 PM  Radiology DG Chest Portable 1 View  Result Date: 02/19/2022 CLINICAL DATA:  Shortness of breath. EXAM: PORTABLE CHEST 1 VIEW COMPARISON:  November 13, 2021. FINDINGS: The heart size and mediastinal contours are within normal limits. Both lungs are clear. The visualized skeletal structures are unremarkable. IMPRESSION: No active disease. Electronically Signed   By: Lupita Raider M.D.   On: 02/19/2022 16:12    Procedures Procedures    Medications Ordered in ED Medications  ibuprofen (ADVIL) tablet 800 mg (has no administration in time range)  albuterol (VENTOLIN HFA) 108 (90 Base) MCG/ACT inhaler 2 puff (has no administration in time range)  aerochamber Z-Stat Plus/medium 1 each (has no administration in time range)  ipratropium-albuterol (DUONEB) 0.5-2.5 (3) MG/3ML nebulizer solution 3 mL (3 mLs Nebulization Given 02/19/22 1648)  magnesium sulfate IVPB 2 g 50 mL (0 g Intravenous Stopped 02/19/22 1727)  methylPREDNISolone sodium succinate (SOLU-MEDROL) 125 mg/2 mL injection 125 mg (125 mg Intravenous Given 02/19/22 1630)    ED Course/ Medical Decision Making/ A&P                           Medical Decision Making Risk Prescription drug management.   56 yo F with a significant past medical history of COPD chronically on 5 L of oxygen at all times with a chief complaint of shortness of breath.  Reportedly she is run out of her home oxygen.  She feels this is similar to her prior COPD exacerbation.  Chest x-ray independently interpreted by me without focal obstructive pneumothorax.  We will treat with 3 DuoNebs back-to-back  Solu-Medrol and magnesium.  Consult placed to social work.  Reassess.  On reassessment the patient tells me that she does not feel any better.  She has an improved work of breathing and has improved aeration on lung sounds.  I discussed with her about coming into the hospital and she became adamant that she was going to go home.  She told me she had other things she had to do. I discussed with her  the concern about not having oxygen at home she told me that she does have some oxygen that she can use.  I encouraged her to return anytime she would like to be reevaluated.  As a leaving the room she told me that she is having some dental pain and that she would like some medicine for.  With her increase sputum and change in sputum will start on antibiotics per the gold guidelines.  We will have her follow-up with her PCP.  CRITICAL CARE Performed by: Rae Roam   Total critical care time: 35 minutes  Critical care time was exclusive of separately billable procedures and treating other patients.  Critical care was necessary to treat or prevent imminent or life-threatening deterioration.  Critical care was time spent personally by me on the following activities: development of treatment plan with patient and/or surrogate as well as nursing, discussions with consultants, evaluation of patient's response to treatment, examination of patient, obtaining history from patient or surrogate, ordering and performing treatments and interventions, ordering and review of laboratory studies, ordering and review of radiographic studies, pulse oximetry and re-evaluation of patient's condition.  6:01 PM:  I have discussed the diagnosis/risks/treatment options with the patient.  Evaluation and diagnostic testing in the emergency department does not suggest an emergent condition requiring admission or immediate intervention beyond what has been performed at this time.  They will follow up with  PCP. We also  discussed returning to the ED immediately if new or worsening sx occur. We discussed the sx which are most concerning (e.g., sudden worsening pain, fever, inability to tolerate by mouth) that necessitate immediate return. Medications administered to the patient during their visit and any new prescriptions provided to the patient are listed below.  Medications given during this visit Medications  ibuprofen (ADVIL) tablet 800 mg (has no administration in time range)  albuterol (VENTOLIN HFA) 108 (90 Base) MCG/ACT inhaler 2 puff (has no administration in time range)  aerochamber Z-Stat Plus/medium 1 each (has no administration in time range)  ipratropium-albuterol (DUONEB) 0.5-2.5 (3) MG/3ML nebulizer solution 3 mL (3 mLs Nebulization Given 02/19/22 1648)  magnesium sulfate IVPB 2 g 50 mL (0 g Intravenous Stopped 02/19/22 1727)  methylPREDNISolone sodium succinate (SOLU-MEDROL) 125 mg/2 mL injection 125 mg (125 mg Intravenous Given 02/19/22 1630)     The patient appears reasonably screen and/or stabilized for discharge and I doubt any other medical condition or other [facility] requiring further screening, evaluation, or treatment in the ED at this time prior to discharge.         Final Clinical Impression(s) / ED Diagnoses Final diagnoses:  COPD exacerbation (HCC)    Rx / DC Orders ED Discharge Orders          Ordered    predniSONE (DELTASONE) 20 MG tablet        02/19/22 1758    azithromycin (ZITHROMAX) 250 MG tablet  Daily        02/19/22 1758              Melene Plan, DO 02/19/22 1801

## 2022-02-19 NOTE — ED Provider Triage Note (Signed)
Emergency Medicine Provider Triage Evaluation Note  Linda Foley , a 56 y.o. female  was evaluated in triage.  Pt complains of shortness of breath. She states that today she ran out of her oxygen.  She states she is currently on 4 L at baseline.  She states she still continues to smoke a pack a day but does take herself off her oxygen before she likes her cigarette. She states she has a productive cough.  She states she has not had any of her breathing treatments in about a year.    Physical Exam  BP (!) 115/98 (BP Location: Right Arm)    Pulse 96    Temp 97.9 F (36.6 C) (Oral)    Resp (!) 23    Ht 5\' 3"  (1.6 m)    Wt 65.8 kg    LMP 12/12/2011    SpO2 92%    BMI 25.69 kg/m  Gen:   Awake, increased work of breathing Resp:  Increased work of breathing, wheezes primarily in the left upper lung field MSK:   Moves extremities without difficulty  Other:  Normal speech.   Medical Decision Making  Medically screening exam initiated at 3:46 PM.  Appropriate orders placed.  Linda Foley was informed that the remainder of the evaluation will be completed by another provider, this initial triage assessment does not replace that evaluation, and the importance of remaining in the ED until their evaluation is complete.  Patient was placed on her baseline 4 L, she continues to be hypoxic in the 80s.  She was bumped up to 5 L nasal cannula with improvement to the low 90s.   Linda Foley, Linda Foley 02/19/22 1549

## 2022-02-19 NOTE — TOC Transition Note (Signed)
Transition of Care [facility]) - CM/SW Discharge Note   Patient Details  Name: Linda Foley MRN: [id number] Date of Birth: October 27, 1966  [facility] CM/SW Contact:  Lavenia Atlas, RN Phone Number: 02/19/2022, 6:37 PM   Clinical Narrative:     Per chart review patient did not wish to be admitted and has left [facility] ED. Patient reported to EDP that she has some home O2 tanks hidden. This RNCM spoke with Zack at [employer] re: the charity oxygen tank. Patient received charity home oxygen June 2019 and [facility] has been servicing this patient since. In April 2022 tanks were switched out. Patient should have a concentrator at home. Due to patient leaving,  new walking sats could not be assessed, EDP is unable to place home O2 orders. [employer]'s leadership is aware and will contact patient to follow up with obtaining O2. Per Zack with [employer] patient maybe eligible for charity switch out of 8 tanks per month.      Patient Goals and CMS Choice  Patient wants to go home      Discharge Placement  home    Discharge Plan and Services   [facility] consult  Home oxygen: [employer] [facility]   Social Determinants of Health (SDOH) Interventions     Readmission Risk Interventions No flowsheet data found.
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
## 2022-02-19 NOTE — Discharge Instructions (Addendum)
Use your inhaler every 4 hours(6 puffs) while awake, return for sudden worsening shortness of breath, or if you need to use your inhaler more often.   I have given you a list of dentists that can see you for your tooth.  As I discussed with you before please return anytime you would like to be reevaluated and at that time they may reconsider you for admission.

## 2022-02-19 NOTE — ED Notes (Signed)
Pt wheeled to waiting room. Pt verbalized understanding of discharge instructions.   

## 2022-02-19 NOTE — ED Notes (Signed)
Pt reports she does not have oxygen in her car but she "might" have a tank at home. Pt reports she has to go home due to having a puppy to take care of.

## 2022-02-20 ENCOUNTER — Ambulatory Visit: Payer: Self-pay | Admitting: *Deleted

## 2022-02-20 NOTE — Telephone Encounter (Signed)
?  Chief Complaint: patient calling for NP vs hospital follow up appointment, Patient states she can not walk to bathroom without feeling faint- advised ED now ?Symptoms: no O2- trouble breathing COPD ?Frequency: patient states she ran out of O2 yesterday and went to ED- she declined to be admitted- she has a dog and did not have anyone to care for him. Patient advised needs to return- she states she will- today. Patient states she is having trouble getting her O2- they will not deliver it to hotel- but she states she has been living in hotel for years ?Pertinent Negatives:   ?Disposition: [x] ED /[] Urgent Care (no appt availability in office) / [] Appointment(In office/virtual)/ []  [facility]/ [] Home Care/ [] Refused Recommended Disposition /[] [facility]/ []  Follow-up with PCP ?Additional Notes: Patient advised call office when discharged for follow up appointment  ?

## 2022-02-20 NOTE — Telephone Encounter (Signed)
Attempt to call patient to schedule appointment.  ?Left message on voicemail to return call.  ? ?

## 2022-02-20 NOTE — Telephone Encounter (Signed)
Reason for Disposition ? [1] MODERATE difficulty breathing (e.g., speaks in phrases, SOB even at rest) AND [2] worse than normal ? ?Answer Assessment - Initial Assessment Questions ?1. MAIN CONCERN OR SYMPTOM: "What's your main concern?" (e.g., low oxygen level, breathing difficulty) "What question do you have?" ?    Low O2 due COPD- patient has run out of O2 ?2.  OXYGEN EQUIPMENT:  Are you having trouble with your oxygen equipment?  (e.g., cannula, mask, tubing, tank, concentrator) ?    Patient is having trouble getting O2 delivered to hotel were she lives ?3. ONSET: "When did the  SOB  start?"  ?    When patient ran out of her O2 ?4. OXYGEN THERAPY:  ?  - "Do you currently use home oxygen?" (e.g., yes, no).  ?  - If Yes, ask: "What is your oxygen source?" (e.g., O2 tank, O2 concentrator).  ?  - If Yes, ask: "How do you get the oxygen?" (e.g., nasal prongs, face mask).  ?  - If Yes, ask: "How much oxygen are you supposed to use?" (e.g., 1-2 L [place]) ?    Patient states she ran out of O2 and was on 5 l at ED ?5. PULSE OXIMETER:  ?  - "Do you have a pulse oximeter (pulse ox)?"  (e.g., yes, no)  ?  - If Yes, ask: "Where do you place the probe?" (e.g., fingertip, ear lobe) ?    no ?6. O2 MONITORING: "What is the oxygen level (pulse ox reading)?" (e.g., 70-100%) ?7: VSS MONITORING: "Do you monitor/measure your oxygen level or vital signs (e.g., yes, no, measurements are automatically sent to provider/call center). Document CURRENT and NORMAL BASELINE values if available.   ?  -  P: "What is your pulse rate per minute?" ?  -  RR: "What is your respiratory rate per minute?" ?    no ?8. BREATHING DIFFICULTY: "Are you having any difficulty breathing?" If Yes, ask: "How bad is it?"  (e.g., none, mild, moderate, severe)  ? - MILD: No SOB at rest, mild SOB with walking, speaks normally in sentences, able to lie down, no retractions, pulse < 100.  ? - MODERATE: SOB at rest, SOB with minimal exertion and prefers to sit, cannot lie  down flat, speaks in phrases, mild retractions, audible wheezing, pulse 100-120.  ? - SEVERE: Very SOB at rest, speaks in single words, struggling to breathe, sitting hunched forward, retractions, pulse > 120  ?    Moderate/severe ?9. OTHER SYMPTOMS: "Do you have any other symptoms?" (e.g., fever, change in sputum) ?    Walking is difficult ?10. SMOKING: "Do you smoke currently?" "Is there anyone that smokes around you?"  (Note: smoking around oxygen is dangerous!) ?      *No Answer* ? ?Protocols used: COPD Oxygen Monitoring and Hypoxia-A-AH ? ?

## 2022-02-21 ENCOUNTER — Encounter (HOSPITAL_COMMUNITY): Payer: Self-pay | Admitting: Emergency Medicine

## 2022-02-21 ENCOUNTER — Inpatient Hospital Stay (HOSPITAL_COMMUNITY)
Admission: EM | Admit: 2022-02-21 | Discharge: 2022-02-23 | DRG: 190 | Disposition: A | Discharge status: Home / Self Care | Payer: Self-pay | Attending: Family Medicine | Admitting: Family Medicine

## 2022-02-21 ENCOUNTER — Emergency Department (HOSPITAL_COMMUNITY): Payer: Self-pay

## 2022-02-21 ENCOUNTER — Other Ambulatory Visit: Payer: Self-pay

## 2022-02-21 DIAGNOSIS — J9621 Acute and chronic respiratory failure with hypoxia: Secondary | ICD-10-CM | POA: Diagnosis present

## 2022-02-21 DIAGNOSIS — J441 Chronic obstructive pulmonary disease with (acute) exacerbation: Principal | ICD-10-CM | POA: Diagnosis present

## 2022-02-21 DIAGNOSIS — Z833 Family history of diabetes mellitus: Secondary | ICD-10-CM

## 2022-02-21 DIAGNOSIS — Z720 Tobacco use: Secondary | ICD-10-CM

## 2022-02-21 DIAGNOSIS — F17210 Nicotine dependence, cigarettes, uncomplicated: Secondary | ICD-10-CM | POA: Diagnosis present

## 2022-02-21 DIAGNOSIS — Z9049 Acquired absence of other specified parts of digestive tract: Secondary | ICD-10-CM

## 2022-02-21 DIAGNOSIS — Z20822 Contact with and (suspected) exposure to covid-19: Secondary | ICD-10-CM | POA: Diagnosis present

## 2022-02-21 DIAGNOSIS — Z880 Allergy status to penicillin: Secondary | ICD-10-CM

## 2022-02-21 LAB — COMPREHENSIVE METABOLIC PANEL
ALT: 23 U/L (ref 0–44)
AST: 29 U/L (ref 15–41)
Albumin: 4.2 g/dL (ref 3.5–5.0)
Alkaline Phosphatase: 51 U/L (ref 38–126)
Anion gap: 9 (ref 5–15)
BUN: 19 mg/dL (ref 6–20)
CO2: 26 mmol/L (ref 22–32)
Calcium: 8.8 mg/dL — ABNORMAL LOW (ref 8.9–10.3)
Chloride: 103 mmol/L (ref 98–111)
Creatinine, Ser: 0.74 mg/dL (ref 0.44–1.00)
GFR, Estimated: >60 mL/min (ref >60)
Glucose, Bld: 126 mg/dL — ABNORMAL HIGH (ref 70–99)
Potassium: 4.3 mmol/L (ref 3.5–5.1)
Sodium: 138 mmol/L (ref 135–145)
Total Bilirubin: 1.1 mg/dL (ref 0.3–1.2)
Total Protein: 8.1 g/dL (ref 6.5–8.1)

## 2022-02-21 LAB — HCG, QUANTITATIVE, PREGNANCY: hCG, Beta Chain, Quant, S: 4 m[IU]/mL (ref <5)

## 2022-02-21 LAB — BLOOD GAS, VENOUS
Acid-Base Excess: 2.4 mmol/L — ABNORMAL HIGH (ref 0.0–2.0)
Bicarbonate: 30.5 mmol/L — ABNORMAL HIGH (ref 20.0–28.0)
O2 Saturation: 77.7 %
Patient temperature: 37
pCO2, Ven: 62 mmHg — ABNORMAL HIGH (ref 44–60)
pH, Ven: 7.3 (ref 7.25–7.43)
pO2, Ven: 46 mmHg — ABNORMAL HIGH (ref 32–45)

## 2022-02-21 LAB — BLOOD GAS, ARTERIAL
Acid-Base Excess: 4.8 mmol/L — ABNORMAL HIGH (ref 0.0–2.0)
Acid-Base Excess: 2.4 mmol/L — ABNORMAL HIGH (ref 0.0–2.0)
Bicarbonate: 30.4 mmol/L — ABNORMAL HIGH (ref 20.0–28.0)
Bicarbonate: 28.4 mmol/L — ABNORMAL HIGH (ref 20.0–28.0)
Delivery systems: POSITIVE
Drawn by: 25788
Expiratory PAP: 6 cmH2O
FIO2: 30 %
Inspiratory PAP: 16 cmH2O
Mode: POSITIVE
O2 Saturation: 95.5 %
O2 Saturation: 97.1 %
Patient temperature: 37
Patient temperature: 37.1
pCO2 arterial: 48 mmHg (ref 32–48)
pCO2 arterial: 48 mmHg (ref 32–48)
pH, Arterial: 7.41 (ref 7.35–7.45)
pH, Arterial: 7.38 (ref 7.35–7.45)
pO2, Arterial: 68 mmHg — ABNORMAL LOW (ref 83–108)
pO2, Arterial: 80 mmHg — ABNORMAL LOW (ref 83–108)

## 2022-02-21 LAB — CBC WITH DIFFERENTIAL/PLATELET
Abs Immature Granulocytes: 0.03 10*3/uL (ref 0.00–0.07)
Basophils Absolute: 0.1 10*3/uL (ref 0.0–0.1)
Basophils Relative: 1 %
Eosinophils Absolute: 0.2 10*3/uL (ref 0.0–0.5)
Eosinophils Relative: 1 %
HCT: 53.5 % — ABNORMAL HIGH (ref 36.0–46.0)
Hemoglobin: 17 g/dL — ABNORMAL HIGH (ref 12.0–15.0)
Immature Granulocytes: 0 %
Lymphocytes Relative: 21 %
Lymphs Abs: 2.5 10*3/uL (ref 0.7–4.0)
MCH: 31.3 pg (ref 26.0–34.0)
MCHC: 31.8 g/dL (ref 30.0–36.0)
MCV: 98.5 fL (ref 80.0–100.0)
Monocytes Absolute: 1.3 10*3/uL — ABNORMAL HIGH (ref 0.1–1.0)
Monocytes Relative: 11 %
Neutro Abs: 7.9 10*3/uL — ABNORMAL HIGH (ref 1.7–7.7)
Neutrophils Relative %: 66 %
Platelets: 283 10*3/uL (ref 150–400)
RBC: 5.43 MIL/uL — ABNORMAL HIGH (ref 3.87–5.11)
RDW: 13.8 % (ref 11.5–15.5)
WBC: 11.9 10*3/uL — ABNORMAL HIGH (ref 4.0–10.5)
nRBC: 0 % (ref 0.0–0.2)

## 2022-02-21 LAB — TROPONIN I (HIGH SENSITIVITY)
Troponin I (High Sensitivity): 4 ng/L (ref <18)
Troponin I (High Sensitivity): 4 ng/L (ref <18)

## 2022-02-21 LAB — LACTIC ACID, PLASMA
Lactic Acid, Venous: 1.8 mmol/L (ref 0.5–1.9)
Lactic Acid, Venous: 1.8 mmol/L (ref 0.5–1.9)

## 2022-02-21 LAB — MRSA NEXT GEN BY PCR, NASAL: MRSA by PCR Next Gen: DETECTED — AB

## 2022-02-21 LAB — RESP PANEL BY RT-PCR (FLU A&B, COVID) ARPGX2
Influenza A by PCR: NEGATIVE
Influenza B by PCR: NEGATIVE
SARS Coronavirus 2 by RT PCR: NEGATIVE

## 2022-02-21 LAB — PROTIME-INR
INR: 0.9 (ref 0.8–1.2)
Prothrombin Time: 12.4 seconds (ref 11.4–15.2)

## 2022-02-21 LAB — AMMONIA: Ammonia: 25 umol/L (ref 9–35)

## 2022-02-21 LAB — D-DIMER, QUANTITATIVE: D-Dimer, Quant: 0.49 ug/mL-FEU (ref 0.00–0.50)

## 2022-02-21 LAB — APTT: aPTT: 23 seconds — ABNORMAL LOW (ref 24–36)

## 2022-02-21 MED ORDER — GUAIFENESIN ER 600 MG PO TB12
600.0000 mg | ORAL_TABLET | Freq: Two times a day (BID) | ORAL | Status: DC
  Administered 2022-02-21 – 2022-02-23 (×4): 600 mg via ORAL
  Filled 2022-02-21 (×4): qty 1

## 2022-02-21 MED ORDER — ALBUTEROL SULFATE (2.5 MG/3ML) 0.083% IN NEBU
10.0000 mg/h | INHALATION_SOLUTION | RESPIRATORY_TRACT | Status: DC
  Administered 2022-02-21: 10 mg/h via RESPIRATORY_TRACT
  Filled 2022-02-21: qty 20
  Filled 2022-02-21: qty 12

## 2022-02-21 MED ORDER — ONDANSETRON HCL 4 MG/2ML IJ SOLN: 4.0000 mg | Freq: Four times a day (QID) | INTRAMUSCULAR | Status: DC | PRN

## 2022-02-21 MED ORDER — ORAL CARE MOUTH RINSE
15.0000 mL | Freq: Two times a day (BID) | OROMUCOSAL | Status: DC
  Administered 2022-02-21 – 2022-02-23 (×3): 15 mL via OROMUCOSAL

## 2022-02-21 MED ORDER — ONDANSETRON HCL 4 MG PO TABS: 4.0000 mg | ORAL_TABLET | Freq: Four times a day (QID) | ORAL | Status: DC | PRN

## 2022-02-21 MED ORDER — CHLORHEXIDINE GLUCONATE 0.12 % MT SOLN
15.0000 mL | Freq: Two times a day (BID) | OROMUCOSAL | Status: DC
  Administered 2022-02-21 – 2022-02-23 (×4): 15 mL via OROMUCOSAL
  Filled 2022-02-21 (×4): qty 15

## 2022-02-21 MED ORDER — LACTATED RINGERS IV BOLUS (SEPSIS)
500.0000 mL | Freq: Once | INTRAVENOUS | Status: AC
  Administered 2022-02-21: 500 mL via INTRAVENOUS

## 2022-02-21 MED ORDER — PREDNISONE 20 MG PO TABS
40.0000 mg | ORAL_TABLET | Freq: Every day | ORAL | Status: DC
  Administered 2022-02-22 – 2022-02-23 (×2): 40 mg via ORAL
  Filled 2022-02-21 (×2): qty 2

## 2022-02-21 MED ORDER — CHLORHEXIDINE GLUCONATE CLOTH 2 % EX PADS
6.0000 | MEDICATED_PAD | Freq: Every day | CUTANEOUS | Status: DC
  Administered 2022-02-21 – 2022-02-23 (×3): 6 via TOPICAL

## 2022-02-21 MED ORDER — ENOXAPARIN SODIUM 40 MG/0.4ML IJ SOSY
40.0000 mg | PREFILLED_SYRINGE | INTRAMUSCULAR | Status: DC
  Administered 2022-02-21 – 2022-02-23 (×3): 40 mg via SUBCUTANEOUS
  Filled 2022-02-21 (×3): qty 0.4

## 2022-02-21 MED ORDER — IBUPROFEN 200 MG PO TABS
400.0000 mg | ORAL_TABLET | Freq: Once | ORAL | Status: AC
  Administered 2022-02-21: 400 mg via ORAL
  Filled 2022-02-21: qty 2

## 2022-02-21 MED ORDER — ACETAMINOPHEN 325 MG PO TABS
650.0000 mg | ORAL_TABLET | Freq: Four times a day (QID) | ORAL | Status: DC | PRN
  Administered 2022-02-21 – 2022-02-23 (×4): 650 mg via ORAL
  Filled 2022-02-21 (×5): qty 2

## 2022-02-21 MED ORDER — IPRATROPIUM BROMIDE 0.02 % IN SOLN
0.5000 mg | Freq: Once | RESPIRATORY_TRACT | Status: AC
  Administered 2022-02-21: 0.5 mg via RESPIRATORY_TRACT
  Filled 2022-02-21: qty 2.5

## 2022-02-21 MED ORDER — SODIUM CHLORIDE 0.9 % IV SOLN
500.0000 mg | INTRAVENOUS | Status: AC
  Administered 2022-02-21: 500 mg via INTRAVENOUS
  Filled 2022-02-21: qty 5

## 2022-02-21 MED ORDER — METHYLPREDNISOLONE SODIUM SUCC 125 MG IJ SOLR
60.0000 mg | Freq: Two times a day (BID) | INTRAMUSCULAR | Status: AC
  Administered 2022-02-21 – 2022-02-22 (×2): 60 mg via INTRAVENOUS
  Filled 2022-02-21 (×2): qty 2

## 2022-02-21 MED ORDER — NICOTINE 21 MG/24HR TD PT24
21.0000 mg | MEDICATED_PATCH | Freq: Once | TRANSDERMAL | Status: AC
Start: 2022-02-21 — End: 2022-02-22
  Administered 2022-02-21: 21 mg via TRANSDERMAL
  Filled 2022-02-21: qty 1

## 2022-02-21 MED ORDER — AZITHROMYCIN 250 MG PO TABS
500.0000 mg | ORAL_TABLET | Freq: Every day | ORAL | Status: DC
  Administered 2022-02-22 – 2022-02-23 (×2): 500 mg via ORAL
  Filled 2022-02-21 (×2): qty 2

## 2022-02-21 MED ORDER — ACETAMINOPHEN 650 MG RE SUPP: 650.0000 mg | Freq: Four times a day (QID) | RECTAL | Status: DC | PRN

## 2022-02-21 MED ORDER — LORAZEPAM 2 MG/ML IJ SOLN
0.5000 mg | Freq: Once | INTRAMUSCULAR | Status: AC
Start: 2022-02-21 — End: 2022-02-21
  Administered 2022-02-21: 0.5 mg via INTRAVENOUS
  Filled 2022-02-21: qty 1

## 2022-02-21 MED ORDER — LORAZEPAM 2 MG/ML IJ SOLN: 1.0000 mg | Freq: Three times a day (TID) | INTRAMUSCULAR | Status: DC | PRN

## 2022-02-21 MED ORDER — IPRATROPIUM-ALBUTEROL 0.5-2.5 (3) MG/3ML IN SOLN
3.0000 mL | Freq: Four times a day (QID) | RESPIRATORY_TRACT | Status: DC
  Administered 2022-02-21 – 2022-02-22 (×4): 3 mL via RESPIRATORY_TRACT
  Filled 2022-02-21 (×4): qty 3

## 2022-02-21 MED ORDER — LACTATED RINGERS IV SOLN: Freq: Once | INTRAVENOUS | Status: AC

## 2022-02-21 MED ORDER — ALBUTEROL SULFATE (2.5 MG/3ML) 0.083% IN NEBU
10.0000 mg/h | INHALATION_SOLUTION | RESPIRATORY_TRACT | Status: DC
  Administered 2022-02-21: 10 mg/h via RESPIRATORY_TRACT
  Filled 2022-02-21: qty 12

## 2022-02-21 MED ORDER — CHLORHEXIDINE GLUCONATE 0.12 % MT SOLN
OROMUCOSAL | Status: AC
  Administered 2022-02-21: 15 mL via OROMUCOSAL
  Filled 2022-02-21: qty 15

## 2022-02-21 NOTE — ED Notes (Signed)
Pt out is at 77 on room air, walking  ?

## 2022-02-21 NOTE — ED Triage Notes (Signed)
BIB EMS from home for respiratory distress with back and cardiac pain, here yesterday left AMA  ?

## 2022-02-21 NOTE — ED Provider Notes (Signed)
?[facility]-EMERGENCY DEPT ?Provider Note ? ? ?CSN: [id number] ?Arrival date & time: 02/21/22  0126 ? ?  ? ?History ? ?Chief Complaint  ?Patient presents with  ? Respiratory Distress  ? ? ?Linda Foley is a 56 y.o. female. ? ?The history is provided by the patient, the EMS personnel and medical records.  ?Linda Foley is a 56 y.o. female who presents to the Emergency Department complaining of sob.  She presents to the ED for evaluation 3-4 days of sob.  Has associated chest pain.  Has pain in bilateral legs.  Has associated cough productive of green and gray sputum.    Has occasional nausea.  No leg swelling.   ? ?No fever.  ? ?Has a hx/o COPD.  Supposed to be on 3-5L [place] but cannot get it due to staying in a hotel.  She has been out of oxygen since yesterday.  No additional medical problems.    ?  ? ?Home Medications ?Prior to Admission medications   ?Medication Sig Start Date End Date Taking? Authorizing Provider  ?albuterol (PROVENTIL HFA;VENTOLIN HFA) 108 (90 Base) MCG/ACT inhaler Inhale 2 puffs into the lungs every 6 (six) hours as needed for wheezing or shortness of breath. 06/21/18 02/22/23 Yes Amin, Loura Halt, MD  ?azithromycin (ZITHROMAX) 250 MG tablet Take 1 tablet (250 mg total) by mouth daily. Take first 2 tablets together, then 1 every day until finished. ?Patient not taking: Reported on 02/21/2022 02/19/22   Melene Plan, DO  ?   ? ?Allergies    ?Penicillins   ? ?Review of Systems   ?Review of Systems  ?All other systems reviewed and are negative. ? ?Physical Exam ?Updated Vital Signs ?BP 111/73   Pulse 100   Temp 98.6 ?F (37 ?C) (Axillary)   Resp (!) 21   Ht 5\' 3"  (1.6 m)   Wt 65.8 kg   LMP 12/12/2011   SpO2 92%   BMI 25.70 kg/m?  ?Physical Exam ?Vitals and nursing note reviewed.  ?Constitutional:   ?   Appearance: She is well-developed.  ?HENT:  ?   Head: Normocephalic and atraumatic.  ?Cardiovascular:  ?   Rate and Rhythm: Normal rate and regular rhythm.  ?   Heart  sounds: No murmur heard. ?Pulmonary:  ?   Effort: Respiratory distress present.  ?   Comments: Absent air movement bilaterally ?Abdominal:  ?   Palpations: Abdomen is soft.  ?   Tenderness: There is no abdominal tenderness. There is no guarding or rebound.  ?Musculoskeletal:     ?   General: No swelling or tenderness.  ?Skin: ?   General: Skin is warm and dry.  ?Neurological:  ?   Mental Status: She is alert and oriented to person, place, and time.  ?Psychiatric:     ?   Behavior: Behavior normal.  ? ? ?ED Results / Procedures / Treatments   ?Labs ?(all labs ordered are listed, but only abnormal results are displayed) ?Labs Reviewed  ?COMPREHENSIVE METABOLIC PANEL - Abnormal; Notable for the following components:  ?    Result Value  ? Glucose, Bld 126 (*)   ? Calcium 8.8 (*)   ? All other components within normal limits  ?CBC WITH DIFFERENTIAL/PLATELET - Abnormal; Notable for the following components:  ? WBC 11.9 (*)   ? RBC 5.43 (*)   ? Hemoglobin 17.0 (*)   ? HCT 53.5 (*)   ? Neutro Abs 7.9 (*)   ? Monocytes Absolute 1.3 (*)   ?  All other components within normal limits  ?APTT - Abnormal; Notable for the following components:  ? aPTT 23 (*)   ? All other components within normal limits  ?BLOOD GAS, VENOUS - Abnormal; Notable for the following components:  ? pCO2, Ven 62 (*)   ? pO2, Ven 46 (*)   ? Bicarbonate 30.5 (*)   ? Acid-Base Excess 2.4 (*)   ? All other components within normal limits  ?RESP PANEL BY RT-PCR (FLU A&B, COVID) [id number]  ?CULTURE, BLOOD (ROUTINE X 2)  ?CULTURE, BLOOD (ROUTINE X 2)  ?URINE CULTURE  ?LACTIC ACID, PLASMA  ?LACTIC ACID, PLASMA  ?PROTIME-INR  ?HCG, QUANTITATIVE, PREGNANCY  ?URINALYSIS, ROUTINE W REFLEX MICROSCOPIC  ?TROPONIN I (HIGH SENSITIVITY)  ?TROPONIN I (HIGH SENSITIVITY)  ? ? ?EKG ?EKG Interpretation ? ?Date/Time:  Thursday February 21 2022 01:37:20 EST ?Ventricular Rate:  105 ?PR Interval:  155 ?QRS Duration: 86 ?QT Interval:  311 ?QTC Calculation: 409 ?R Axis:   90 ?Text  Interpretation: Sinus tachycardia Biatrial enlargement Anterior infarct, old Confirmed by Tilden Fossa [phone]) on 02/21/2022 1:39:28 AM ? ?Radiology ?DG Chest Port 1 View ? ?Result Date: 02/21/2022 ?CLINICAL DATA:  Questionable sepsis.  Shortness of breath EXAM: PORTABLE CHEST 1 VIEW COMPARISON:  02/19/2022 FINDINGS: Hyperinflation. Heart and mediastinal contours are within normal limits. No focal opacities or effusions. No acute bony abnormality. IMPRESSION: Hyperinflation.  No active cardiopulmonary disease. Electronically Signed   By: Charlett Nose M.D.   On: 02/21/2022 01:59  ? ?DG Chest Portable 1 View ? ?Result Date: 02/19/2022 ?CLINICAL DATA:  Shortness of breath. EXAM: PORTABLE CHEST 1 VIEW COMPARISON:  November 13, 2021. FINDINGS: The heart size and mediastinal contours are within normal limits. Both lungs are clear. The visualized skeletal structures are unremarkable. IMPRESSION: No active disease. Electronically Signed   By: Lupita Raider M.D.   On: 02/19/2022 16:12   ? ?Procedures ?Procedures  ? ?CRITICAL CARE ?Performed by: Tilden Fossa ? ? ?Total critical care time: 45 minutes ? ?Critical care time was exclusive of separately billable procedures and treating other patients. ? ?Critical care was necessary to treat or prevent imminent or life-threatening deterioration. ? ?Critical care was time spent personally by me on the following activities: development of treatment plan with patient and/or surrogate as well as nursing, discussions with consultants, evaluation of patient's response to treatment, examination of patient, obtaining history from patient or surrogate, ordering and performing treatments and interventions, ordering and review of laboratory studies, ordering and review of radiographic studies, pulse oximetry and re-evaluation of patient's condition. ? ?Medications Ordered in ED ?Medications  ?albuterol (PROVENTIL) (2.5 MG/3ML) 0.083% nebulizer solution (10 mg/hr Nebulization New Bag/Given  02/21/22 0335)  ?nicotine (NICODERM CQ - dosed in mg/24 hours) patch 21 mg (has no administration in time range)  ?lactated ringers bolus 500 mL (0 mLs Intravenous Stopped 02/21/22 0226)  ?ipratropium (ATROVENT) nebulizer solution 0.5 mg (0.5 mg Nebulization Given 02/21/22 0157)  ? ? ?ED Course/ Medical Decision Making/ A&P ?  ?                        ?Medical Decision Making ?Amount and/or Complexity of Data Reviewed ?Labs: ordered. ?Radiology: ordered. ?ECG/medicine tests: ordered. ? ?Risk ?OTC drugs. ?Prescription drug management. ?Decision regarding hospitalization. ? ? ?Patient with history of COPD on home oxygen here for evaluation of shortness of breath.  Patient in respiratory distress on ED presentation with increased work of breathing, decreased air movement, tripoding.  She was  treated with continuous nebs, BiPAP for respiratory support.  She received Solu-Medrol prior to ED arrival by EMS.  After completing her treatment she was able to be taken off of BiPAP, did have recurrent episode of respiratory distress requiring a second treatment with albuterol.  On reassessment after additional treatment patient is feeling improved, able to speak in full sentences.  She continues to have decreased air movement bilaterally but her respiratory distress has resolved.  Chest x-ray without acute infiltrate, personally reviewed.  Plan to admit to hospital service for ongoing treatment.  Patient is in agreement with treatment plan. ? ? ? ? ? ? ? ?Final Clinical Impression(s) / ED Diagnoses ?Final diagnoses:  ?COPD exacerbation (HCC)  ? ? ?Rx / DC Orders ?ED Discharge Orders   ? ? None  ? ?  ? ? ?  ?Tilden Fossa, MD ?02/21/22 [phone] ? ?

## 2022-02-21 NOTE — ED Notes (Signed)
Attempted IV x 2 no success but got a set of blood cultures on the right forearm. ?

## 2022-02-21 NOTE — Progress Notes (Addendum)
Removed from BiPAP and placed on 4 LPM nasal cannula. Titrated 02 to 3 LPM with Sp02 99% (2-4 lpm dep). PT states she is breathing fine and at this time and she is able to follow commands. RN aware. ?

## 2022-02-21 NOTE — ED Notes (Signed)
Hospitalist notified pt pulling bi-pap off of face and more lethargic however arousable to pain. RT at the bedside at this time. Pt's level of care changed back to Stepdown per hosptialist.  ?

## 2022-02-21 NOTE — ED Notes (Signed)
Pt requesting anxiety medicine- RN aware ?

## 2022-02-21 NOTE — ED Notes (Signed)
Patient called out stating she felt like she could not breathe well. Tech notified this nurse that monitor was not picking up oxygen sat well after multiple attempts. This nurse placed patient back on bipap and notified respiratory for evaluation  ?

## 2022-02-21 NOTE — Progress Notes (Signed)
The nurse called and said she had placed the Pt back on BIPAP. RT went to check to see if the BIPAP was placed correctly. The Pt was very drowsy and RT was told she had been given ativan. The Pt wasn't able to hold her arms up and RT lowered the [facility] to try to keep her head back instead of forward. RT was leaving the room and the Pt asked for pain medicine and I explained to her that she was to drowsy to get pain medicine, RT notified the nurse. RT will continue to monitor. ?

## 2022-02-21 NOTE — Progress Notes (Signed)
Notified Lab that ABG being sent for analysis. 

## 2022-02-21 NOTE — ED Notes (Signed)
Pt states she wants to go home, she says she's feeling better.  MD and nurse is aware  ?

## 2022-02-21 NOTE — H&P (Addendum)
?History and Physical  ? ? ?Patient: Linda Foley BWG:[id number] DOB: March 24, 1966 ?DOA: 02/21/2022 ?DOS: the patient was seen and examined on 02/21/2022 ?PCP: Pcp, No  ?Patient coming from: Home ? ?Chief Complaint:  ?Chief Complaint  ?Patient presents with  ? Respiratory Distress  ? ? ?HPI: Linda Foley is a 56 y.o. female with medical history significant of COPD, tobacco abuse, polysubstance abuse. Presenting with respiratory distress. She reports 3 days of wheeziness and productive cough. She tried her inhalers, but they didn't help. She didn't try any other medications. She hasn't had any fevers or sick contacts. When her symptoms didn't improve last night, she decided to come to the ED for help. She denies any other aggravating or alleviating factors.   ? ?Review of Systems: As mentioned in the history of present illness. All other systems reviewed and are negative. ?Past Medical History:  ?Diagnosis Date  ? Anxiety   ? COPD (chronic obstructive pulmonary disease) (HCC)   ? Polysubstance abuse (HCC)   ? ?Past Surgical History:  ?Procedure Laterality Date  ? CESAREAN SECTION    ? CHOLECYSTECTOMY    ? TUBAL LIGATION    ? ?Social History:  reports that she has been smoking cigarettes. She has a 15.00 pack-year smoking history. She has never used smokeless tobacco. She reports current drug use. Drug: Marijuana. She reports that she does not drink alcohol. ? ?Allergies  ?Allergen Reactions  ? Penicillins Nausea And Vomiting and Other (See Comments)  ?  Has patient had a PCN reaction causing immediate rash, facial/tongue/throat swelling, SOB or lightheadedness with hypotension: no ?Has patient had a PCN reaction causing severe rash involving mucus membranes or skin necrosis: no ?Has patient had a PCN reaction that required hospitalization no ?Has patient had a PCN reaction occurring within the last 10 years: unknown ?If all of the above answers are "NO", then may proceed with Cephalosporin use. ?  ? ? ?Family  History  ?Problem Relation Age of Onset  ? Diabetes Mother   ? ? ?Prior to Admission medications   ?Medication Sig Start Date End Date Taking? Authorizing Provider  ?albuterol (PROVENTIL HFA;VENTOLIN HFA) 108 (90 Base) MCG/ACT inhaler Inhale 2 puffs into the lungs every 6 (six) hours as needed for wheezing or shortness of breath. 06/21/18 02/22/23 Yes Amin, Loura Halt, MD  ?azithromycin (ZITHROMAX) 250 MG tablet Take 1 tablet (250 mg total) by mouth daily. Take first 2 tablets together, then 1 every day until finished. ?Patient not taking: Reported on 02/21/2022 02/19/22   Melene Plan, DO  ? ? ?Physical Exam: ?Vitals:  ? 02/21/22 0530 02/21/22 0600 02/21/22 0630 02/21/22 0713  ?BP: 111/73 117/71 117/70 122/75  ?Pulse:  89 96 99  ?Resp: (!) 21 (!) 23 (!) 22 18  ?Temp:    98.7 ?F (37.1 ?C)  ?TempSrc:    Oral  ?SpO2:  100% 99% (!) 80%  ?Weight:      ?Height:      ? ?General: 56 y.o. female resting in bed in NAD ?Eyes: PERRL, normal sclera ?ENMT: Nares patent w/o discharge, orophaynx clear, dentition poor, ears w/o discharge/lesions/ulcers ?Neck: Supple, trachea midline ?Cardiovascular: RRR, +S1, S2, no m/g/r, equal pulses throughout ?Respiratory: decreased at bases, no w/r/r, slightly increased WOB on 5L ?GI: BS+, NDNT, no masses noted, no organomegaly noted ?MSK: No e/c/c ?Neuro: A&O x 3, no focal deficits ?Psyc: Appropriate interaction and affect, calm/cooperative ? ? ?Data Reviewed: ? ?WBC 11.9 ? ?CXR: Hyperinflation.  No active cardiopulmonary disease. ? ?Assessment  and Plan: ?No notes have been filed under this hospital service. ?Service: Hospitalist ?COPD exacerbation ?Acute on chronic hypoxic respiratory failure (baseline 5L O2 use) ?    - admit to inpt, progressive ?    - BiPap has been weaned to 5L [place]; continue to wean as able; says she is supposed to be on 5L [place] at home, but not able to get it cause HH doesn't deliver to hotels; will ask TOC to review ?    - nebs, azithro, steroids ? ?UPDATE: placed back on BiPAP,  bed orders for SDU. Check ABG, d-dimer. ? ?Tobacco abuse ?    - counsel against further use ?    - nicotine patch ? ?Advance Care Planning:   Code Status: FULL ? ?Consults: None ? ?Family Communication: None at bedside ? ?Severity of Illness: ?The appropriate patient status for this patient is INPATIENT. Inpatient status is judged to be reasonable and necessary in order to provide the required intensity of service to ensure the patient's safety. The patient's presenting symptoms, physical exam findings, and initial radiographic and laboratory data in the context of their chronic comorbidities is felt to place them at high risk for further clinical deterioration. Furthermore, it is not anticipated that the patient will be medically stable for discharge from the hospital within 2 midnights of admission.  ? ?* I certify that at the point of admission it is my clinical judgment that the patient will require inpatient hospital care spanning beyond 2 midnights from the point of admission due to high intensity of service, high risk for further deterioration and high frequency of surveillance required.* ? ?Author: ?Teddy Spike, DO ?02/21/2022 7:22 AM ? ?For on call review [email or web address].  ?

## 2022-02-21 NOTE — ED Notes (Signed)
Patient called out and stated she couldn't breathe.  Lung sounds clear but diminished.  MD Ralene Bathe at bedside and respiratory called.  ?

## 2022-02-21 NOTE — ED Notes (Signed)
Bed placement notified of pt's order changed to Stepdown. Pt pended back to stepdown at this time. Stepdown Charge, and receiving nurse notified and aware.  ?

## 2022-02-22 LAB — COMPREHENSIVE METABOLIC PANEL
ALT: 21 U/L (ref 0–44)
AST: 23 U/L (ref 15–41)
Albumin: 3.7 g/dL (ref 3.5–5.0)
Alkaline Phosphatase: 44 U/L (ref 38–126)
Anion gap: 10 (ref 5–15)
BUN: 20 mg/dL (ref 6–20)
CO2: 28 mmol/L (ref 22–32)
Calcium: 8.9 mg/dL (ref 8.9–10.3)
Chloride: 99 mmol/L (ref 98–111)
Creatinine, Ser: 0.55 mg/dL (ref 0.44–1.00)
GFR, Estimated: >60 mL/min (ref >60)
Glucose, Bld: 72 mg/dL (ref 70–99)
Potassium: 4.3 mmol/L (ref 3.5–5.1)
Sodium: 137 mmol/L (ref 135–145)
Total Bilirubin: 0.4 mg/dL (ref 0.3–1.2)
Total Protein: 7.3 g/dL (ref 6.5–8.1)

## 2022-02-22 LAB — CBC
HCT: 49.4 % — ABNORMAL HIGH (ref 36.0–46.0)
Hemoglobin: 15.5 g/dL — ABNORMAL HIGH (ref 12.0–15.0)
MCH: 31.3 pg (ref 26.0–34.0)
MCHC: 31.4 g/dL (ref 30.0–36.0)
MCV: 99.6 fL (ref 80.0–100.0)
Platelets: 210 10*3/uL (ref 150–400)
RBC: 4.96 MIL/uL (ref 3.87–5.11)
RDW: 14 % (ref 11.5–15.5)
WBC: 12.5 10*3/uL — ABNORMAL HIGH (ref 4.0–10.5)
nRBC: 0 % (ref 0.0–0.2)

## 2022-02-22 LAB — HIV ANTIBODY (ROUTINE TESTING W REFLEX): HIV Screen 4th Generation wRfx: NONREACTIVE

## 2022-02-22 MED ORDER — LORAZEPAM 2 MG/ML IJ SOLN
0.5000 mg | Freq: Once | INTRAMUSCULAR | Status: AC
  Administered 2022-02-22: 0.5 mg via INTRAVENOUS
  Filled 2022-02-22: qty 1

## 2022-02-22 MED ORDER — IPRATROPIUM-ALBUTEROL 0.5-2.5 (3) MG/3ML IN SOLN
3.0000 mL | Freq: Two times a day (BID) | RESPIRATORY_TRACT | Status: DC
  Filled 2022-02-22: qty 3

## 2022-02-22 MED ORDER — NICOTINE 21 MG/24HR TD PT24
21.0000 mg | MEDICATED_PATCH | Freq: Every day | TRANSDERMAL | Status: DC
Start: 2022-02-22 — End: 2022-02-23
  Administered 2022-02-22 – 2022-02-23 (×2): 21 mg via TRANSDERMAL
  Filled 2022-02-22 (×2): qty 1

## 2022-02-22 MED ORDER — TRAZODONE HCL 50 MG PO TABS
50.0000 mg | ORAL_TABLET | Freq: Every evening | ORAL | Status: DC | PRN
  Administered 2022-02-22: 50 mg via ORAL
  Filled 2022-02-22: qty 1

## 2022-02-22 MED ORDER — IPRATROPIUM-ALBUTEROL 0.5-2.5 (3) MG/3ML IN SOLN
3.0000 mL | Freq: Two times a day (BID) | RESPIRATORY_TRACT | Status: DC
  Administered 2022-02-22 – 2022-02-23 (×2): 3 mL via RESPIRATORY_TRACT
  Filled 2022-02-22: qty 3

## 2022-02-22 NOTE — Progress Notes (Signed)
Attempted to ambulate pt in hallway but did not make it outside the room... O2 sats dropped to low 80s on room air and pt went into a coughing spell... pt started c/o saying she was not able to breathe and started to have chest pains after the strong episode of cough... notifed MD.  ?

## 2022-02-22 NOTE — Plan of Care (Signed)
?  Problem: Education: ?Goal: Knowledge of disease or condition will improve ?Outcome: Not Progressing ?  ?Problem: Activity: ?Goal: Ability to tolerate increased activity will improve ?Outcome: Not Progressing ?  ?Problem: Respiratory: ?Goal: Levels of oxygenation will improve ?Outcome: Not Progressing ?  ?
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
## 2022-02-22 NOTE — Progress Notes (Signed)
Pt keeps asking when she's going home... Sts she might leave tonight because she wants to go see her dogs ? ?Also, visitor dropped pt $280... notified charge since pt sts she had $400 missing since being admitted to ICU... Adv her we can keep it w/security but she refused b/c she may leave tonight.  ?

## 2022-02-22 NOTE — Telephone Encounter (Signed)
Left message on voicemail to return call.  ?Attempt to schedule an appt. At [facility].  ?

## 2022-02-22 NOTE — TOC Initial Note (Signed)
Transition of Care (TOC) - Initial/Assessment Note  ? ? ?Patient Details  ?Name: Linda Foley ?MRN: [id number] ?Date of Birth: 08-11-1966 ? ?Transition of Care (TOC) CM/SW Contact:    ?Ida Rogue, LCSW ?Phone Number: ?02/22/2022, 8:52 AM ? ?Clinical Narrative:   Chart review reveals patient left AMA from ED in February before we were able to get SAT note.  [employer] has been involved with DME home O2 in past.  According to [facility] with [employer], we should treat this as new referral.  Will need SAT note and orders for home O2 within 48 hours of d/c. TOC will continue to follow during the course of hospitalization. ?              ? ? ?Expected Discharge Plan: Home/Self Care ?Barriers to Discharge: No Barriers Identified ? ? ?Patient Goals and CMS Choice ?  ?  ?  ? ?Expected Discharge Plan and Services ?Expected Discharge Plan: Home/Self Care ?  ?  ?  ?  ?                ?  ?  ?  ?  ?  ?  ?  ?  ?  ?  ? ?Prior Living Arrangements/Services ?  ?  ?  ?       ?  ?  ?  ?  ? ?Activities of Daily Living ?  ?  ? ?Permission Sought/Granted ?  ?  ?   ?   ?   ?   ? ?Emotional Assessment ?  ?  ?  ?  ?  ?  ? ?Admission diagnosis:  COPD exacerbation (HCC) [J44.1] ?Patient Active Problem List  ? Diagnosis Date Noted  ? COPD exacerbation (HCC) 02/21/2022  ? Tobacco abuse 02/21/2022  ? COPD with acute exacerbation (HCC) 06/20/2018  ? Polysubstance abuse (HCC) 06/20/2018  ? ?PCP:  Pcp, No ?Pharmacy:   ?[id number] - [place], [place] - [address] ?[phone] ?[place] [address] ?Phone: [phone] Fax: [phone] ? ? ? ? ?Social Determinants of Health (SDOH) Interventions ?  ? ?Readmission Risk Interventions ?No flowsheet data found. ? ? ?

## 2022-02-22 NOTE — Progress Notes (Signed)
[facility] ? ?PROGRESS NOTE ? ?Linda Foley NUU:[id number] DOB: September 23, 1966 DOA: 02/21/2022 ?PCP: Pcp, No ? ? ?Brief HPI:   ?56 year old female with medical history of COPD, polysubstance abuse, tobacco abuse presented with respiratory distress.  Patient reported 3-day history of wheezing and productive cough.  Patient came to ED and was found to be in COPD exacerbation ? ? ? ?Subjective  ? ?This morning patient feels better.  Shortness of breath has improved.  She is back on 3 L of oxygen via nasal cannula which is her baseline. ? ? Assessment/Plan:  ? ? ? ?Acute on chronic hypoxemic respiratory failure ?-Patient was started on BiPAP which has been weaned off ?-She is back on 3 L/min of oxygen via nasal cannula which is her baseline ?-We will transfer her out of ICU ? ?COPD exacerbation ?-Patient has improved with DuoNeb, prednisone, Zithromax, guaifenesin ?-Patient will moved out of ICU ?-Likely home in a.m. ? ? ? ?Medications ? ?  ? azithromycin  500 mg Oral Daily  ? chlorhexidine  15 mL Mouth Rinse BID  ? Chlorhexidine Gluconate Cloth  6 each Topical Daily  ? enoxaparin (LOVENOX) injection  40 mg Subcutaneous Q24H  ? guaiFENesin  600 mg Oral BID  ? ipratropium-albuterol  3 mL Nebulization Q6H  ? mouth rinse  15 mL Mouth Rinse q12n4p  ? predniSONE  40 mg Oral Q breakfast  ? ? ? Data Reviewed:  ? ?CBG: ? ?No results for input(s): GLUCAP in the last 168 hours. ? ?SpO2: 94 % ?O2 Flow Rate (L/min): 3 L/min ?FiO2 (%): 28 % (w/ 8 lpm neb)  ? ? ?Vitals:  ? 02/22/22 0900 02/22/22 1000 02/22/22 1100 02/22/22 1200  ?BP: 120/61 (!) 100/50 125/76 133/79  ?Pulse: (!) 101 (!) 103 95 95  ?Resp: (!) 24  16 20   ?Temp:    98.7 ?F (37.1 ?C)  ?TempSrc:    Oral  ?SpO2: 96% 94% 91% 94%  ?Weight:      ?Height:      ? ? ? ? ?Data Reviewed: ? ?Basic Metabolic Panel: ?Recent Labs  ?Lab 02/19/22 ?1546 02/21/22 ?0147 02/22/22 ?0303  ?NA 138 138 137  ?K 3.8 4.3 4.3  ?CL 104 103 99  ?CO2 27 26 28   ?GLUCOSE 138* 126* 72  ?BUN 13 19 20    ?CREATININE 0.58 0.74 0.55  ?CALCIUM 8.8* 8.8* 8.9  ? ? ?CBC: ?Recent Labs  ?Lab 02/19/22 ?1546 02/21/22 ?0147 02/22/22 ?0303  ?WBC 7.0 11.9* 12.5*  ?NEUTROABS 4.4 7.9*  --   ?HGB 16.7* 17.0* 15.5*  ?HCT 51.7* 53.5* 49.4*  ?MCV 95.9 98.5 99.6  ?PLT 243 283 210  ? ? ?LFT ?Recent Labs  ?Lab 02/19/22 ?1546 02/21/22 ?0147 02/22/22 ?0303  ?AST 24 29 23   ?ALT 23 23 21   ?ALKPHOS 49 51 44  ?BILITOT 0.6 1.1 0.4  ?PROT 7.3 8.1 7.3  ?ALBUMIN 3.6 4.2 3.7  ? ?  ?Antibiotics: ?Anti-infectives (From admission, onward)  ? ? Start     Dose/Rate Route Frequency Ordered Stop  ? 02/22/22 1200  azithromycin (ZITHROMAX) tablet 500 mg       ?See Hyperspace for full Linked Orders Report.  ? 500 mg Oral Daily 02/21/22 1046 02/26/22 0959  ? 02/21/22 1200  azithromycin (ZITHROMAX) 500 mg in sodium chloride 0.9 % 250 mL IVPB       ?See Hyperspace for full Linked Orders Report.  ? 500 mg ?250 mL/hr over 60 Minutes Intravenous Every 24 hours 02/21/22 1046 02/21/22 1432  ? ?  ? ? ? ?  DVT prophylaxis: Lovenox ? ?Code Status: Full code ? ?Family Communication: No family at bedside ? ? ?CONSULTS  ? ? ?Objective  ? ? ?Physical Examination: ? ? ?General-appears in no acute distress ?Heart-S1-S2, regular, no murmur auscultated ?Lungs-prolonged expiratory phase, decreased breath sounds at lung bases ?Abdomen-soft, nontender, no organomegaly ?Extremities-no edema in the lower extremities ?Neuro-alert, oriented x3, no focal deficit noted ? ?Status is: Inpatient: COPD exacerbation ? ? ? ?  ? ?General okay thank you make a cha-cha ?Meredeth Ide ?  ?[facility] ?If 7PM-7AM, please contact night-coverage at [email or web address], ?Office  [phone] ? ? ?02/22/2022, 2:06 PM  LOS: 1 day  ? ? ? ? ? ? ? ? ? ? ?  ?

## 2022-02-22 NOTE — Progress Notes (Signed)
No Duoneb in ICU at this time. Scheduled neb will be provided once Pharmacy has delivered and RT is available. RN aware. ?

## 2022-02-23 DIAGNOSIS — Z720 Tobacco use: Secondary | ICD-10-CM

## 2022-02-23 MED ORDER — GUAIFENESIN-DM 100-10 MG/5ML PO SYRP
5.0000 mL | ORAL_SOLUTION | ORAL | Status: DC | PRN
  Administered 2022-02-23: 5 mL via ORAL
  Filled 2022-02-23: qty 10

## 2022-02-23 MED ORDER — ALUM & MAG HYDROXIDE-SIMETH 200-200-20 MG/5ML PO SUSP
15.0000 mL | Freq: Four times a day (QID) | ORAL | Status: DC | PRN
  Administered 2022-02-23: 15 mL via ORAL
  Filled 2022-02-23: qty 30

## 2022-02-23 MED ORDER — PREDNISONE 10 MG PO TABS: ORAL_TABLET | ORAL | 0 refills | Status: DC

## 2022-02-23 MED ORDER — GUAIFENESIN ER 600 MG PO TB12: 600.0000 mg | ORAL_TABLET | Freq: Two times a day (BID) | ORAL | 0 refills | Status: DC

## 2022-02-23 MED ORDER — IPRATROPIUM-ALBUTEROL 0.5-2.5 (3) MG/3ML IN SOLN: 3.0000 mL | Freq: Four times a day (QID) | RESPIRATORY_TRACT | 1 refills | Status: DC | PRN

## 2022-02-23 MED ORDER — AZITHROMYCIN 500 MG PO TABS: ORAL_TABLET | ORAL | 0 refills | Status: DC

## 2022-02-23 MED ORDER — BUDESONIDE-FORMOTEROL FUMARATE 80-4.5 MCG/ACT IN AERO: 2.0000 | INHALATION_SPRAY | Freq: Two times a day (BID) | RESPIRATORY_TRACT | 12 refills | Status: DC

## 2022-02-23 NOTE — Progress Notes (Signed)
?   02/23/22 1455  ?AVS Discharge Documentation  ?AVS Discharge Instructions Including Medications Provided to patient/caregiver  ?Name of Person Receiving AVS Discharge Instructions Including Medications Linda Foley  ?Name of Clinician That Reviewed AVS Discharge Instructions Including Medications Mahdi Frye  ? ? ?

## 2022-02-23 NOTE — Progress Notes (Signed)
SATURATION QUALIFICATIONS: (This note is used to comply with regulatory documentation for home oxygen) ? ?Patient Saturations on Room Air at Rest = 93% ? ?Patient Saturations on Room Air while Ambulating = 82% ? ?Patient Saturations on 4 Liters of oxygen while Ambulating = 92%. After turning the  Oxygen up to 5 Liters with patient continuing to ambulate Saturation maintained 92% ? ? ?

## 2022-02-23 NOTE — TOC Transition Note (Addendum)
Transition of Care (TOC) - CM/SW Discharge Note ? ? ?Patient Details  ?Name: ARIEONA MURCHIE ?MRN: [id number] ?Date of Birth: 02/22/1966 ? ?Transition of Care (TOC) CM/SW Contact:  ?Ross Ludwig, LCSW ?Phone Number: ?02/23/2022, 12:05 PM ? ? ?Clinical Narrative:    ? ?TOC was informed that patient needed oxygen and a nebulizer.  Patient does not have any insurance, [employer] has worked with her in the past.  CSW made referral and ordered equipment.  CSW contacted [employer] and spoke to Crystal, someone will reach out to patient and discuss getting her equipment.  Equipment to be delivered to patient in her room before she leaves. ? ?Final next level of care: Home/Self Care ?Barriers to Discharge: Barriers Resolved ? ? ?Patient Goals and CMS Choice ?Patient states their goals for this hospitalization and ongoing recovery are:: To return back home with oxygen and a nebullizer ?CMS Medicare.gov Compare Post Acute Care list provided to:: Patient ?Choice offered to / list presented to : Patient ? ?Discharge Placement ?  ?           ?  ?  ?  ?  ? ?Discharge Plan and Services ?  ?  ?           ?DME Arranged: Nebulizer machine, Oxygen ?DME Agency: [employer] ?Date DME Agency Contacted: 02/23/22 ?Time DME Agency Contacted: [phone] spoke with at DME Agency: Delana Meyer ?  ?  ?  ?  ?  ? ?Social Determinants of Health (SDOH) Interventions ?  ? ? ?Readmission Risk Interventions ?No flowsheet data found. ? ? ? ? ?

## 2022-02-23 NOTE — Discharge Summary (Signed)
Physician Discharge Summary   Patient: Linda Foley MRN: [id number] DOB: 1966/01/26  Admit date:     02/21/2022  Discharge date: 02/23/22  Discharge Physician: Meredeth Ide   PCP: Pcp, No   Recommendations at discharge:    Follow-up PCP in 2 weeks  Discharge Diagnoses: Principal Problem:   COPD exacerbation (HCC) Active Problems:   Tobacco abuse  Resolved Problems:   * No resolved hospital problems. *   Hospital Course: 56 year old female with medical history of COPD, polysubstance abuse, tobacco abuse presented with respiratory distress.  Patient reported 3-day history of wheezing and productive cough.  Patient came to ED and was found to be in COPD exacerbation  Assessment and Plan:  Acute on chronic hypoxemic respiratory failure -Resolved, back to baseline -Patient was started on BiPAP which has been weaned off -She is back on 3 L/min of oxygen via nasal cannula which is her baseline -Patient requires up to 5 L/min on ambulation; she uses up to 5 L/min of oxygen at home    COPD exacerbation -Patient has improved with DuoNeb, prednisone, Zithromax, guaifenesin -We will discharge on Symbicort 2 puffs twice a day, DuoNeb nebulizers every 6 hours as needed, Zithromax 500 mg p.o. once daily for 2 days, prednisone taper, Mucinex             Consultants:  Procedures performed:  Disposition: Home Diet recommendation:  Discharge Diet Orders (From admission, onward)     Start     Ordered   02/23/22 0000  Diet - low sodium heart healthy        02/23/22 1151           Cardiac diet  DISCHARGE MEDICATION: Allergies as of 02/23/2022       Reactions   Penicillins Nausea And Vomiting, Other (See Comments)   Has patient had a PCN reaction causing immediate rash, facial/tongue/throat swelling, SOB or lightheadedness with hypotension: no Has patient had a PCN reaction causing severe rash involving mucus membranes or skin necrosis: no Has patient had a PCN  reaction that required hospitalization no Has patient had a PCN reaction occurring within the last 10 years: unknown If all of the above answers are "NO", then may proceed with Cephalosporin use.        Medication List     TAKE these medications    albuterol 108 (90 Base) MCG/ACT inhaler Commonly known as: VENTOLIN HFA Inhale 2 puffs into the lungs every 6 (six) hours as needed for wheezing or shortness of breath.   azithromycin 500 MG tablet Commonly known as: ZITHROMAX Take for 2 days Start taking on: February 24, 2022 What changed:  medication strength how much to take how to take this when to take this additional instructions   budesonide-formoterol 80-4.5 MCG/ACT inhaler Commonly known as: Symbicort Inhale 2 puffs into the lungs in the morning and at bedtime.   guaiFENesin 600 MG 12 hr tablet Commonly known as: MUCINEX Take 1 tablet (600 mg total) by mouth 2 (two) times daily.   ipratropium-albuterol 0.5-2.5 (3) MG/3ML Soln Commonly known as: DUONEB Take 3 mLs by nebulization every 6 (six) hours as needed.   predniSONE 10 MG tablet Commonly known as: DELTASONE Prednisone 40 mg po daily x 1 day then Prednisone 30 mg po daily x 1 day then Prednisone 20 mg po daily x 1 day then Prednisone 10 mg daily x 1 day then stop...         Discharge Exam: Ceasar Mons Weights   02/21/22  0139 02/21/22 1041  Weight: 65.8 kg 54.4 kg   General-appears in no acute distress Heart-S1-S2, regular, no murmur auscultated Lungs-clear to auscultation bilaterally, no wheezing or crackles auscultated Abdomen-soft, nontender, no organomegaly Extremities-no edema in the lower extremities Neuro-alert, oriented x3, no focal deficit noted  Condition at discharge: good  The results of significant diagnostics from this hospitalization (including imaging, microbiology, ancillary and laboratory) are listed below for reference.   Imaging Studies: DG Chest Port 1 View  Result Date:  02/21/2022 CLINICAL DATA:  Questionable sepsis.  Shortness of breath EXAM: PORTABLE CHEST 1 VIEW COMPARISON:  02/19/2022 FINDINGS: Hyperinflation. Heart and mediastinal contours are within normal limits. No focal opacities or effusions. No acute bony abnormality. IMPRESSION: Hyperinflation.  No active cardiopulmonary disease. Electronically Signed   By: Charlett Nose M.D.   On: 02/21/2022 01:59   DG Chest Portable 1 View  Result Date: 02/19/2022 CLINICAL DATA:  Shortness of breath. EXAM: PORTABLE CHEST 1 VIEW COMPARISON:  November 13, 2021. FINDINGS: The heart size and mediastinal contours are within normal limits. Both lungs are clear. The visualized skeletal structures are unremarkable. IMPRESSION: No active disease. Electronically Signed   By: Lupita Raider M.D.   On: 02/19/2022 16:12    Microbiology: Results for orders placed or performed during the hospital encounter of 02/21/22  Blood Culture (routine x 2)     Status: None (Preliminary result)   Collection Time: 02/21/22  1:37 AM   Specimen: BLOOD  Result Value Ref Range Status   Specimen Description   Final    BLOOD LEFT ARM Performed at [facility], [address], [place], [place] [address]    Special Requests   Final    BOTTLES DRAWN AEROBIC ONLY Blood Culture adequate volume Performed at [facility], [address] [place], [place] [address]    Culture   Final    NO GROWTH 2 DAYS Performed at [facility], [address] [place], [place] [address]    Report Status PENDING  Incomplete  Resp Panel by RT-PCR (Flu A&B, Covid) Nasopharyngeal Swab     Status: None   Collection Time: 02/21/22  1:47 AM   Specimen: Nasopharyngeal Swab; Nasopharyngeal(NP) swabs in vial transport medium  Result Value Ref Range Status   SARS Coronavirus 2 by RT PCR NEGATIVE NEGATIVE Final    Comment: (NOTE) SARS-CoV-2 target nucleic acids are NOT DETECTED.  The SARS-CoV-2 RNA is generally detectable in  upper respiratory specimens during the acute phase of infection. The lowest concentration of SARS-CoV-2 viral copies this assay can detect is 138 copies/mL. A negative result does not preclude SARS-Cov-2 infection and should not be used as the sole basis for treatment or other patient management decisions. A negative result may occur with  improper specimen collection/handling, submission of specimen other than nasopharyngeal swab, presence of viral mutation(s) within the areas targeted by this assay, and inadequate number of viral copies(<138 copies/mL). A negative result must be combined with clinical observations, patient history, and epidemiological information. The expected result is Negative.  Fact Sheet for Patients:  BloggerCourse.com  Fact Sheet for Healthcare Providers:  SeriousBroker.it  This test is no t yet approved or cleared by the [place] FDA and  has been authorized for detection and/or diagnosis of SARS-CoV-2 by FDA under an Emergency Use Authorization (EUA). This EUA will remain  in effect (meaning this test can be used) for the duration of the COVID-19 declaration under Section 564(b)(1) of the Act,  21 U.S.C.section 360bbb-3(b)(1), unless the authorization is terminated  or revoked sooner.       Influenza A by PCR NEGATIVE NEGATIVE Final   Influenza B by PCR NEGATIVE NEGATIVE Final    Comment: (NOTE) The Xpert Xpress SARS-CoV-2/FLU/RSV plus assay is intended as an aid in the diagnosis of influenza from Nasopharyngeal swab specimens and should not be used as a sole basis for treatment. Nasal washings and aspirates are unacceptable for Xpert Xpress SARS-CoV-2/FLU/RSV testing.  Fact Sheet for Patients: BloggerCourse.com  Fact Sheet for Healthcare Providers: SeriousBroker.it  This test is not yet approved or cleared by the [place] FDA and has been  authorized for detection and/or diagnosis of SARS-CoV-2 by FDA under an Emergency Use Authorization (EUA). This EUA will remain in effect (meaning this test can be used) for the duration of the COVID-19 declaration under Section 564(b)(1) of the Act, 21 U.S.C. section 360bbb-3(b)(1), unless the authorization is terminated or revoked.  Performed at [facility], [address], [place], [place] [address]   Blood Culture (routine x 2)     Status: None (Preliminary result)   Collection Time: 02/21/22  1:50 AM   Specimen: BLOOD  Result Value Ref Range Status   Specimen Description   Final    BLOOD Performed at [facility], [address] [place], [place] [address]    Special Requests   Final    BOTTLES DRAWN AEROBIC AND ANAEROBIC Blood Culture adequate volume Performed at [facility], [address] [place], [place] [address]    Culture   Final    NO GROWTH 2 DAYS Performed at [facility], [address], [place], [place] [address]    Report Status PENDING  Incomplete  MRSA Next Gen by PCR, Nasal     Status: Abnormal   Collection Time: 02/21/22 10:35 AM   Specimen: Nasal Mucosa; Nasal Swab  Result Value Ref Range Status   MRSA by PCR Next Gen DETECTED (A) NOT DETECTED Final    Comment: (NOTE) The GeneXpert MRSA Assay (FDA approved for NASAL specimens only), is one component of a comprehensive MRSA colonization surveillance program. It is not intended to diagnose MRSA infection nor to guide or monitor treatment for MRSA infections. Test performance is not FDA approved in patients less than 70 years old. Performed at [facility], [address], [place], [place] [address]     Labs: CBC: Recent Labs  Lab 02/19/22 1546 02/21/22 0147 02/22/22 0303  WBC 7.0 11.9* 12.5*  NEUTROABS 4.4 7.9*  --   HGB 16.7* 17.0* 15.5*  HCT 51.7* 53.5* 49.4*  MCV 95.9 98.5 99.6  PLT 243 283 210   Basic  Metabolic Panel: Recent Labs  Lab 02/19/22 1546 02/21/22 0147 02/22/22 0303  NA 138 138 137  K 3.8 4.3 4.3  CL 104 103 99  CO2 27 26 28   GLUCOSE 138* 126* 72  BUN 13 19 20   CREATININE 0.58 0.74 0.55  CALCIUM 8.8* 8.8* 8.9   Liver Function Tests: Recent Labs  Lab 02/19/22 1546 02/21/22 0147 02/22/22 0303  AST 24 29 23   ALT 23 23 21   ALKPHOS 49 51 44  BILITOT 0.6 1.1 0.4  PROT 7.3 8.1 7.3  ALBUMIN 3.6 4.2 3.7   CBG: No results for input(s): GLUCAP in the last 168 hours.  Discharge time spent: greater than 30 minutes.  Signed: 04/23/22, MD [facility] 02/23/2022
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
## 2022-02-26 LAB — CULTURE, BLOOD (ROUTINE X 2)
Culture: NO GROWTH
Culture: NO GROWTH
Special Requests: ADEQUATE
Special Requests: ADEQUATE

## 2022-03-03 ENCOUNTER — Encounter (HOSPITAL_COMMUNITY): Payer: Self-pay

## 2022-03-03 ENCOUNTER — Other Ambulatory Visit: Payer: Self-pay

## 2022-03-03 ENCOUNTER — Emergency Department (HOSPITAL_COMMUNITY)
Admission: EM | Admit: 2022-03-03 | Discharge: 2022-03-03 | Disposition: A | Discharge status: Home / Self Care | Payer: Self-pay | Attending: Emergency Medicine | Admitting: Emergency Medicine

## 2022-03-03 DIAGNOSIS — J449 Chronic obstructive pulmonary disease, unspecified: Secondary | ICD-10-CM | POA: Insufficient documentation

## 2022-03-03 DIAGNOSIS — F17200 Nicotine dependence, unspecified, uncomplicated: Secondary | ICD-10-CM | POA: Insufficient documentation

## 2022-03-03 DIAGNOSIS — Z7951 Long term (current) use of inhaled steroids: Secondary | ICD-10-CM | POA: Insufficient documentation

## 2022-03-03 DIAGNOSIS — M62838 Other muscle spasm: Secondary | ICD-10-CM

## 2022-03-03 DIAGNOSIS — M62830 Muscle spasm of back: Secondary | ICD-10-CM | POA: Insufficient documentation

## 2022-03-03 MED ORDER — METHOCARBAMOL 500 MG PO TABS
500.0000 mg | ORAL_TABLET | Freq: Once | ORAL | Status: AC
  Administered 2022-03-03: 500 mg via ORAL
  Filled 2022-03-03: qty 1

## 2022-03-03 MED ORDER — METHOCARBAMOL 500 MG PO TABS: 500.0000 mg | ORAL_TABLET | Freq: Two times a day (BID) | ORAL | 0 refills | Status: DC

## 2022-03-03 MED ORDER — LORAZEPAM 2 MG/ML IJ SOLN
1.0000 mg | Freq: Once | INTRAMUSCULAR | Status: AC
  Administered 2022-03-03: 1 mg via INTRAMUSCULAR
  Filled 2022-03-03: qty 1

## 2022-03-03 NOTE — ED Provider Notes (Signed)
[facility]-EMERGENCY DEPT Provider Note   CSN: [id number] Arrival date & time: 03/03/22  [id number]     History  No chief complaint on file.   Linda Foley is a 56 y.o. female.  HPI Patient is a 56 year old female presented emergency room today with complaints of right-sided low back pain seems to radiate into her right leg.  Linda Foley is a 56 year old female with a history of COPD, polysubstance abuse, and tobacco abuse who presents to the ED for lower back pain. She states her pain started suddenly yesterday morning and woke her from her sleep. The pain occurs in her lower back and radiates down her right lower extremity. The pain is constant, rated 10/10, and worsened with movement. She denies any precipitating trauma, new physical activity, or different sleep position. The patient denies ever having similar episodes in the past. She has tried Advil with no relief of symptoms. Patient endorses numbing and tingling of the right lower extremity but denies any fever, chills, headache, lightheadedness, hemoptysis, abdominal pain, N/V/D/C, saddle anesthesia, or muscle weakness. She denies any recent or current IV drug use and states her last use of cocaine was a few days ago.       Home Medications Prior to Admission medications   Medication Sig Start Date End Date Taking? Authorizing Provider  methocarbamol (ROBAXIN) 500 MG tablet Take 1 tablet (500 mg total) by mouth 2 (two) times daily. 03/03/22  Yes Mozel Burdett S, PA  albuterol (PROVENTIL HFA;VENTOLIN HFA) 108 (90 Base) MCG/ACT inhaler Inhale 2 puffs into the lungs every 6 (six) hours as needed for wheezing or shortness of breath. 06/21/18 02/22/23  Dimple NanasAmin, Ankit Chirag, MD  azithromycin (ZITHROMAX) 500 MG tablet Take for 2 days 02/24/22   Meredeth IdeLama, Gagan S, MD  budesonide-formoterol [facility](SYMBICORT) 80-4.5 MCG/ACT inhaler Inhale 2 puffs into the lungs in the morning and at bedtime. 02/23/22   Meredeth IdeLama, Gagan S, MD  guaiFENesin  (MUCINEX) 600 MG 12 hr tablet Take 1 tablet (600 mg total) by mouth 2 (two) times daily. 02/23/22   Meredeth IdeLama, Gagan S, MD  ipratropium-albuterol (DUONEB) 0.5-2.5 (3) MG/3ML SOLN Take 3 mLs by nebulization every 6 (six) hours as needed. 02/23/22   Meredeth IdeLama, Gagan S, MD  predniSONE (DELTASONE) 10 MG tablet Prednisone 40 mg po daily x 1 day then Prednisone 30 mg po daily x 1 day then Prednisone 20 mg po daily x 1 day then Prednisone 10 mg daily x 1 day then stop... 02/23/22   Meredeth IdeLama, Gagan S, MD      Allergies    Penicillins    Review of Systems   Review of Systems  Physical Exam Updated Vital Signs BP 115/62    Pulse 85    Temp 98.2 F (36.8 C) (Oral)    Resp 15    Ht 5\' 3"  (1.6 m)    Wt 56.7 kg    LMP 12/12/2011    SpO2 99%    BMI 22.14 kg/m  Physical Exam Vitals and nursing note reviewed.  Constitutional:      General: She is in acute distress.  HENT:     Head: Normocephalic and atraumatic.     Nose: Nose normal.     Mouth/Throat:     Mouth: Mucous membranes are moist.  Eyes:     General: No scleral icterus. Cardiovascular:     Rate and Rhythm: Normal rate and regular rhythm.     Pulses: Normal pulses.     Heart sounds: Normal  heart sounds.  Pulmonary:     Effort: Pulmonary effort is normal. No respiratory distress.     Breath sounds: No wheezing.  Abdominal:     Palpations: Abdomen is soft.     Tenderness: There is no abdominal tenderness. There is no guarding or rebound.  Musculoskeletal:     Cervical back: Normal range of motion.     Right lower leg: No edema.     Left lower leg: No edema.     Comments: No mildline C/T/L spine TTP   Muscular TTP of right-sided paralumbar musculature.  Skin:    General: Skin is warm and dry.     Capillary Refill: Capillary refill takes less than 2 seconds.  Neurological:     Mental Status: She is alert. Mental status is at baseline.  Psychiatric:        Mood and Affect: Mood normal.        Behavior: Behavior normal.    ED Results / Procedures  / Treatments   Labs (all labs ordered are listed, but only abnormal results are displayed) Labs Reviewed - No data to display  EKG None  Radiology No results found.  Procedures Procedures    Medications Ordered in ED Medications  methocarbamol (ROBAXIN) tablet 500 mg (500 mg Oral Given 03/03/22 1014)  LORazepam (ATIVAN) injection 1 mg (1 mg Intramuscular Given 03/03/22 1015)    ED Course/ Medical Decision Making/ A&P                           Medical Decision Making Risk Prescription drug management.   This patient presents to the ED for concern of back pain, this involves a number of treatment options, and is a complaint that carries with it a high risk of complications and morbidity.   The emergent differential diagnosis for back pain includes but is not limited to fracture, muscle strain, cauda equina, spinal stenosis. DDD, ankylosing spondylitis, acute ligamentous injury, disk herniation, spondylolisthesis, Epidural compression syndrome, metastatic cancer, transverse myelitis, vertebral osteomyelitis, diskitis, kidney stone, pyelonephritis, AAA, Perforated ulcer, Retrocecal appendicitis, pancreatitis, bowel obstruction, retroperitoneal hemorrhage or mass, meningitis.    Co morbidities: Discussed in HPI   Brief History:  Pt with some     EMR reviewed including pt PMHx, past surgical history and past visits to ER.   See HPI for more details   Lab Tests:     Imaging Studies:  No imaging studies ordered for this patient    Cardiac Monitoring:  NA NA   Medicines ordered:  I ordered medication including ativan/robaxin  for muscle spasm Reevaluation of the patient after these medicines showed that the patient resolved I have reviewed the patients home medicines and have made adjustments as needed   Critical Interventions:     Consults/Attending Physician      Reevaluation:  After the interventions noted above I re-evaluated patient and  found that they have :resolved   Social Determinants of Health:  The patient's social determinants of health were a factor in the care of this patient    Problem List / ED Course:  Muscle spasm  Broad differential for back pain considered includes malignancy, disc herniation, spinal epidural abscess, spinal fracture, cauda equina, pyelonephritis, kidney stone, AAA, AD, pancreatitis, PE and PTX.   History without symptoms of urinary or stool retention or incontinence, neurologic changes such as sensation change or weakness lower extremities, coagulopathy or blood thinner use, is not elderly or with history  of osteoporosis, denies any history of cancer, fever, IV drug use, weight changes (unexplained), or prolonged steroid use.   Physical exam most consistent with muscular strain. Doubt cauda equina or disc herniation d/t lack of saddle anesthesia/bowel or bladder incontinence or urinary retention, normal gait and reassuring physical examination without neurologic deficits.   History is not supportive of kidney stone, AAA, AD, pancreatitis, PE or PTX. Patient has no CVA tenderness or urinary sx to suggest pyelonephritis or kidney stone.   Will manage patient conservatively at this time. NSAIDs, back exercises/stretches, heat therapy and follow up with PCP if symptoms do not resolve in 3-4 weeks. Patient offered muscle relaxer for comfort at night. Counseled on need to return to ED for fever, worsening or concerning symptoms. Patient agreeable to plan and states understanding of follow up plans and return precautions.     Vitals WNL at time of discharge and patient is no acute distress    Dispostion:  After consideration of the diagnostic results and the patients response to treatment, I feel that the patent would benefit from FU with PCP she already has PCP follow-up.  Return precautions given.  Recommended she stop smoking cocaine.  She does not use any intravenous drugs however I did  counsel her on initiating this.    Final Clinical Impression(s) / ED Diagnoses Final diagnoses:  Muscle spasm    Rx / DC Orders ED Discharge Orders          Ordered    methocarbamol (ROBAXIN) 500 MG tablet  2 times daily        03/03/22 1119              Solon Augusta [place], [place] 03/03/22 1215    Derwood Kaplan, MD 03/04/22 1046

## 2022-03-03 NOTE — Discharge Instructions (Addendum)
Please follow-up with the primary care office that you told me that you have already initiated with. ? ?Please take muscle relaxer as prescribed, Tylenol, ibuprofen prescribed below.  Drink plenty of water eat 3 square meals per day and stop using cocaine ?

## 2022-03-03 NOTE — ED Triage Notes (Signed)
Pt BIB [facility] from home c/o [facility] and lower back pain that radiates down to the right leg. Hx COPD. Patient is on 5L at baseline.  ? ?Vital signs were:  ?136/78 ?80-HR ?98% 5L ?

## 2022-03-05 ENCOUNTER — Other Ambulatory Visit: Payer: Self-pay

## 2022-03-05 ENCOUNTER — Emergency Department (HOSPITAL_COMMUNITY)
Admission: EM | Admit: 2022-03-05 | Discharge: 2022-03-05 | Disposition: A | Discharge status: Home / Self Care | Payer: Self-pay | Attending: Emergency Medicine | Admitting: Emergency Medicine

## 2022-03-05 ENCOUNTER — Encounter (HOSPITAL_COMMUNITY): Payer: Self-pay | Admitting: Pharmacy Technician

## 2022-03-05 DIAGNOSIS — M5431 Sciatica, right side: Secondary | ICD-10-CM | POA: Insufficient documentation

## 2022-03-05 DIAGNOSIS — J449 Chronic obstructive pulmonary disease, unspecified: Secondary | ICD-10-CM | POA: Insufficient documentation

## 2022-03-05 DIAGNOSIS — Z7952 Long term (current) use of systemic steroids: Secondary | ICD-10-CM | POA: Insufficient documentation

## 2022-03-05 DIAGNOSIS — Z7951 Long term (current) use of inhaled steroids: Secondary | ICD-10-CM | POA: Insufficient documentation

## 2022-03-05 MED ORDER — GABAPENTIN 300 MG PO CAPS: 300.0000 mg | ORAL_CAPSULE | Freq: Three times a day (TID) | ORAL | 0 refills | Status: DC

## 2022-03-05 MED ORDER — LIDOCAINE 5 % EX PTCH: 1.0000 | MEDICATED_PATCH | CUTANEOUS | 0 refills | Status: AC

## 2022-03-05 MED ORDER — CYCLOBENZAPRINE HCL 10 MG PO TABS
5.0000 mg | ORAL_TABLET | Freq: Once | ORAL | Status: AC
  Administered 2022-03-05: 5 mg via ORAL
  Filled 2022-03-05: qty 1

## 2022-03-05 MED ORDER — PREDNISONE 10 MG (21) PO TBPK: ORAL_TABLET | Freq: Every day | ORAL | 0 refills | Status: DC

## 2022-03-05 MED ORDER — NAPROXEN 500 MG PO TABS: 500.0000 mg | ORAL_TABLET | Freq: Two times a day (BID) | ORAL | 0 refills | Status: AC

## 2022-03-05 MED ORDER — LIDOCAINE 5 % EX PTCH
1.0000 | MEDICATED_PATCH | CUTANEOUS | Status: DC
  Administered 2022-03-05: 1 via TRANSDERMAL
  Filled 2022-03-05: qty 1

## 2022-03-05 MED ORDER — KETOROLAC TROMETHAMINE 30 MG/ML IJ SOLN
30.0000 mg | Freq: Once | INTRAMUSCULAR | Status: AC
  Administered 2022-03-05: 30 mg via INTRAMUSCULAR
  Filled 2022-03-05: qty 1

## 2022-03-05 NOTE — Discharge Instructions (Addendum)
Please follow up with your PCP.  ?I have prescribed you a steroid course. This will help the inflammation and overall help your pain. I have also prescribed Naproxen which should be taken twice a day. This will help reduce the inflammation. Lidocaine patches and gabapentin can be used to supplement pain management.  ?

## 2022-03-05 NOTE — ED Notes (Signed)
Patient Alert and oriented to baseline. Stable and ambulatory to baseline. Patient verbalized understanding of the discharge instructions.  Patient belongings were taken by the patient.  fd ? ?

## 2022-03-05 NOTE — ED Provider Notes (Signed)
[facility] ?[name] EMERGENCY DEPARTMENT ?Provider Note ? ? ?CSN: [id number] ?Arrival date & time: 03/05/22  1660 ? ?  ? ?History ?Past medical history of COPD, polysubstance abuse, tobacco abuse, and anxiety  ?Chief Complaint  ?Patient presents with  ? Hip Pain  ? ? ?Linda Foley is a 56 y.o. female. ?Patient presents the ED with a chief complaint of right lower back pain.  She says the symptoms started 2 days ago suddenly when she woke up from bed.  At first it started in her right lower back and felt like it was extending into her buttocks on the right side.  She ended up going to [facility].  She was sent home with Robaxin.  She states she has been taking the Robaxin regularly without any relief.  She says the symptoms have gotten much worse and now her pain radiates down to her right foot.  She also endorses some tingling sensations on the lateral aspect of her right calf and her right lateral foot.  She continues to deny any history of IV drug use, diabetes, fevers, chills, saddle anesthesia, bowel or bladder dysfunction, or gait abnormalities. ? ?Hip Pain ? ? ?  ? ?Home Medications ?Prior to Admission medications   ?Medication Sig Start Date End Date Taking? Authorizing Provider  ?gabapentin (NEURONTIN) 300 MG capsule Take 1 capsule (300 mg total) by mouth 3 (three) times daily for 7 days. 03/05/22 03/12/22 Yes Kirstine Jacquin, Finis Bud, PA-C  ?lidocaine (LIDODERM) 5 % Place 1 patch onto the skin daily for 7 days. Remove & Discard patch within 12 hours or as directed by MD 03/05/22 03/12/22 Yes Atiyah Bauer, Finis Bud, PA-C  ?naproxen (NAPROSYN) 500 MG tablet Take 1 tablet (500 mg total) by mouth 2 (two) times daily with a meal for 7 days. 03/05/22 03/12/22 Yes Tamim Skog, Finis Bud, PA-C  ?predniSONE (STERAPRED UNI-PAK 21 TAB) 10 MG (21) TBPK tablet Take by mouth daily. Take 6 tabs by mouth daily  for 2 days, then 5 tabs for 2 days, then 4 tabs for 2 days, then 3 tabs for 2 days, 2 tabs for 2 days, then  1 tab by mouth daily for 2 days 03/05/22  Yes Iban Utz, Finis Bud, PA-C  ?albuterol (PROVENTIL HFA;VENTOLIN HFA) 108 (90 Base) MCG/ACT inhaler Inhale 2 puffs into the lungs every 6 (six) hours as needed for wheezing or shortness of breath. 06/21/18 02/22/23  Dimple Nanas, MD  ?azithromycin (ZITHROMAX) 500 MG tablet Take for 2 days 02/24/22   Meredeth Ide, MD  ?budesonide-formoterol [facility]) 80-4.5 MCG/ACT inhaler Inhale 2 puffs into the lungs in the morning and at bedtime. 02/23/22   Meredeth Ide, MD  ?guaiFENesin (MUCINEX) 600 MG 12 hr tablet Take 1 tablet (600 mg total) by mouth 2 (two) times daily. 02/23/22   Meredeth Ide, MD  ?ipratropium-albuterol (DUONEB) 0.5-2.5 (3) MG/3ML SOLN Take 3 mLs by nebulization every 6 (six) hours as needed. 02/23/22   Meredeth Ide, MD  ?methocarbamol (ROBAXIN) 500 MG tablet Take 1 tablet (500 mg total) by mouth 2 (two) times daily. 03/03/22   Gailen Shelter, PA  ?   ? ?Allergies    ?Penicillins   ? ?Review of Systems   ?Review of Systems  ?Musculoskeletal:  Positive for back pain.  ?All other systems reviewed and are negative. ? ?Physical Exam ?Updated Vital Signs ?BP (!) 136/112   Pulse 87   Temp 98.3 ?F (36.8 ?C) (Oral)   Resp 18  LMP 12/12/2011   SpO2 94%  ?Physical Exam ?Vitals and nursing note reviewed.  ?Constitutional:   ?   General: She is not in acute distress. ?   Appearance: Normal appearance. She is well-developed. She is not ill-appearing, toxic-appearing or diaphoretic.  ?   Comments: Patient appears uncomfortable and is writhing around in bed.  She is perseverating on her pain and has decreased attention.  ?HENT:  ?   Head: Normocephalic and atraumatic.  ?   Nose: No nasal deformity.  ?   Mouth/Throat:  ?   Lips: Pink. No lesions.  ?Eyes:  ?   General: Gaze aligned appropriately. No scleral icterus.    ?   Right eye: No discharge.     ?   Left eye: No discharge.  ?   Conjunctiva/sclera: Conjunctivae normal.  ?   Right eye: Right conjunctiva is not injected. No  exudate or hemorrhage. ?   Left eye: Left conjunctiva is not injected. No exudate or hemorrhage. ?Pulmonary:  ?   Effort: Pulmonary effort is normal. No respiratory distress.  ?Musculoskeletal:  ?   Comments: There is no thoracic or lumbar midline TTP or step-offs noted.  Reproducible pain noted at the right iliac.  Patient has normal sensation along entirety of leg but does have paresthesias in the lateral lower extremity segment.  No paresthesias in the medial thigh distribution. She has 5 out of 5 motor strength of the right hip, right knee, and right ankles.  She has 2+ pedal and tibial dorsalis pulses. + straight leg test on right with pain extending into right lower leg.  ?Skin: ?   General: Skin is warm and dry.  ?Neurological:  ?   Mental Status: She is alert and oriented to person, place, and time.  ?Psychiatric:     ?   Mood and Affect: Mood normal.     ?   Speech: Speech normal.     ?   Behavior: Behavior normal. Behavior is cooperative.  ? ? ?ED Results / Procedures / Treatments   ?Labs ?(all labs ordered are listed, but only abnormal results are displayed) ?Labs Reviewed - No data to display ? ?EKG ?None ? ?Radiology ?No results found. ? ?Procedures ?Procedures  ? ? ?Medications Ordered in ED ?Medications  ?lidocaine (LIDODERM) 5 % 1 patch (1 patch Transdermal Patch Applied 03/05/22 1112)  ?ketorolac (TORADOL) 30 MG/ML injection 30 mg (30 mg Intramuscular Given 03/05/22 0949)  ?cyclobenzaprine (FLEXERIL) tablet 5 mg (5 mg Oral Given 03/05/22 0949)  ? ? ?ED Course/ Medical Decision Making/ A&P ?  ?                        ?Medical Decision Making ?Risk ?Prescription drug management. ? ? ? ?MDM  ?This is a 56 y.o. female who presents to the ED with right lower back pain with sciatic symptoms on the right side.  ?Paresthesias are in the S4 dermatomal distribution. ?The differential of this patient includes but is not limited to Cauda Equina, Compression Fracture, Epidural Abscess, herniated Disc, Kidney Stone,  Nonspecific, Pyelonephritis, Vertebral Fracture. ? ?My Impression, Plan, and ED Course: Patient is with no clear red flag symptoms.  I doubt this is cauda equina given the distribution of her symptoms.  There is no midline tenderness to suggest compression fracture.  No systemic symptoms to suggest epidural abscess.  Not consistent with Pyelo or Kidney stone. I do not think that imaging is warranted at this time. ?  Will try to treat pain with Toradol, Flexeril. Will reassess following.  ? ?After medications, patient continues to have pain. I will add on lidoderm patch. I told her that I don't think that it is warranted to treat her pain with narcotics. I will put her on a pain protocol at home that includes Prednisone taper, antiinflammatories, lidoderm patch, and gabapentin for nerve pain as needed. She is planning on following up with PCP this week. ? ? ?Charting Requirements ?Additional history is obtained from:  Independent historian ?External Records from outside source obtained and reviewed including: Recent ED note ?Social Determinants of Health:  Alcoholism/Drug Addiction ?Pertinant PMH that complicates patient's illness: polysubstance abuse ? ?Patient Care ?Problems that were addressed during this visit: ?- Sciatica: Acute illness ?Medications given in ED: Flexeril, toradol, lidocaine patch ?Reevaluation of the patient after these medicines showed that the patient stayed the same ?Disposition: pain regimen, f/u with pcp ? ?I have discussed this patient with my attending physician, Dr. Karene FryLawsing who has made changes to the plan accordingly. ? ?Portions of this note were generated with Scientist, clinical (histocompatibility and immunogenetics)Dragon dictation software. Dictation errors may occur despite best attempts at proofreading. ?  ? ?  ?  ?  ?Final Clinical Impression(s) / ED Diagnoses ?Final diagnoses:  ?Sciatica of right side  ? ? ?Rx / DC Orders ?ED Discharge Orders   ? ?      Ordered  ?  naproxen (NAPROSYN) 500 MG tablet  2 times daily with meals       ?  03/05/22 1121  ?  predniSONE (STERAPRED UNI-PAK 21 TAB) 10 MG (21) TBPK tablet  Daily       ? 03/05/22 1121  ?  lidocaine (LIDODERM) 5 %  Every 24 hours       ? 03/05/22 1121  ?  gabapentin (NEURONTIN) 300 M

## 2022-03-05 NOTE — ED Triage Notes (Signed)
Pt bib [facility] with reports of R hip pain radiating down R leg onset 2 days ago. Seen yesterday for same. VSS with ems.  ?

## 2022-05-01 ENCOUNTER — Emergency Department (HOSPITAL_COMMUNITY)
Admission: EM | Admit: 2022-05-01 | Discharge: 2022-05-01 | Discharge status: Against Medical Advice | Payer: Self-pay | Attending: Emergency Medicine | Admitting: Emergency Medicine

## 2022-05-01 ENCOUNTER — Emergency Department (HOSPITAL_COMMUNITY): Payer: Self-pay

## 2022-05-01 ENCOUNTER — Other Ambulatory Visit: Payer: Self-pay

## 2022-05-01 ENCOUNTER — Encounter (HOSPITAL_COMMUNITY): Payer: Self-pay

## 2022-05-01 DIAGNOSIS — R059 Cough, unspecified: Secondary | ICD-10-CM | POA: Insufficient documentation

## 2022-05-01 DIAGNOSIS — R519 Headache, unspecified: Secondary | ICD-10-CM | POA: Insufficient documentation

## 2022-05-01 DIAGNOSIS — Z5321 Procedure and treatment not carried out due to patient leaving prior to being seen by health care provider: Secondary | ICD-10-CM | POA: Insufficient documentation

## 2022-05-01 DIAGNOSIS — M791 Myalgia, unspecified site: Secondary | ICD-10-CM | POA: Insufficient documentation

## 2022-05-01 DIAGNOSIS — Z20822 Contact with and (suspected) exposure to covid-19: Secondary | ICD-10-CM | POA: Insufficient documentation

## 2022-05-01 LAB — RESP PANEL BY RT-PCR (FLU A&B, COVID) ARPGX2
Influenza A by PCR: NEGATIVE
Influenza B by PCR: NEGATIVE
SARS Coronavirus 2 by RT PCR: NEGATIVE

## 2022-05-01 NOTE — ED Notes (Signed)
Patient decided to leave because she was hungry and couldn't wait any longer.  ?

## 2022-05-01 NOTE — ED Triage Notes (Signed)
Pt reports with cough, generalized body aches, and headache since yesterday.  ?

## 2023-02-14 ENCOUNTER — Other Ambulatory Visit: Payer: Self-pay

## 2023-02-14 ENCOUNTER — Emergency Department (HOSPITAL_COMMUNITY)
Admission: EM | Admit: 2023-02-14 | Discharge: 2023-02-14 | Disposition: A | Discharge status: Home / Self Care | Payer: 59 | Attending: Emergency Medicine | Admitting: Emergency Medicine

## 2023-02-14 DIAGNOSIS — R457 State of emotional shock and stress, unspecified: Secondary | ICD-10-CM | POA: Diagnosis not present

## 2023-02-14 DIAGNOSIS — J449 Chronic obstructive pulmonary disease, unspecified: Secondary | ICD-10-CM | POA: Diagnosis not present

## 2023-02-14 DIAGNOSIS — Z1152 Encounter for screening for COVID-19: Secondary | ICD-10-CM | POA: Insufficient documentation

## 2023-02-14 DIAGNOSIS — R519 Headache, unspecified: Secondary | ICD-10-CM | POA: Diagnosis not present

## 2023-02-14 DIAGNOSIS — J101 Influenza due to other identified influenza virus with other respiratory manifestations: Secondary | ICD-10-CM | POA: Insufficient documentation

## 2023-02-14 DIAGNOSIS — G4489 Other headache syndrome: Secondary | ICD-10-CM | POA: Diagnosis not present

## 2023-02-14 LAB — RESP PANEL BY RT-PCR (RSV, FLU A&B, COVID)  RVPGX2
Influenza A by PCR: POSITIVE — AB
Influenza B by PCR: NEGATIVE
Resp Syncytial Virus by PCR: NEGATIVE
SARS Coronavirus 2 by RT PCR: NEGATIVE

## 2023-02-14 MED ORDER — ALBUTEROL SULFATE HFA 108 (90 BASE) MCG/ACT IN AERS: 2.0000 | INHALATION_SPRAY | Freq: Four times a day (QID) | RESPIRATORY_TRACT | 1 refills | Status: DC | PRN

## 2023-02-14 MED ORDER — OSELTAMIVIR PHOSPHATE 75 MG PO CAPS: 75.0000 mg | ORAL_CAPSULE | Freq: Two times a day (BID) | ORAL | 0 refills | Status: DC

## 2023-02-14 NOTE — ED Provider Notes (Signed)
[place] Provider Note   CSN: [id number] Arrival date & time: 02/14/23  1633     History  Chief Complaint  Patient presents with   Headache    Linda Foley is a 57 y.o. female history of COPD who presented with HA.  Patient states she woke up today feeling unwell and generalized weakness.  Patient states headache is all over her head.  Patient was given Tylenol by EMS which is reportedly helped the symptoms.  Patient stated that she had a fever at home but does not know how high it was.  Patient does not endorse increased use of albuterol inhaler or sputum production.  Patient denied chest pain, dyspnea, abdominal pain, nausea/vomiting, loss of sensation/motor skills, dysuria, neck stiffness, sputum production, wheezing  Home Medications Prior to Admission medications   Medication Sig Start Date End Date Taking? Authorizing Provider  oseltamivir (TAMIFLU) 75 MG capsule Take 1 capsule (75 mg total) by mouth every 12 (twelve) hours. 02/14/23  Yes Linda Foley, Linda Route, PA-C  albuterol (VENTOLIN HFA) 108 (90 Base) MCG/ACT inhaler Inhale 2 puffs into the lungs every 6 (six) hours as needed for wheezing or shortness of breath. 02/14/23 03/16/23  Linda Hint, PA-C  azithromycin (ZITHROMAX) 500 MG tablet Take for 2 days 02/24/22   Linda Hillock, MD  budesonide-formoterol [facility]) 80-4.5 MCG/ACT inhaler Inhale 2 puffs into the lungs in the morning and at bedtime. 02/23/22   Linda Hillock, MD  gabapentin (NEURONTIN) 300 MG capsule Take 1 capsule (300 mg total) by mouth 3 (three) times daily for 7 days. 03/05/22 03/12/22  Linda Foley, Linda Fridge, PA-C  guaiFENesin (MUCINEX) 600 MG 12 hr tablet Take 1 tablet (600 mg total) by mouth 2 (two) times daily. 02/23/22   Linda Hillock, MD  ipratropium-albuterol (DUONEB) 0.5-2.5 (3) MG/3ML SOLN Take 3 mLs by nebulization every 6 (six) hours as needed. 02/23/22   Linda Hillock, MD  methocarbamol (ROBAXIN) 500 MG tablet  Take 1 tablet (500 mg total) by mouth 2 (two) times daily. 03/03/22   Linda Sias, PA  predniSONE (STERAPRED UNI-PAK 21 TAB) 10 MG (21) TBPK tablet Take by mouth daily. Take 6 tabs by mouth daily  for 2 days, then 5 tabs for 2 days, then 4 tabs for 2 days, then 3 tabs for 2 days, 2 tabs for 2 days, then 1 tab by mouth daily for 2 days 03/05/22   Linda Foley, Linda Fridge, PA-C      Allergies    Penicillins    Review of Systems   Review of Systems  Neurological:  Positive for headaches.  See HPI  Physical Exam Updated Vital Signs BP 129/67   Pulse 87   Resp 20   Wt 59 kg   LMP 12/12/2011   SpO2 94%   BMI 23.04 kg/m  Physical Exam Constitutional:      General: She is not in acute distress. HENT:     Head: Normocephalic and atraumatic.     Right Ear: Tympanic membrane, ear canal and external ear normal.     Left Ear: Tympanic membrane, ear canal and external ear normal.     Mouth/Throat:     Mouth: Mucous membranes are moist.  Eyes:     Extraocular Movements: Extraocular movements intact.     Conjunctiva/sclera: Conjunctivae normal.     Pupils: Pupils are equal, round, and reactive to light.  Cardiovascular:     Rate and Rhythm: Normal rate  and regular rhythm.     Pulses: Normal pulses.     Heart sounds: Normal heart sounds.     Comments: 2+ bilateral radial/posterior tibialis pulses with regular rate Pulmonary:     Effort: Pulmonary effort is normal. No respiratory distress.     Breath sounds: Normal breath sounds. No wheezing or rhonchi.  Abdominal:     General: Abdomen is flat.     Palpations: Abdomen is soft.     Tenderness: There is no abdominal tenderness. There is no guarding or rebound.  Musculoskeletal:        General: No tenderness. Normal range of motion.     Cervical back: Normal range of motion and neck supple. No tenderness.  Skin:    General: Skin is warm and dry.     Capillary Refill: Capillary refill takes less than 2 seconds.     Coloration: Skin is not  pale.  Neurological:     General: No focal deficit present.     Mental Status: She is alert and oriented to person, place, and time.  Psychiatric:        Mood and Affect: Mood normal.     ED Results / Procedures / Treatments   Labs (all labs ordered are listed, but only abnormal results are displayed) Labs Reviewed  RESP PANEL BY RT-PCR (RSV, FLU A&B, COVID)  RVPGX2 - Abnormal; Notable for the following components:      Result Value   Influenza A by PCR POSITIVE (*)    All other components within normal limits    EKG None  Radiology No results found.  Procedures Procedures    Medications Ordered in ED Medications - No data to display  ED Course/ Medical Decision Making/ A&P                             Medical Decision Making  Linda Foley 57 y.o. presented today for HA. Working DDx that I considered at this time includes, but not limited to, URI, meningitis.  Review of prior external notes: None  Unique Tests and My Interpretation:  Respiratory panel: Influenza A positive  Discussion with Independent Historian: None  Discussion of Management of Tests: None  Risk:    Medium:  - prescription drug management   Risk Stratification Score: None   R/o DDx: Meningitis: Patient denied any neck stiffness and does not appear altered  Plan: Patient presented for generalized bodyaches with headache.  Symptoms started this morning and patient was given a Tylenol by EMS which alleviated symptoms.  On exam patient had stable vitals and did not appear to be in distress.  Patient's respiratory panel came back positive for influenza A which would explain her symptoms.  Patient will be offered Tamiflu and given precautions.  Patient also states she needed another albuterol inhaler and so that will be prescribed for her as well.  Patient was given return precautions.patient stable for discharge at this time.  Patient verbalized understanding of  plan.         Final Clinical Impression(s) / ED Diagnoses Final diagnoses:  Influenza A    Rx / DC Orders ED Discharge Orders          Ordered    oseltamivir (TAMIFLU) 75 MG capsule  Every 12 hours        02/14/23 1954    albuterol (VENTOLIN HFA) 108 (90 Base) MCG/ACT inhaler  Every 6 hours PRN  02/14/23 1954              Linda Hint, PA-C 02/14/23 1956    [name], MD 02/18/23 [phone]

## 2023-02-14 NOTE — Discharge Instructions (Addendum)
Please pick up the Tamiflu I have prescribed for you and take as prescribed.  I have also prescribed for you another albuterol inhaler.  You may alternate every 6 hours as needed for pain between 1000 mg Tylenol and 400 mg ibuprofen.  I have attached a primary care provider for you to follow-up in the next few days if symptoms persist.  Please continue taking fluids and taking small amounts of food as tolerated.  If symptoms worsen please return to ER.

## 2023-02-14 NOTE — ED Triage Notes (Signed)
BIBA from home for SOB, headache, bodyaches, fever, and anxiety that started today. When asked about how high fever was at home pt states "I didn't take it"  Tylenol '650mg'$  admin by EMS   Hx copd.

## 2023-02-19 ENCOUNTER — Emergency Department (HOSPITAL_COMMUNITY): Payer: 59

## 2023-02-19 ENCOUNTER — Ambulatory Visit: Payer: Medicaid Other | Admitting: Student

## 2023-02-19 ENCOUNTER — Emergency Department (HOSPITAL_COMMUNITY)
Admission: EM | Admit: 2023-02-19 | Discharge: 2023-02-20 | Discharge status: Against Medical Advice | Payer: 59 | Attending: Emergency Medicine | Admitting: Emergency Medicine

## 2023-02-19 ENCOUNTER — Encounter: Payer: Self-pay | Admitting: Student

## 2023-02-19 DIAGNOSIS — Z5321 Procedure and treatment not carried out due to patient leaving prior to being seen by health care provider: Secondary | ICD-10-CM | POA: Diagnosis not present

## 2023-02-19 DIAGNOSIS — R0789 Other chest pain: Secondary | ICD-10-CM | POA: Insufficient documentation

## 2023-02-19 DIAGNOSIS — J439 Emphysema, unspecified: Secondary | ICD-10-CM | POA: Diagnosis not present

## 2023-02-19 DIAGNOSIS — R0602 Shortness of breath: Secondary | ICD-10-CM | POA: Diagnosis not present

## 2023-02-19 DIAGNOSIS — J101 Influenza due to other identified influenza virus with other respiratory manifestations: Secondary | ICD-10-CM | POA: Diagnosis not present

## 2023-02-19 DIAGNOSIS — R519 Headache, unspecified: Secondary | ICD-10-CM | POA: Insufficient documentation

## 2023-02-19 DIAGNOSIS — R509 Fever, unspecified: Secondary | ICD-10-CM | POA: Insufficient documentation

## 2023-02-19 LAB — CBC
HCT: 51.4 % — ABNORMAL HIGH (ref 36.0–46.0)
Hemoglobin: 17.9 g/dL — ABNORMAL HIGH (ref 12.0–15.0)
MCH: 32.3 pg (ref 26.0–34.0)
MCHC: 34.8 g/dL (ref 30.0–36.0)
MCV: 92.8 fL (ref 80.0–100.0)
Platelets: 138 10*3/uL — ABNORMAL LOW (ref 150–400)
RBC: 5.54 MIL/uL — ABNORMAL HIGH (ref 3.87–5.11)
RDW: 12.7 % (ref 11.5–15.5)
WBC: 11 10*3/uL — ABNORMAL HIGH (ref 4.0–10.5)
nRBC: 0 % (ref 0.0–0.2)

## 2023-02-19 LAB — BASIC METABOLIC PANEL
Anion gap: 12 (ref 5–15)
BUN: 12 mg/dL (ref 6–20)
CO2: 22 mmol/L (ref 22–32)
Calcium: 8.6 mg/dL — ABNORMAL LOW (ref 8.9–10.3)
Chloride: 98 mmol/L (ref 98–111)
Creatinine, Ser: 0.7 mg/dL (ref 0.44–1.00)
GFR, Estimated: >60 mL/min (ref >60)
Glucose, Bld: 112 mg/dL — ABNORMAL HIGH (ref 70–99)
Potassium: 3.6 mmol/L (ref 3.5–5.1)
Sodium: 132 mmol/L — ABNORMAL LOW (ref 135–145)

## 2023-02-19 LAB — TROPONIN I (HIGH SENSITIVITY): Troponin I (High Sensitivity): 6 ng/L (ref <18)

## 2023-02-19 NOTE — ED Notes (Signed)
Pt o2 sats range from 87-90% in triage, reports hx of COPD and suppose to be wearing oxygen, pt does not arrive to ED with home o2, reports "I can't carry that thing around" o2 applies in triage.

## 2023-02-19 NOTE — ED Notes (Signed)
Pt is eating a sandwich, and states she is leaving the facility due to the long wait. Pt was encouraged to stay and receive full  treatment. Pt states she is ready to go on home. Pts family seen pushing her out of the department with out her oxygen on.

## 2023-02-19 NOTE — ED Provider Triage Note (Addendum)
Emergency Medicine Provider Triage Evaluation Note  Linda Foley , a 57 y.o. female  was evaluated in triage.  Pt complains of concerns for flulike symptoms. Has associated subjective fever, chest wall pain, shortness of breath, headache.  Notes that she had similar symptoms when she was evaluated on 02/14/2023 but the symptoms are worsening at this time.  She has been compliant with the Tamiflu as well as over-the-counter Tylenol and ibuprofen.   Per patient chart review: Patient was evaluated emergency department 02/14/2023 at that time was diagnosed with flu a and given prescription for Tamiflu. Review of Systems  Positive:  Negative:   Physical Exam  BP (!) 121/90 (BP Location: Left Arm)   Pulse (!) 107   Temp 99.9 F (37.7 C) (Oral)   Resp 20   Ht '5\' 3"'$  (1.6 m)   Wt 59 kg   LMP 12/12/2011   SpO2 (!) 89%   BMI 23.03 kg/m  Gen:   Awake, no distress   Resp:  Normal effort  MSK:   Moves extremities without difficulty  Other:  Chest wall TTP noted diffusely  Medical Decision Making  Medically screening exam initiated at 7:21 PM.  Appropriate orders placed.  CLIFTON GRULLON was informed that the remainder of the evaluation will be completed by another provider, this initial triage assessment does not replace that evaluation, and the importance of remaining in the ED until their evaluation is complete.  Work-up initiated.    Jazmine Heckman A, PA-C 02/19/23 1921  7:24 PM - RN noted that patient O2 at 89% and pt is supposed to wear oxygen, however, pt notes that she wasn't due to not being able to carry the oxygen tank. Supplemental oxygen provided in triage via [place].    Kehlani Vancamp A, PA-C 02/19/23 1924

## 2023-02-19 NOTE — ED Triage Notes (Signed)
Pt to ED c/o flu like symptoms, reports dx with flu 5 days ago, but still does not feel better, no relief with OTC medications.

## 2023-03-06 ENCOUNTER — Ambulatory Visit: Payer: Medicaid Other | Admitting: Student

## 2023-03-06 NOTE — Progress Notes (Deleted)
   Subjective:  Patient ID: Linda Foley, female    DOB: 07/04/1966, 57 y.o.   MRN: [id number]  CC: New Patient  HPI:  Linda Foley is a very pleasant 57 y.o. female who presents today to establish care.  COPD:  Has been hospitalized in the past for COPD.  Currently taking***.  PMHx: Past Medical History:  Diagnosis Date   Anxiety    COPD (chronic obstructive pulmonary disease) ([place])    Polysubstance abuse ([place])     Surgical Hx: Past Surgical History:  Procedure Laterality Date   CESAREAN SECTION     CHOLECYSTECTOMY     TUBAL LIGATION      Family Hx: Family History  Problem Relation Age of Onset   Diabetes Mother     Social Hx: Current Social History   Who lives at home: *** 03/06/2023  Who would speak for you about health care matters: *** 03/06/2023  Transportation: *** 03/06/2023 Important Relationships & Pets: *** 03/06/2023  Current Stressors: *** 03/06/2023 Work / Education:  *** 03/06/2023 Religious / Personal Beliefs: *** 03/06/2023 Interests / Fun: *** 03/06/2023 Other: *** 03/06/2023   Medications:  Preventative Screening Colonoscopy: year*** results *** Mammogram: year*** results *** Pap test: year*** results *** PSA: year*** results *** DEXA: year*** results *** Tetanus vaccine: year*** results *** Pneumonia vaccine: year*** results *** Shingles vaccine: year*** results *** Heart stress test: year*** results *** Echocardiogram: year*** results *** Xrays: year*** results *** CT/MRI: year*** results ***  Smoking status reviewed  ROS: pertinent noted in the HPI   Objective:  LMP 12/12/2011  Vitals and nursing note reviewed  General: NAD, pleasant, able to participate in exam HEENT: normocephalic, TM's visualized bilaterally, no scleral icterus or conjunctival pallor, no nasal discharge, moist mucous membranes, good dentition without erythema or discharge noted in posterior oropharynx Neck: supple, non-tender, without  lymphadenopathy Cardiac: RRR, S1 S2 present. normal heart sounds, no murmurs. Respiratory: CTAB, normal effort, No wheezes, rales or rhonchi Abdomen: Normoactive bowel sounds, non-tender, non-distended, no hepatosplenomegaly Extremities: no edema or cyanosis. Skin: warm and dry, no rashes noted Neuro: alert, no obvious focal deficits Psych: Normal affect and mood  Assessment & Plan:  There are no diagnoses linked to this encounter. No follow-ups on file. Erskine Emery, MD 03/06/2023, 10:44 AM PGY-***, [place]

## 2023-03-18 IMAGING — DX DG CHEST 1V PORT
2 series · 2 of 2 positions shown · non-contrast
Comparison: Radiographs 06/19/2018 and 04/29/2018.

CLINICAL DATA: Cough and increasing shortness of breath. History of
COPD.

EXAM:
PORTABLE CHEST 1 VIEW

[chest ap (1 of 2)]
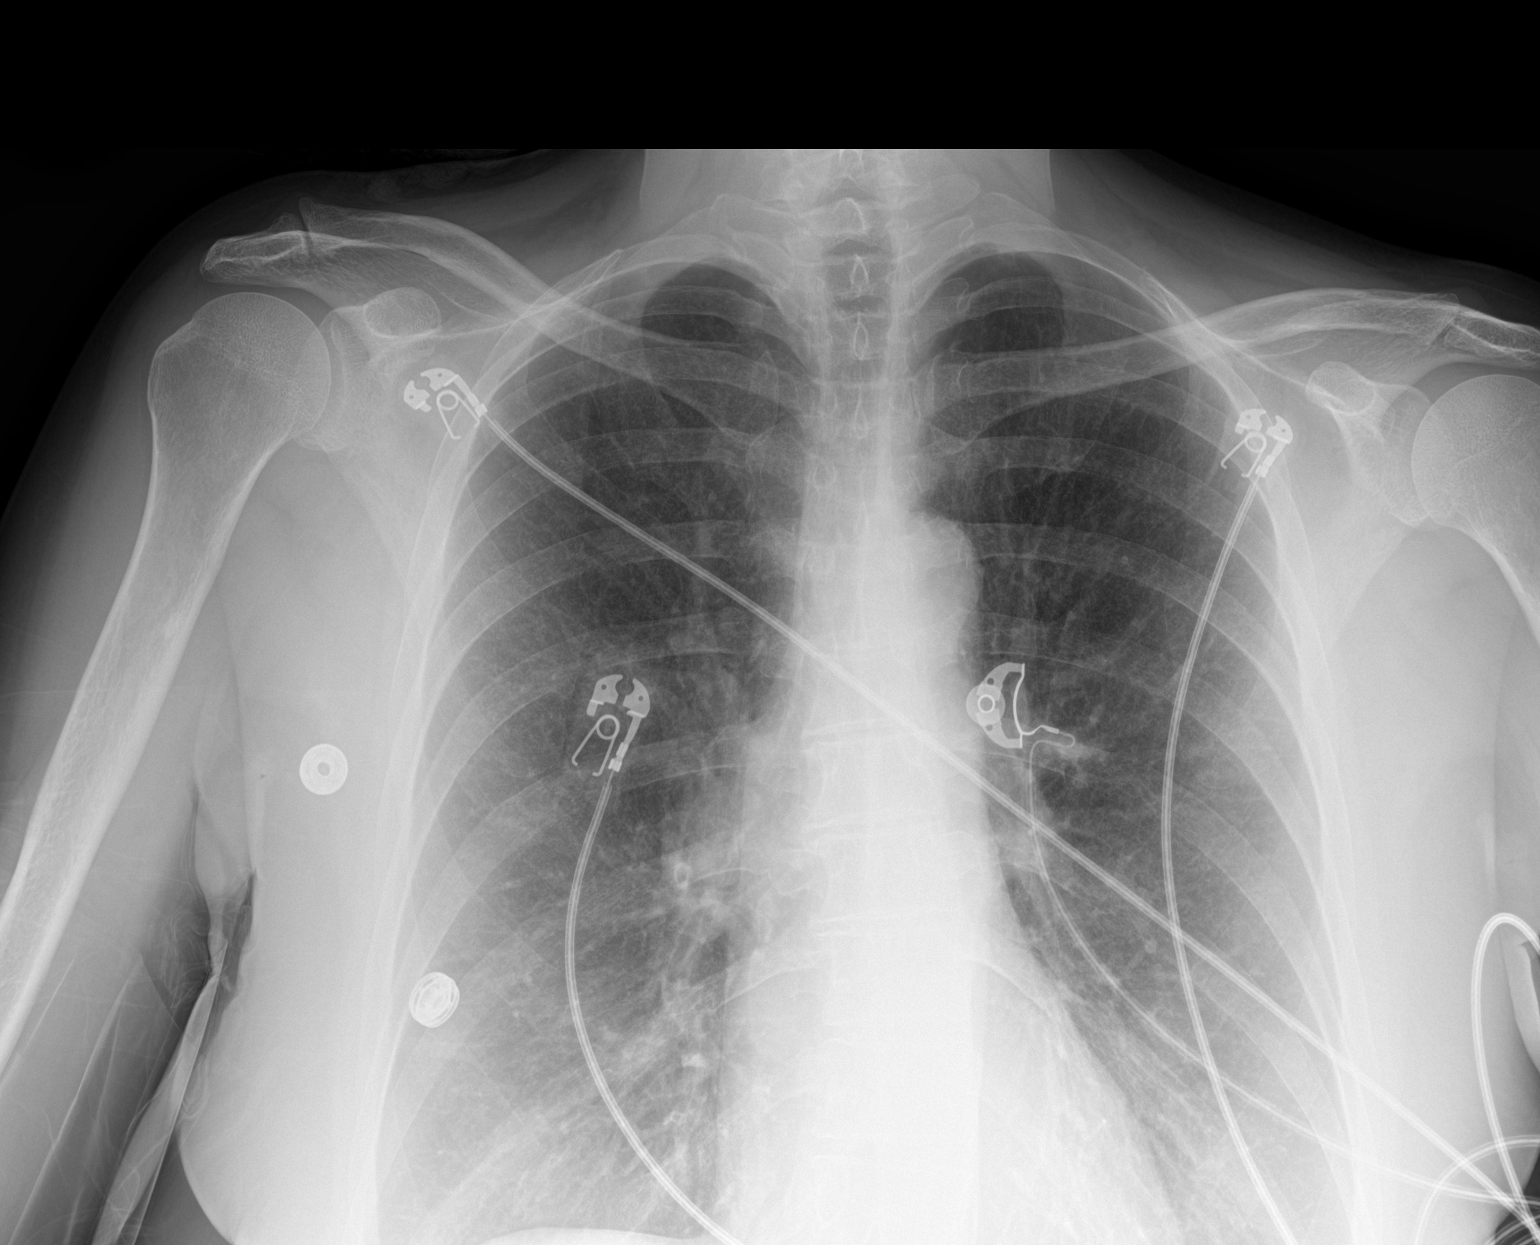

[chest ap (2 of 2)]
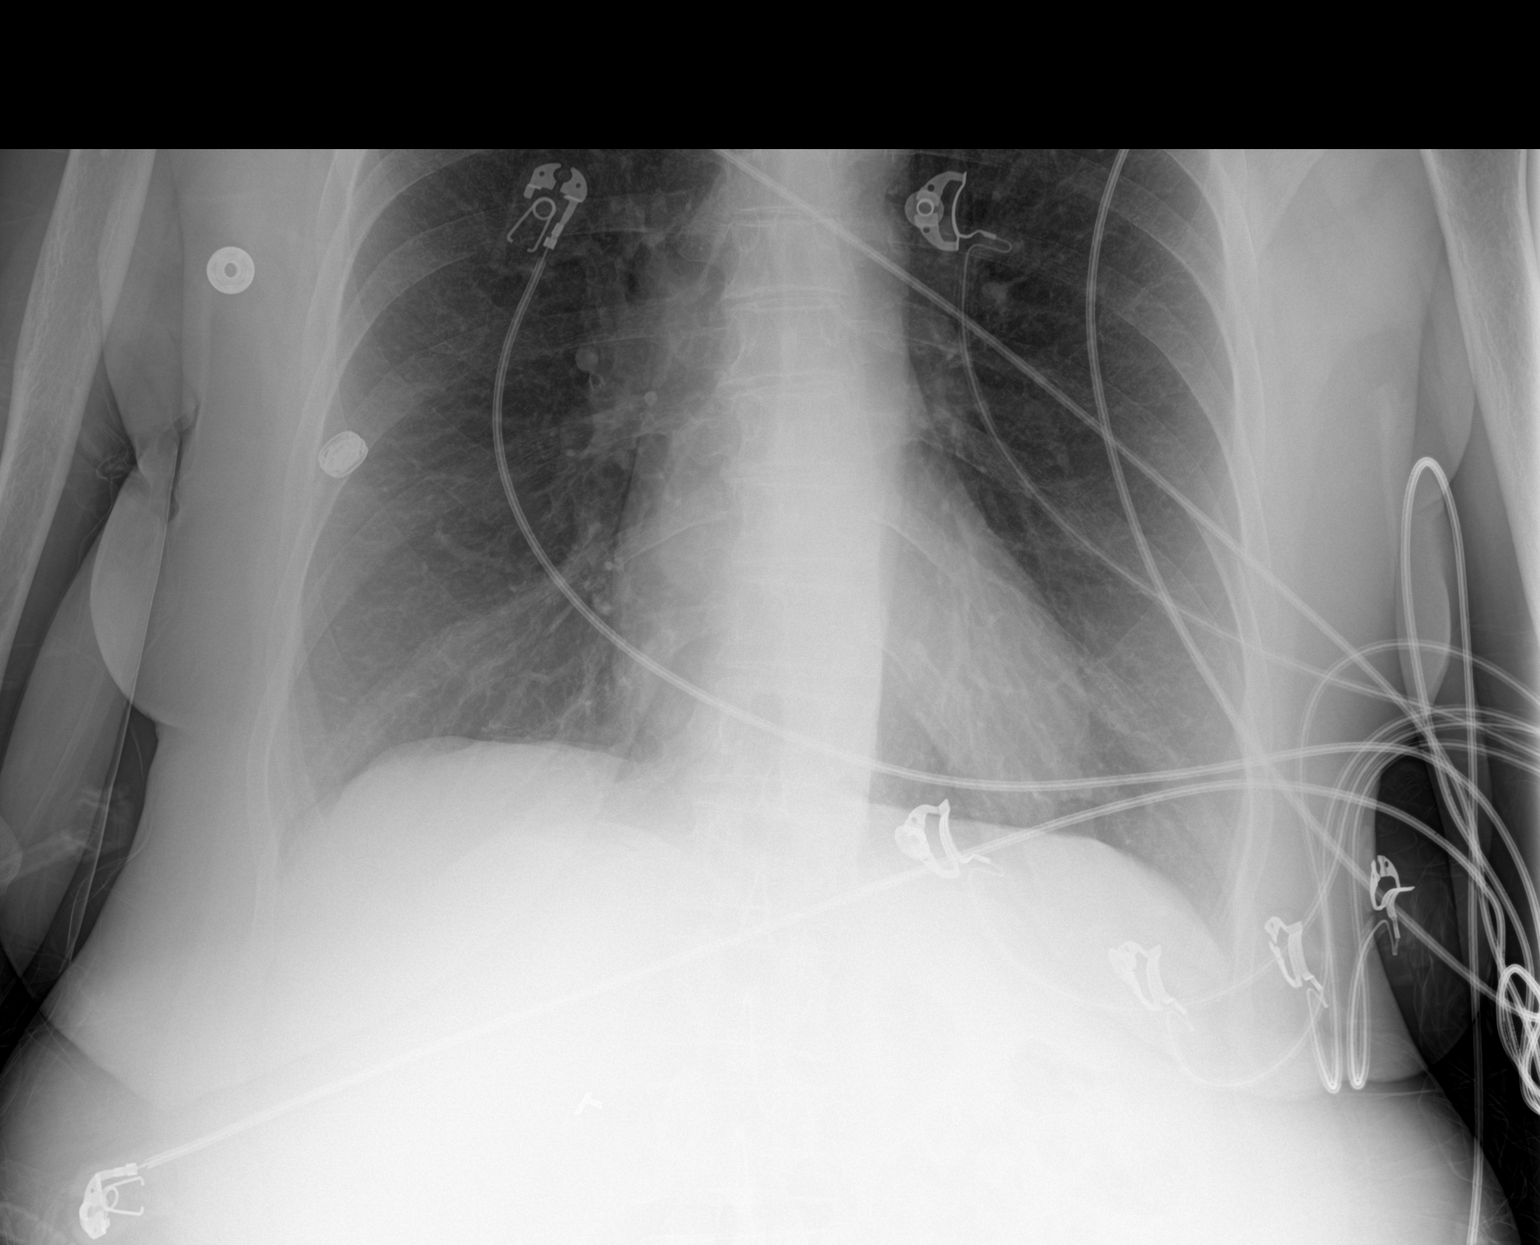

[2 of 2 positions shown; findings below may reference images not displayed]

FINDINGS: 6505 hours. Two views submitted. The heart size and mediastinal
contours are stable. The lungs remain hyperinflated but clear. There
is no pleural effusion or pneumothorax. The bones appear
unremarkable. Telemetry leads overlie the chest.
IMPRESSION: Stable chest.  No acute cardiopulmonary process.

## 2023-07-27 DIAGNOSIS — Z720 Tobacco use: Secondary | ICD-10-CM | POA: Diagnosis not present

## 2023-07-27 DIAGNOSIS — J441 Chronic obstructive pulmonary disease with (acute) exacerbation: Secondary | ICD-10-CM | POA: Diagnosis not present

## 2023-07-27 DIAGNOSIS — F1910 Other psychoactive substance abuse, uncomplicated: Secondary | ICD-10-CM | POA: Diagnosis not present

## 2023-07-27 DIAGNOSIS — J449 Chronic obstructive pulmonary disease, unspecified: Secondary | ICD-10-CM | POA: Diagnosis not present

## 2023-08-27 DIAGNOSIS — J449 Chronic obstructive pulmonary disease, unspecified: Secondary | ICD-10-CM | POA: Diagnosis not present

## 2023-08-27 DIAGNOSIS — F1910 Other psychoactive substance abuse, uncomplicated: Secondary | ICD-10-CM | POA: Diagnosis not present

## 2023-08-27 DIAGNOSIS — Z720 Tobacco use: Secondary | ICD-10-CM | POA: Diagnosis not present

## 2023-08-27 DIAGNOSIS — J441 Chronic obstructive pulmonary disease with (acute) exacerbation: Secondary | ICD-10-CM | POA: Diagnosis not present

## 2023-09-26 DIAGNOSIS — Z720 Tobacco use: Secondary | ICD-10-CM | POA: Diagnosis not present

## 2023-09-26 DIAGNOSIS — F1910 Other psychoactive substance abuse, uncomplicated: Secondary | ICD-10-CM | POA: Diagnosis not present

## 2023-09-26 DIAGNOSIS — J449 Chronic obstructive pulmonary disease, unspecified: Secondary | ICD-10-CM | POA: Diagnosis not present

## 2023-09-26 DIAGNOSIS — J441 Chronic obstructive pulmonary disease with (acute) exacerbation: Secondary | ICD-10-CM | POA: Diagnosis not present

## 2023-10-21 ENCOUNTER — Emergency Department (HOSPITAL_COMMUNITY): Payer: 59

## 2023-10-21 ENCOUNTER — Encounter (HOSPITAL_COMMUNITY): Payer: Self-pay

## 2023-10-21 ENCOUNTER — Emergency Department (HOSPITAL_COMMUNITY)
Admission: EM | Admit: 2023-10-21 | Discharge: 2023-10-22 | Discharge status: Against Medical Advice | Payer: 59 | Source: Home / Self Care | Attending: Emergency Medicine | Admitting: Emergency Medicine

## 2023-10-21 ENCOUNTER — Other Ambulatory Visit: Payer: Self-pay

## 2023-10-21 DIAGNOSIS — J441 Chronic obstructive pulmonary disease with (acute) exacerbation: Secondary | ICD-10-CM | POA: Insufficient documentation

## 2023-10-21 DIAGNOSIS — J4489 Other specified chronic obstructive pulmonary disease: Secondary | ICD-10-CM | POA: Diagnosis not present

## 2023-10-21 DIAGNOSIS — Z5329 Procedure and treatment not carried out because of patient's decision for other reasons: Secondary | ICD-10-CM | POA: Insufficient documentation

## 2023-10-21 DIAGNOSIS — Z1152 Encounter for screening for COVID-19: Secondary | ICD-10-CM | POA: Insufficient documentation

## 2023-10-21 DIAGNOSIS — R0602 Shortness of breath: Secondary | ICD-10-CM | POA: Diagnosis not present

## 2023-10-21 DIAGNOSIS — R0789 Other chest pain: Secondary | ICD-10-CM | POA: Diagnosis not present

## 2023-10-21 DIAGNOSIS — R062 Wheezing: Secondary | ICD-10-CM | POA: Diagnosis not present

## 2023-10-21 LAB — CBC WITH DIFFERENTIAL/PLATELET
Abs Immature Granulocytes: 0.01 10*3/uL (ref 0.00–0.07)
Basophils Absolute: 0.1 10*3/uL (ref 0.0–0.1)
Basophils Relative: 1 %
Eosinophils Absolute: 0.1 10*3/uL (ref 0.0–0.5)
Eosinophils Relative: 2 %
HCT: 51.9 % — ABNORMAL HIGH (ref 36.0–46.0)
Hemoglobin: 16.7 g/dL — ABNORMAL HIGH (ref 12.0–15.0)
Immature Granulocytes: 0 %
Lymphocytes Relative: 20 %
Lymphs Abs: 1.5 10*3/uL (ref 0.7–4.0)
MCH: 30.9 pg (ref 26.0–34.0)
MCHC: 32.2 g/dL (ref 30.0–36.0)
MCV: 96.1 fL (ref 80.0–100.0)
Monocytes Absolute: 0.7 10*3/uL (ref 0.1–1.0)
Monocytes Relative: 10 %
Neutro Abs: 4.9 10*3/uL (ref 1.7–7.7)
Neutrophils Relative %: 67 %
Platelets: 226 10*3/uL (ref 150–400)
RBC: 5.4 MIL/uL — ABNORMAL HIGH (ref 3.87–5.11)
RDW: 12.5 % (ref 11.5–15.5)
WBC: 7.4 10*3/uL (ref 4.0–10.5)
nRBC: 0 % (ref 0.0–0.2)

## 2023-10-21 LAB — BASIC METABOLIC PANEL
Anion gap: 11 (ref 5–15)
BUN: 12 mg/dL (ref 6–20)
CO2: 26 mmol/L (ref 22–32)
Calcium: 8.7 mg/dL — ABNORMAL LOW (ref 8.9–10.3)
Chloride: 101 mmol/L (ref 98–111)
Creatinine, Ser: 0.7 mg/dL (ref 0.44–1.00)
GFR, Estimated: >60 mL/min (ref >60)
Glucose, Bld: 117 mg/dL — ABNORMAL HIGH (ref 70–99)
Potassium: 4.8 mmol/L (ref 3.5–5.1)
Sodium: 138 mmol/L (ref 135–145)

## 2023-10-21 MED ORDER — IPRATROPIUM-ALBUTEROL 0.5-2.5 (3) MG/3ML IN SOLN: 3.0000 mL | Freq: Once | RESPIRATORY_TRACT | Status: DC

## 2023-10-21 MED ORDER — IPRATROPIUM-ALBUTEROL 0.5-2.5 (3) MG/3ML IN SOLN
3.0000 mL | Freq: Once | RESPIRATORY_TRACT | Status: AC
  Administered 2023-10-21: 3 mL via RESPIRATORY_TRACT
  Filled 2023-10-21: qty 3

## 2023-10-21 NOTE — ED Provider Notes (Signed)
[facility] Emergency Department Provider Note MRN:  [id number]  Arrival date & time: 10/22/23     Chief Complaint   Shortness of Breath   History of Present Illness   Linda Foley is a 57 y.o. year-old female presents to the ED with chief complaint of shortness of breath and cough.  She has history of COPD.  She wears 4 to 5 L nasal cannula at baseline.  She reports worsening shortness of breath over the past 2 to 3 days.  She reports productive green sputum with her cough.  She denies fever or chills.  She has had some nausea.  She was given Solu-Medrol and magnesium by EMS as well as 1 nebulizer treatment.  There was no recorded hypoxia with EMS, but she is noted to be 91% in triage, which might be about baseline.  History provided by patient.   Review of Systems  Pertinent positive and negative review of systems noted in HPI.    Physical Exam   Vitals:   10/21/23 2240  BP: (!) 139/102  Pulse: 87  Resp: 20  Temp: 97.7 F (36.5 C)  SpO2: 91%    CONSTITUTIONAL:  chronically ill-appearing, NAD NEURO:  Alert and oriented x 3, CN 3-12 grossly intact EYES:  eyes equal and reactive ENT/NECK:  Supple, no stridor  CARDIO:  normal rate, regular rhythm, appears well-perfused  PULM: There is wheezing bilaterally, increased work of breathing, mild distress GI/GU:  non-distended,  MSK/SPINE:  No gross deformities, no edema, moves all extremities  SKIN:  no rash, atraumatic   *Additional and/or pertinent findings included in MDM below  Diagnostic and Interventional Summary    EKG Interpretation Date/Time:    Ventricular Rate:    PR Interval:    QRS Duration:    QT Interval:    QTC Calculation:   R Axis:      Text Interpretation:         Labs Reviewed  CBC WITH DIFFERENTIAL/PLATELET - Abnormal; Notable for the following components:      Result Value   RBC 5.40 (*)    Hemoglobin 16.7 (*)    HCT 51.9 (*)    All other components within  normal limits  BASIC METABOLIC PANEL - Abnormal; Notable for the following components:   Glucose, Bld 117 (*)    Calcium 8.7 (*)    All other components within normal limits  RESP PANEL BY RT-PCR (RSV, FLU A&B, COVID)  RVPGX2    DG Chest Portable 1 View    (Results Pending)    Medications  ipratropium-albuterol (DUONEB) 0.5-2.5 (3) MG/3ML nebulizer solution 3 mL (has no administration in time range)  ipratropium-albuterol (DUONEB) 0.5-2.5 (3) MG/3ML nebulizer solution 3 mL (3 mLs Nebulization Given 10/21/23 2252)     Procedures  /  Critical Care Procedures  ED Course and Medical Decision Making  I have reviewed the triage vital signs, the nursing notes, and pertinent available records from the EMR.  Social Determinants Affecting Complexity of Care: Patient has no clinically significant social determinants affecting this chief complaint..   ED Course: Clinical Course as of 10/22/23 0143  Wed Oct 22, 2023  0015 Patient feels improved after 1st neb.  States that she hasn't had her 2nd dose yet.  Will reassess after next neb.  CXR still pending. [RB]  0139 Notified by RN that patient left AMA.  I didn't have a chance to talk with the patient prior to her leaving. [RB]  0140 CBC  with Differential(!) No leukocytosis or anemia [RB]  0141 Basic metabolic panel(!) No marked electrolyte abnormality [RB]    Clinical Course User Index [RB] Roxy Horseman, PA-C    Medical Decision Making Patient here with suspected COPD exacerbation.  Has been wheezing and having worsening shortness of breath for the past 2 to 3 days.  Will give additional nebs.  She received magnesium and Solu-Medrol with EMS.  She is on 4 to 5 L nasal cannula at baseline.  Amount and/or Complexity of Data Reviewed Labs: ordered. Radiology: ordered. ECG/medicine tests: ordered.  Risk Prescription drug management.         Consultants: No consultations were needed in caring for this patient.   Treatment  and Plan: Patient eloped prior to me being able to reassess.  RN tells me that she signed out AMA, but I was not notified until after she was already gone.    Final Clinical Impressions(s) / ED Diagnoses     ICD-10-CM   1. COPD exacerbation [facility])  J44.1       ED Discharge Orders     None         Discharge Instructions Discussed with and Provided to Patient:   Discharge Instructions   None      Roxy Horseman, PA-C 10/22/23 0143    Nira Conn, MD 10/22/23 [phone]

## 2023-10-21 NOTE — ED Triage Notes (Signed)
Pt complaining of shortness of breath that started about a week and a half ago with a virus. Today she started wheezing and not being able to catch her breath. Is normally on 4-5 ltrs of o2 at all times.

## 2023-10-22 LAB — RESP PANEL BY RT-PCR (RSV, FLU A&B, COVID)  RVPGX2
Influenza A by PCR: NEGATIVE
Influenza B by PCR: NEGATIVE
Resp Syncytial Virus by PCR: NEGATIVE
SARS Coronavirus 2 by RT PCR: NEGATIVE

## 2023-10-22 NOTE — ED Notes (Signed)
Family at bedside. Patient eating cookies brought in by family. Writer did advise that nurse did not want her to eat until after testing was complete. Patient said she had to eat d/t not eating for two days and she was starving. Nurse made aware.

## 2023-10-22 NOTE — ED Notes (Signed)
Patient came out of room and stated she wanted to leave because she has been waiting a long time. This nurse educated patient on need to stay and recive care, pt refused. AMA forms signed and PA Dahlia Client advised.

## 2023-10-24 ENCOUNTER — Other Ambulatory Visit: Payer: Self-pay

## 2023-10-24 ENCOUNTER — Emergency Department (HOSPITAL_COMMUNITY): Payer: 59

## 2023-10-24 ENCOUNTER — Encounter (HOSPITAL_COMMUNITY): Payer: Self-pay | Admitting: Internal Medicine

## 2023-10-24 ENCOUNTER — Inpatient Hospital Stay (HOSPITAL_COMMUNITY)
Admission: EM | Admit: 2023-10-24 | Discharge: 2023-10-26 | DRG: 190 | Discharge status: Against Medical Advice | Payer: 59 | Attending: Internal Medicine | Admitting: Internal Medicine

## 2023-10-24 DIAGNOSIS — J441 Chronic obstructive pulmonary disease with (acute) exacerbation: Principal | ICD-10-CM | POA: Diagnosis present

## 2023-10-24 DIAGNOSIS — R944 Abnormal results of kidney function studies: Secondary | ICD-10-CM | POA: Diagnosis not present

## 2023-10-24 DIAGNOSIS — Z9851 Tubal ligation status: Secondary | ICD-10-CM

## 2023-10-24 DIAGNOSIS — J9621 Acute and chronic respiratory failure with hypoxia: Secondary | ICD-10-CM | POA: Diagnosis present

## 2023-10-24 DIAGNOSIS — Z7951 Long term (current) use of inhaled steroids: Secondary | ICD-10-CM | POA: Diagnosis not present

## 2023-10-24 DIAGNOSIS — R062 Wheezing: Secondary | ICD-10-CM | POA: Diagnosis not present

## 2023-10-24 DIAGNOSIS — R739 Hyperglycemia, unspecified: Secondary | ICD-10-CM | POA: Diagnosis present

## 2023-10-24 DIAGNOSIS — Z22322 Carrier or suspected carrier of Methicillin resistant Staphylococcus aureus: Secondary | ICD-10-CM | POA: Diagnosis not present

## 2023-10-24 DIAGNOSIS — J9611 Chronic respiratory failure with hypoxia: Secondary | ICD-10-CM | POA: Diagnosis present

## 2023-10-24 DIAGNOSIS — E8809 Other disorders of plasma-protein metabolism, not elsewhere classified: Secondary | ICD-10-CM | POA: Diagnosis not present

## 2023-10-24 DIAGNOSIS — Z1152 Encounter for screening for COVID-19: Secondary | ICD-10-CM | POA: Diagnosis not present

## 2023-10-24 DIAGNOSIS — Z880 Allergy status to penicillin: Secondary | ICD-10-CM

## 2023-10-24 DIAGNOSIS — Z79899 Other long term (current) drug therapy: Secondary | ICD-10-CM

## 2023-10-24 DIAGNOSIS — Z9049 Acquired absence of other specified parts of digestive tract: Secondary | ICD-10-CM | POA: Diagnosis not present

## 2023-10-24 DIAGNOSIS — K219 Gastro-esophageal reflux disease without esophagitis: Secondary | ICD-10-CM | POA: Diagnosis not present

## 2023-10-24 DIAGNOSIS — F17210 Nicotine dependence, cigarettes, uncomplicated: Secondary | ICD-10-CM | POA: Diagnosis not present

## 2023-10-24 DIAGNOSIS — Z720 Tobacco use: Secondary | ICD-10-CM | POA: Diagnosis present

## 2023-10-24 DIAGNOSIS — Z743 Need for continuous supervision: Secondary | ICD-10-CM | POA: Diagnosis not present

## 2023-10-24 DIAGNOSIS — D751 Secondary polycythemia: Secondary | ICD-10-CM | POA: Diagnosis not present

## 2023-10-24 DIAGNOSIS — R32 Unspecified urinary incontinence: Secondary | ICD-10-CM | POA: Diagnosis present

## 2023-10-24 DIAGNOSIS — R000 Tachycardia, unspecified: Secondary | ICD-10-CM | POA: Diagnosis not present

## 2023-10-24 DIAGNOSIS — R0689 Other abnormalities of breathing: Secondary | ICD-10-CM | POA: Diagnosis not present

## 2023-10-24 DIAGNOSIS — J439 Emphysema, unspecified: Secondary | ICD-10-CM | POA: Diagnosis not present

## 2023-10-24 DIAGNOSIS — R0902 Hypoxemia: Secondary | ICD-10-CM | POA: Diagnosis not present

## 2023-10-24 DIAGNOSIS — Z833 Family history of diabetes mellitus: Secondary | ICD-10-CM | POA: Diagnosis not present

## 2023-10-24 DIAGNOSIS — Z9981 Dependence on supplemental oxygen: Secondary | ICD-10-CM

## 2023-10-24 DIAGNOSIS — D72829 Elevated white blood cell count, unspecified: Secondary | ICD-10-CM | POA: Diagnosis not present

## 2023-10-24 DIAGNOSIS — K5900 Constipation, unspecified: Secondary | ICD-10-CM | POA: Diagnosis present

## 2023-10-24 DIAGNOSIS — R0602 Shortness of breath: Secondary | ICD-10-CM | POA: Diagnosis not present

## 2023-10-24 LAB — RESP PANEL BY RT-PCR (RSV, FLU A&B, COVID)  RVPGX2
Influenza A by PCR: NEGATIVE
Influenza B by PCR: NEGATIVE
Resp Syncytial Virus by PCR: NEGATIVE
SARS Coronavirus 2 by RT PCR: NEGATIVE

## 2023-10-24 LAB — BASIC METABOLIC PANEL
Anion gap: 7 (ref 5–15)
BUN: 15 mg/dL (ref 6–20)
CO2: 31 mmol/L (ref 22–32)
Calcium: 8.7 mg/dL — ABNORMAL LOW (ref 8.9–10.3)
Chloride: 100 mmol/L (ref 98–111)
Creatinine, Ser: 0.71 mg/dL (ref 0.44–1.00)
GFR, Estimated: >60 mL/min (ref >60)
Glucose, Bld: 124 mg/dL — ABNORMAL HIGH (ref 70–99)
Potassium: 3.9 mmol/L (ref 3.5–5.1)
Sodium: 138 mmol/L (ref 135–145)

## 2023-10-24 LAB — BLOOD GAS, VENOUS
Acid-Base Excess: 3.3 mmol/L — ABNORMAL HIGH (ref 0.0–2.0)
Bicarbonate: 29.6 mmol/L — ABNORMAL HIGH (ref 20.0–28.0)
O2 Saturation: 100 %
Patient temperature: 37
pCO2, Ven: 50 mm[Hg] (ref 44–60)
pH, Ven: 7.38 (ref 7.25–7.43)
pO2, Ven: 185 mm[Hg] — ABNORMAL HIGH (ref 32–45)

## 2023-10-24 LAB — CBC WITH DIFFERENTIAL/PLATELET
Abs Immature Granulocytes: 0.03 10*3/uL (ref 0.00–0.07)
Basophils Absolute: 0.1 10*3/uL (ref 0.0–0.1)
Basophils Relative: 1 %
Eosinophils Absolute: 0.3 10*3/uL (ref 0.0–0.5)
Eosinophils Relative: 3 %
HCT: 52.5 % — ABNORMAL HIGH (ref 36.0–46.0)
Hemoglobin: 16.9 g/dL — ABNORMAL HIGH (ref 12.0–15.0)
Immature Granulocytes: 0 %
Lymphocytes Relative: 14 %
Lymphs Abs: 1.3 10*3/uL (ref 0.7–4.0)
MCH: 31.5 pg (ref 26.0–34.0)
MCHC: 32.2 g/dL (ref 30.0–36.0)
MCV: 97.9 fL (ref 80.0–100.0)
Monocytes Absolute: 1 10*3/uL (ref 0.1–1.0)
Monocytes Relative: 11 %
Neutro Abs: 6.7 10*3/uL (ref 1.7–7.7)
Neutrophils Relative %: 71 %
Platelets: 272 10*3/uL (ref 150–400)
RBC: 5.36 MIL/uL — ABNORMAL HIGH (ref 3.87–5.11)
RDW: 12.8 % (ref 11.5–15.5)
WBC: 9.4 10*3/uL (ref 4.0–10.5)
nRBC: 0 % (ref 0.0–0.2)

## 2023-10-24 LAB — MRSA NEXT GEN BY PCR, NASAL: MRSA by PCR Next Gen: DETECTED — AB

## 2023-10-24 MED ORDER — MAGNESIUM SULFATE 2 GM/50ML IV SOLN
2.0000 g | Freq: Once | INTRAVENOUS | Status: AC
  Administered 2023-10-24: 2 g via INTRAVENOUS
  Filled 2023-10-24: qty 50

## 2023-10-24 MED ORDER — ALBUTEROL SULFATE (2.5 MG/3ML) 0.083% IN NEBU
10.0000 mg/h | INHALATION_SOLUTION | Freq: Once | RESPIRATORY_TRACT | Status: AC
  Administered 2023-10-24: 10 mg/h via RESPIRATORY_TRACT
  Filled 2023-10-24: qty 3

## 2023-10-24 MED ORDER — MUPIROCIN 2 % EX OINT
1.0000 | TOPICAL_OINTMENT | Freq: Two times a day (BID) | CUTANEOUS | Status: DC
Start: 2023-10-24 — End: 2023-10-26
  Administered 2023-10-24 – 2023-10-26 (×5): 1 via NASAL
  Filled 2023-10-24: qty 22

## 2023-10-24 MED ORDER — ENOXAPARIN SODIUM 30 MG/0.3ML IJ SOSY
30.0000 mg | PREFILLED_SYRINGE | INTRAMUSCULAR | Status: DC
  Administered 2023-10-24 – 2023-10-26 (×3): 30 mg via SUBCUTANEOUS
  Filled 2023-10-24 (×3): qty 0.3

## 2023-10-24 MED ORDER — METHYLPREDNISOLONE SODIUM SUCC 125 MG IJ SOLR
125.0000 mg | Freq: Once | INTRAMUSCULAR | Status: AC
  Administered 2023-10-24: 125 mg via INTRAVENOUS
  Filled 2023-10-24: qty 2

## 2023-10-24 MED ORDER — PROCHLORPERAZINE EDISYLATE 10 MG/2ML IJ SOLN
5.0000 mg | INTRAMUSCULAR | Status: DC | PRN
  Administered 2023-10-25 – 2023-10-26 (×2): 5 mg via INTRAVENOUS
  Filled 2023-10-24 (×2): qty 2

## 2023-10-24 MED ORDER — ACETAMINOPHEN 325 MG PO TABS
650.0000 mg | ORAL_TABLET | Freq: Four times a day (QID) | ORAL | Status: DC | PRN
  Administered 2023-10-25: 650 mg via ORAL
  Filled 2023-10-24: qty 2

## 2023-10-24 MED ORDER — CHLORHEXIDINE GLUCONATE CLOTH 2 % EX PADS
6.0000 | MEDICATED_PAD | Freq: Every day | CUTANEOUS | Status: DC
  Administered 2023-10-24 – 2023-10-26 (×3): 6 via TOPICAL

## 2023-10-24 MED ORDER — IPRATROPIUM-ALBUTEROL 0.5-2.5 (3) MG/3ML IN SOLN
3.0000 mL | Freq: Three times a day (TID) | RESPIRATORY_TRACT | Status: DC
  Administered 2023-10-24 – 2023-10-25 (×2): 3 mL via RESPIRATORY_TRACT
  Filled 2023-10-24: qty 3

## 2023-10-24 MED ORDER — METHYLPREDNISOLONE SODIUM SUCC 40 MG IJ SOLR: 40.0000 mg | Freq: Every day | INTRAMUSCULAR | Status: DC

## 2023-10-24 MED ORDER — PREDNISONE 20 MG PO TABS
40.0000 mg | ORAL_TABLET | Freq: Every day | ORAL | Status: DC
  Administered 2023-10-24 – 2023-10-26 (×3): 40 mg via ORAL
  Filled 2023-10-24 (×3): qty 2

## 2023-10-24 MED ORDER — IPRATROPIUM-ALBUTEROL 0.5-2.5 (3) MG/3ML IN SOLN: 3.0000 mL | RESPIRATORY_TRACT | Status: DC | PRN

## 2023-10-24 MED ORDER — DEXTROSE 5 % IV SOLN
500.0000 mg | INTRAVENOUS | Status: DC
  Administered 2023-10-24: 500 mg via INTRAVENOUS
  Filled 2023-10-24: qty 5

## 2023-10-24 MED ORDER — IPRATROPIUM-ALBUTEROL 0.5-2.5 (3) MG/3ML IN SOLN: 3.0000 mL | Freq: Four times a day (QID) | RESPIRATORY_TRACT | Status: DC

## 2023-10-24 MED ORDER — PNEUMOCOCCAL 20-VAL CONJ VACC 0.5 ML IM SUSY
0.5000 mL | PREFILLED_SYRINGE | INTRAMUSCULAR | Status: DC
  Filled 2023-10-24: qty 0.5

## 2023-10-24 MED ORDER — IPRATROPIUM-ALBUTEROL 0.5-2.5 (3) MG/3ML IN SOLN
3.0000 mL | Freq: Four times a day (QID) | RESPIRATORY_TRACT | Status: DC
  Administered 2023-10-24: 3 mL via RESPIRATORY_TRACT
  Filled 2023-10-24: qty 3

## 2023-10-24 MED ORDER — AZITHROMYCIN 250 MG PO TABS
250.0000 mg | ORAL_TABLET | Freq: Every day | ORAL | Status: DC
  Administered 2023-10-25 – 2023-10-26 (×2): 250 mg via ORAL
  Filled 2023-10-24 (×2): qty 1

## 2023-10-24 MED ORDER — ALBUTEROL SULFATE (2.5 MG/3ML) 0.083% IN NEBU
2.5000 mg | INHALATION_SOLUTION | RESPIRATORY_TRACT | Status: DC | PRN
  Administered 2023-10-24: 2.5 mg via RESPIRATORY_TRACT
  Filled 2023-10-24: qty 3

## 2023-10-24 MED ORDER — ACETAMINOPHEN 650 MG RE SUPP: 650.0000 mg | Freq: Four times a day (QID) | RECTAL | Status: DC | PRN

## 2023-10-24 MED ORDER — ONDANSETRON HCL 4 MG/2ML IJ SOLN
4.0000 mg | Freq: Once | INTRAMUSCULAR | Status: AC
  Administered 2023-10-24: 4 mg via INTRAVENOUS
  Filled 2023-10-24: qty 2

## 2023-10-24 MED ORDER — LORAZEPAM 2 MG/ML IJ SOLN
0.5000 mg | Freq: Once | INTRAMUSCULAR | Status: AC
  Administered 2023-10-24: 0.5 mg via INTRAVENOUS
  Filled 2023-10-24: qty 1

## 2023-10-24 MED ORDER — ENSURE ENLIVE PO LIQD
237.0000 mL | Freq: Two times a day (BID) | ORAL | Status: DC
  Administered 2023-10-24 – 2023-10-26 (×4): 237 mL via ORAL

## 2023-10-24 MED ORDER — NICOTINE 14 MG/24HR TD PT24
14.0000 mg | MEDICATED_PATCH | Freq: Every day | TRANSDERMAL | Status: DC
  Administered 2023-10-24 – 2023-10-26 (×3): 14 mg via TRANSDERMAL
  Filled 2023-10-24 (×3): qty 1

## 2023-10-24 MED ORDER — GUAIFENESIN ER 600 MG PO TB12
600.0000 mg | ORAL_TABLET | Freq: Two times a day (BID) | ORAL | Status: DC
  Administered 2023-10-24 – 2023-10-25 (×3): 600 mg via ORAL
  Filled 2023-10-24 (×3): qty 1

## 2023-10-24 MED ORDER — MELATONIN 5 MG PO TABS
5.0000 mg | ORAL_TABLET | Freq: Every evening | ORAL | Status: DC | PRN
  Administered 2023-10-25 (×2): 5 mg via ORAL
  Filled 2023-10-24 (×2): qty 1

## 2023-10-24 MED ORDER — INFLUENZA VIRUS VACC SPLIT PF (FLUZONE) 0.5 ML IM SUSY
0.5000 mL | PREFILLED_SYRINGE | INTRAMUSCULAR | Status: DC
  Filled 2023-10-24: qty 0.5

## 2023-10-24 NOTE — Progress Notes (Signed)
Chaplain received a consult that Linda Foley wanted to create/update her advance directives.  Linda Foley was confused and did not remember asking for this.  She was also very tired and stated that she wanted to rest for now.  If needs arise, please page [place] at [phone].

## 2023-10-24 NOTE — TOC Initial Note (Signed)
Transition of Care [facility]) - Initial/Assessment Note   Patient Details  Name: Linda Foley MRN: [id number] Date of Birth: [birth year]  Transition of Care [facility]) CM/SW Contact:    Ewing Schlein, LCSW Phone Number: 10/24/2023, 11:34 AM  Clinical Narrative: Patient is from a hotel and is on 5L/min home oxygen at baseline. Patient reported she wants to get a portable concentrator for her oxygen if possible and she will need a travel tank at discharge as she is on her last tank. CSW confirmed with Zack from [employer] that patient is active with the company for her home oxygen. Per Zack, patient will have to be evaluated at an [employer] location for a portable concentrator after discharge. CSW requested POC evaluation order. TOC to follow.  Expected Discharge Plan: Home/Self Care Barriers to Discharge: Continued Medical Work up  Patient Goals and CMS Choice Patient states their goals for this hospitalization and ongoing recovery are:: Get portable oxygen concentrator if possible Choice offered to / list presented to : NA  Expected Discharge Plan and Services In-house Referral: Clinical Social Work Living arrangements for the past 2 months: Hotel/Motel            DME Arranged: Oxygen DME Agency: [employer] Date DME Agency Contacted: 10/24/23 Time DME Agency Contacted: 1101 Representative spoke with at DME Agency: Zack  Prior Living Arrangements/Services Living arrangements for the past 2 months: Hotel/Motel Lives with:: Self Patient language and need for interpreter reviewed:: Yes Do you feel safe going back to the place where you live?: Yes      Need for Family Participation in Patient Care: No (Comment) Care giver support system in place?: Yes (comment) Current home services: DME (Patient is on 5L/min home oxygen through [employer].) Criminal Activity/Legal Involvement Pertinent to Current Situation/Hospitalization: No - Comment as needed  Emotional Assessment Appearance:: Appears older than  stated age Attitude/Demeanor/Rapport: Engaged Affect (typically observed): Accepting Orientation: : Oriented to Self, Oriented to Place, Oriented to  Time, Oriented to Situation Alcohol / Substance Use: Tobacco Use Psych Involvement: No (comment)  Admission diagnosis:  COPD exacerbation (HCC) [J44.1] COPD with acute exacerbation (HCC) [J44.1] Patient Active Problem List   Diagnosis Date Noted   COPD exacerbation (HCC) 02/21/2022   Tobacco abuse 02/21/2022   COPD with acute exacerbation (HCC) 06/20/2018   Polysubstance abuse (HCC) 06/20/2018   PCP:  Oneita Hurt No Pharmacy:   [facility] [address] [place] - [address] [place] [address] Phone: [phone] Fax: [phone]  Social Determinants of Health (SDOH) Social History: SDOH Screenings   Tobacco Use: High Risk (10/21/2023)   SDOH Interventions:    Readmission Risk Interventions     No data to display

## 2023-10-24 NOTE — ED Triage Notes (Signed)
From [facility] room for SOB. Fire rescue initial 02 sat 70%. Pt has removed neb tx with EMS. 100% on Non rebreather

## 2023-10-24 NOTE — ED Notes (Signed)
ED TO INPATIENT HANDOFF REPORT  Name/Age/Gender Linda Foley 57 y.o. female  Code Status Code Status History     Date Active Date Inactive Code Status Order ID Comments User Context   02/21/2022 1046 02/23/2022 2008 Full Code [id number]  Teddy Spike, DO Inpatient   06/20/2018 0403 06/21/2018 1521 Full Code [id number]  Hillary Bow, DO Inpatient       Home/SNF/Other Home  Chief Complaint COPD with acute exacerbation [facility] [J44.1]  Level of Care/Admitting Diagnosis ED Disposition     ED Disposition  Admit   Condition  --   Comment  Hospital Area: [facility] [[id number]]  Level of Care: Stepdown [14]  Admit to SDU based on following criteria: Respiratory Distress:  Frequent assessment and/or intervention to maintain adequate ventilation/respiration, pulmonary toilet, and respiratory treatment.  May admit patient to [facility] or [facility] if equivalent level of care is available:: Yes  Covid Evaluation: Asymptomatic - no recent exposure (last 10 days) testing not required  Diagnosis: COPD with acute exacerbation [facility]) [[id number]]  Admitting Physician: Darlin Drop [[id number]]  Attending Physician: Darlin Drop [[id number]]  Certification:: I certify this patient will need inpatient services for at least 2 midnights  Expected Medical Readiness: 10/26/2023          Medical History Past Medical History:  Diagnosis Date   Anxiety    COPD (chronic obstructive pulmonary disease) (HCC)    Polysubstance abuse (HCC)     Allergies Allergies  Allergen Reactions   Penicillins Nausea And Vomiting and Other (See Comments)    Has patient had a PCN reaction causing immediate rash, facial/tongue/throat swelling, SOB or lightheadedness with hypotension: no Has patient had a PCN reaction causing severe rash involving mucus membranes or skin necrosis: no Has patient had a PCN reaction that required hospitalization no Has patient had a PCN reaction occurring  within the last 10 years: unknown If all of the above answers are "NO", then may proceed with Cephalosporin use.     IV Location/Drains/Wounds Patient Lines/Drains/Airways Status     Active Line/Drains/Airways     Name Placement date Placement time Site Days   Peripheral IV 10/21/23 20 G 1" Left;Posterior Forearm 10/21/23  1015  Forearm  3            Labs/Imaging Results for orders placed or performed during the hospital encounter of 10/24/23 (from the past 48 hour(s))  CBC with Differential     Status: Abnormal   Collection Time: 10/24/23  1:27 AM  Result Value Ref Range   WBC 9.4 4.0 - 10.5 K/uL   RBC 5.36 (H) 3.87 - 5.11 MIL/uL   Hemoglobin 16.9 (H) 12.0 - 15.0 g/dL   HCT 16.0 (H) 10.9 - 32.3 %   MCV 97.9 80.0 - 100.0 fL   MCH 31.5 26.0 - 34.0 pg   MCHC 32.2 30.0 - 36.0 g/dL   RDW 55.7 32.2 - 02.5 %   Platelets 272 150 - 400 K/uL   nRBC 0.0 0.0 - 0.2 %   Neutrophils Relative % 71 %   Neutro Abs 6.7 1.7 - 7.7 K/uL   Lymphocytes Relative 14 %   Lymphs Abs 1.3 0.7 - 4.0 K/uL   Monocytes Relative 11 %   Monocytes Absolute 1.0 0.1 - 1.0 K/uL   Eosinophils Relative 3 %   Eosinophils Absolute 0.3 0.0 - 0.5 K/uL   Basophils Relative 1 %   Basophils Absolute 0.1 0.0 - 0.1 K/uL  Immature Granulocytes 0 %   Abs Immature Granulocytes 0.03 0.00 - 0.07 K/uL    Comment: Performed at [facility], [address], [place], [place] [address]  Basic metabolic panel     Status: Abnormal   Collection Time: 10/24/23  1:27 AM  Result Value Ref Range   Sodium 138 135 - 145 mmol/L   Potassium 3.9 3.5 - 5.1 mmol/L   Chloride 100 98 - 111 mmol/L   CO2 31 22 - 32 mmol/L   Glucose, Bld 124 (H) 70 - 99 mg/dL    Comment: Glucose reference range applies only to samples taken after fasting for at least 8 hours.   BUN 15 6 - 20 mg/dL   Creatinine, Ser 9.14 0.44 - 1.00 mg/dL   Calcium 8.7 (L) 8.9 - 10.3 mg/dL   GFR, Estimated >78 >29 mL/min    Comment:  (NOTE) Calculated using the CKD-EPI Creatinine Equation (2021)    Anion gap 7 5 - 15    Comment: Performed at [facility], [address] [place], [place] [address]  Resp panel by RT-PCR (RSV, Flu A&B, Covid) Anterior Nasal Swab     Status: None   Collection Time: 10/24/23  1:27 AM   Specimen: Anterior Nasal Swab  Result Value Ref Range   SARS Coronavirus 2 by RT PCR NEGATIVE NEGATIVE    Comment: (NOTE) SARS-CoV-2 target nucleic acids are NOT DETECTED.  The SARS-CoV-2 RNA is generally detectable in upper respiratory specimens during the acute phase of infection. The lowest concentration of SARS-CoV-2 viral copies this assay can detect is 138 copies/mL. A negative result does not preclude SARS-Cov-2 infection and should not be used as the sole basis for treatment or other patient management decisions. A negative result may occur with  improper specimen collection/handling, submission of specimen other than nasopharyngeal swab, presence of viral mutation(s) within the areas targeted by this assay, and inadequate number of viral copies(<138 copies/mL). A negative result must be combined with clinical observations, patient history, and epidemiological information. The expected result is Negative.  Fact Sheet for Patients:  BloggerCourse.com  Fact Sheet for Healthcare Providers:  SeriousBroker.it  This test is no t yet approved or cleared by the [place] FDA and  has been authorized for detection and/or diagnosis of SARS-CoV-2 by FDA under an Emergency Use Authorization (EUA). This EUA will remain  in effect (meaning this test can be used) for the duration of the COVID-19 declaration under Section 564(b)(1) of the Act, 21 U.S.C.section 360bbb-3(b)(1), unless the authorization is terminated  or revoked sooner.       Influenza A by PCR NEGATIVE NEGATIVE   Influenza B by PCR NEGATIVE NEGATIVE    Comment:  (NOTE) The Xpert Xpress SARS-CoV-2/FLU/RSV plus assay is intended as an aid in the diagnosis of influenza from Nasopharyngeal swab specimens and should not be used as a sole basis for treatment. Nasal washings and aspirates are unacceptable for Xpert Xpress SARS-CoV-2/FLU/RSV testing.  Fact Sheet for Patients: BloggerCourse.com  Fact Sheet for Healthcare Providers: SeriousBroker.it  This test is not yet approved or cleared by the [place] FDA and has been authorized for detection and/or diagnosis of SARS-CoV-2 by FDA under an Emergency Use Authorization (EUA). This EUA will remain in effect (meaning this test can be used) for the duration of the COVID-19 declaration under Section 564(b)(1) of the Act, 21 U.S.C. section 360bbb-3(b)(1), unless the authorization is terminated or revoked.     Resp Syncytial Virus by PCR  NEGATIVE NEGATIVE    Comment: (NOTE) Fact Sheet for Patients: BloggerCourse.com  Fact Sheet for Healthcare Providers: SeriousBroker.it  This test is not yet approved or cleared by the [place] FDA and has been authorized for detection and/or diagnosis of SARS-CoV-2 by FDA under an Emergency Use Authorization (EUA). This EUA will remain in effect (meaning this test can be used) for the duration of the COVID-19 declaration under Section 564(b)(1) of the Act, 21 U.S.C. section 360bbb-3(b)(1), unless the authorization is terminated or revoked.  Performed at [facility], [address], [place], [place] [address]   Blood gas, venous     Status: Abnormal   Collection Time: 10/24/23  3:15 AM  Result Value Ref Range   pH, Ven 7.38 7.25 - 7.43   pCO2, Ven 50 44 - 60 mmHg   pO2, Ven 185 (H) 32 - 45 mmHg   Bicarbonate 29.6 (H) 20.0 - 28.0 mmol/L   Acid-Base Excess 3.3 (H) 0.0 - 2.0 mmol/L   O2 Saturation 100 %   Patient temperature 37.0      Comment: Performed at [facility], [address] [place], [place] [address]   DG Chest Port 1 View  Result Date: 10/24/2023 CLINICAL DATA:  COPD and shortness of breath EXAM: PORTABLE CHEST 1 VIEW COMPARISON:  10/21/2023 FINDINGS: Hyperinflation. No focal consolidation, pleural effusion, or pneumothorax. No displaced rib fractures. Stable cardiomediastinal silhouette. IMPRESSION: No acute process.  Emphysema. Electronically Signed   By: Minerva Fester M.D.   On: 10/24/2023 03:26    Pending Labs Unresulted Labs (From admission, onward)    None       Vitals/Pain Today's Vitals   10/24/23 0059 10/24/23 0233 10/24/23 0333  BP: (!) 170/124    Pulse: 100  73  Resp: 20  16  Temp: 98.4 F (36.9 C)    TempSrc: Oral    SpO2: 98%  98%  PainSc:  0-No pain     Isolation Precautions No active isolations  Medications Medications  ipratropium-albuterol (DUONEB) 0.5-2.5 (3) MG/3ML nebulizer solution 3 mL (has no administration in time range)  methylPREDNISolone sodium succinate (SOLU-MEDROL) 40 mg/mL injection 40 mg (has no administration in time range)  azithromycin (ZITHROMAX) 500 mg in dextrose 5 % 250 mL IVPB (has no administration in time range)  ipratropium-albuterol (DUONEB) 0.5-2.5 (3) MG/3ML nebulizer solution 3 mL (has no administration in time range)  methylPREDNISolone sodium succinate (SOLU-MEDROL) 125 mg/2 mL injection 125 mg (125 mg Intravenous Given 10/24/23 0132)  magnesium sulfate IVPB 2 g 50 mL (0 g Intravenous Stopped 10/24/23 0234)  albuterol (PROVENTIL) (2.5 MG/3ML) 0.083% nebulizer solution (10 mg/hr Nebulization Given 10/24/23 0132)  ondansetron (ZOFRAN) injection 4 mg (4 mg Intravenous Given 10/24/23 0204)  LORazepam (ATIVAN) injection 0.5 mg (0.5 mg Intravenous Given 10/24/23 0215)    Mobility non-ambulatory

## 2023-10-24 NOTE — Progress Notes (Signed)
   10/24/23 0333  BiPAP/CPAP/SIPAP  BiPAP/CPAP/SIPAP Pt Type Adult  BiPAP/CPAP/SIPAP V60  Mask Type Full face mask  Mask Size Small  Set Rate 8 breaths/min  IPAP 12 cmH20  EPAP 6 cmH2O  FiO2 (%) 40 %  Minute Ventilation 8.9  Leak 9  Peak Inspiratory Pressure (PIP) 12  Tidal Volume (Vt) 614  Patient Home Equipment No  Auto Titrate No  Press High Alarm 25 cmH2O  Press Low Alarm 5 cmH2O  CPAP/SIPAP surface wiped down Yes  BiPAP/CPAP /SiPAP Vitals  Pulse Rate 73  Resp 16  SpO2 98 %  Bilateral Breath Sounds Diminished  MEWS Score/Color  MEWS Score 0  MEWS Score Color Green   Adjustments made to settings due to volumes being greater than 1.5L per breath.

## 2023-10-24 NOTE — Plan of Care (Signed)

## 2023-10-24 NOTE — H&P (Addendum)
History and Physical    Patient: Linda Foley LKG:[id number] DOB: [birth year] DOA: 10/24/2023 DOS: the patient was seen and examined on 10/24/2023 PCP: Pcp, No  Patient coming from: Home  Chief Complaint:  Chief Complaint  Patient presents with   Shortness of Breath   HPI: Linda Foley is a 57 y.o. female with medical history significant of anxiety, polysubstance abuse, tobacco use, COPD, chronic respiratory failure on 4 LPM via [place] who presented to the emergency department complaints of progressively worsening dyspnea associated with wheezing.  EMS reported that her O2 sats were 70% on her baseline 4 LPM oxygen.  She was seen at [facility] on 10/21/2023 and left AMA.  She is also refused nebulizer treatments, methylprednisolone, magnesium or IV start by EMS.  She denied fever, chills,  sore throat, wheezing or hemoptysis but has had some rhinorrhea with oxygen use.  No chest pain, palpitations, diaphoresis, PND, orthopnea or pitting edema of the lower extremities.  No abdominal pain, nausea, emesis, diarrhea, constipation, melena or hematochezia.  No flank pain, dysuria or hematuria, feels like she is incontinent frequently.  No polyuria, polydipsia, polyphagia or blurred vision.   Lab work: CBC showed white count of 9.4, hemoglobin 16.9 g/dL and platelets 664.  Negative coronavirus, influenza and RSV PCR.  Venous blood gas showed normal pH, pCO2 of 50 and pO2 of 185 mmHg, HCO3 29.6 and acid-base status is 3.3 mmol/L.  BMP showed a glucose of 124 and calcium 8.7 mg/dL, the rest of the electrolytes and renal function were normal.  Imaging: Portable 1 view chest radiograph with emphysema, but no acute process.   ED course: Initial vital signs were temperature 98.4 F, pulse 100 respiration 20, BP 170/124 mmHg O2 sat 98% on nasal cannula oxygen at 15 L/min.  She received lorazepam 0.5 mg IVP, magnesium sulfate 2 g IVPB, methylprednisolone 125 mg IVP, ondansetron 4 mg IVP and a 10 mg  continuous albuterol neb.  Review of Systems: As mentioned in the history of present illness. All other systems reviewed and are negative. Past Medical History:  Diagnosis Date   Anxiety    COPD (chronic obstructive pulmonary disease) (HCC)    Polysubstance abuse (HCC)    Past Surgical History:  Procedure Laterality Date   CESAREAN SECTION     CHOLECYSTECTOMY     TUBAL LIGATION     Social History:  reports that she has been smoking cigarettes. She has a 15 pack-year smoking history. She has never used smokeless tobacco. She reports current drug use. Drug: Marijuana. She reports that she does not drink alcohol.  Allergies  Allergen Reactions   Penicillins Nausea And Vomiting and Other (See Comments)    Has patient had a PCN reaction causing immediate rash, facial/tongue/throat swelling, SOB or lightheadedness with hypotension: no Has patient had a PCN reaction causing severe rash involving mucus membranes or skin necrosis: no Has patient had a PCN reaction that required hospitalization no Has patient had a PCN reaction occurring within the last 10 years: unknown If all of the above answers are "NO", then may proceed with Cephalosporin use.     Family History  Problem Relation Age of Onset   Diabetes Mother     Prior to Admission medications   Medication Sig Start Date End Date Taking? Authorizing Provider  albuterol (VENTOLIN HFA) 108 (90 Base) MCG/ACT inhaler Inhale 2 puffs into the lungs every 6 (six) hours as needed for wheezing or shortness of breath. 02/14/23 03/16/23  Evlyn Kanner  T, PA-C  azithromycin (ZITHROMAX) 500 MG tablet Take for 2 days 02/24/22   Meredeth Ide, MD  budesonide-formoterol [facility]) 80-4.5 MCG/ACT inhaler Inhale 2 puffs into the lungs in the morning and at bedtime. 02/23/22   Meredeth Ide, MD  gabapentin (NEURONTIN) 300 MG capsule Take 1 capsule (300 mg total) by mouth 3 (three) times daily for 7 days. 03/05/22 03/12/22  Loeffler, Finis Bud, PA-C   guaiFENesin (MUCINEX) 600 MG 12 hr tablet Take 1 tablet (600 mg total) by mouth 2 (two) times daily. 02/23/22   Meredeth Ide, MD  ipratropium-albuterol (DUONEB) 0.5-2.5 (3) MG/3ML SOLN Take 3 mLs by nebulization every 6 (six) hours as needed. 02/23/22   Meredeth Ide, MD  methocarbamol (ROBAXIN) 500 MG tablet Take 1 tablet (500 mg total) by mouth 2 (two) times daily. 03/03/22   Gailen Shelter, PA  oseltamivir (TAMIFLU) 75 MG capsule Take 1 capsule (75 mg total) by mouth every 12 (twelve) hours. 02/14/23   Netta Corrigan, PA-C  predniSONE (STERAPRED UNI-PAK 21 TAB) 10 MG (21) TBPK tablet Take by mouth daily. Take 6 tabs by mouth daily  for 2 days, then 5 tabs for 2 days, then 4 tabs for 2 days, then 3 tabs for 2 days, 2 tabs for 2 days, then 1 tab by mouth daily for 2 days 03/05/22   Claudie Leach, PA-C    Physical Exam: Vitals:   10/24/23 0447 10/24/23 0451 10/24/23 0500 10/24/23 0550  BP:   122/69 126/76  Pulse:  81 98 82  Resp:   20 15  Temp: 97.8 F (36.6 C) 98.4 F (36.9 C)  98.5 F (36.9 C)  TempSrc: Axillary Oral  Oral  SpO2:   (!) 82% 98%  Weight:    45.9 kg  Height:    5\' 2"  (1.575 m)   Physical Exam Vitals and nursing note reviewed.  Constitutional:      General: She is awake. She is not in acute distress.    Appearance: She is normal weight.  HENT:     Head: Normocephalic.     Nose: No rhinorrhea.     Mouth/Throat:     Mouth: Mucous membranes are moist.  Eyes:     General: No scleral icterus.    Pupils: Pupils are equal, round, and reactive to light.  Neck:     Vascular: No JVD.  Cardiovascular:     Rate and Rhythm: Normal rate and regular rhythm.  Pulmonary:     Effort: No tachypnea or accessory muscle usage.     Breath sounds: Decreased breath sounds, wheezing and rhonchi present. No rales.  Abdominal:     General: Bowel sounds are normal. There is no distension.     Palpations: Abdomen is soft.     Tenderness: There is no abdominal tenderness.   Musculoskeletal:     Cervical back: Neck supple.     Right lower leg: No edema.     Left lower leg: No edema.  Skin:    General: Skin is warm and dry.  Neurological:     General: No focal deficit present.     Mental Status: She is alert and oriented to person, place, and time.  Psychiatric:        Mood and Affect: Mood normal.        Behavior: Behavior normal. Behavior is cooperative.     Data Reviewed:  Results are pending, will review when available.  Assessment and Plan: Principal Problem:  COPD with acute exacerbation (HCC) Inpatient/SDU. Continue supplemental oxygen. Methylprednisolone 125 mg IVP x1. Followed by prednisone 40 mg p.o. daily in a.m. Scheduled and as needed bronchodilators. Follow-up CBC and chemistry in the morning.  Clinically improved and will transfer to PCU.  Active Problems:   Tobacco abuse Tobacco cessation advised. Nicotine replacement therapy ordered.    Incontinence in female This is a chronic issue. Follow-up with PCP/GYN as an outpatient.    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 Smoking cessation. COPD management optimization. Needs to follow-up with pulmonary as an outpatient.    Hypocalcemia Check calcium with albumin level in AM.    Hyperglycemia Recheck fasting glucose. Further workup depending on results.    MRSA carrier  Treatment protocol started.   Advance Care Planning:   Code Status: Full Code   Consults:   Family Communication:   Severity of Illness: The appropriate patient status for this patient is OBSERVATION. Observation status is judged to be reasonable and necessary in order to provide the required intensity of service to ensure the patient's safety. The patient's presenting symptoms, physical exam findings, and initial radiographic and laboratory data in the context of their medical condition is felt to place them at decreased risk for further clinical deterioration. Furthermore, it is anticipated  that the patient will be medically stable for discharge from the hospital within 2 midnights of admission.   Author: Bobette Mo, MD 10/24/2023 7:11 AM  For on call review [email or web address].   This document was prepared using Dragon voice recognition software and may contain some unintended transcription errors.
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
## 2023-10-24 NOTE — Progress Notes (Signed)
   10/24/23 2111  BiPAP/CPAP/SIPAP  Reason BIPAP/CPAP not in use Other(comment) (No need of bipap at this time. pt is on [place] 5L Home regimen and no resp distress. Machine remained bedside)  BiPAP/CPAP /SiPAP Vitals  Resp 20  MEWS Score/Color  MEWS Score 0  MEWS Score Color Green

## 2023-10-25 DIAGNOSIS — Z22322 Carrier or suspected carrier of Methicillin resistant Staphylococcus aureus: Secondary | ICD-10-CM

## 2023-10-25 DIAGNOSIS — R739 Hyperglycemia, unspecified: Secondary | ICD-10-CM | POA: Diagnosis not present

## 2023-10-25 DIAGNOSIS — R32 Unspecified urinary incontinence: Secondary | ICD-10-CM

## 2023-10-25 DIAGNOSIS — J441 Chronic obstructive pulmonary disease with (acute) exacerbation: Secondary | ICD-10-CM | POA: Diagnosis not present

## 2023-10-25 DIAGNOSIS — Z720 Tobacco use: Secondary | ICD-10-CM

## 2023-10-25 LAB — COMPREHENSIVE METABOLIC PANEL
ALT: 19 U/L (ref 0–44)
AST: 16 U/L (ref 15–41)
Albumin: 3.2 g/dL — ABNORMAL LOW (ref 3.5–5.0)
Alkaline Phosphatase: 44 U/L (ref 38–126)
Anion gap: 8 (ref 5–15)
BUN: 22 mg/dL — ABNORMAL HIGH (ref 6–20)
CO2: 30 mmol/L (ref 22–32)
Calcium: 8.7 mg/dL — ABNORMAL LOW (ref 8.9–10.3)
Chloride: 98 mmol/L (ref 98–111)
Creatinine, Ser: 0.71 mg/dL (ref 0.44–1.00)
GFR, Estimated: >60 mL/min (ref >60)
Glucose, Bld: 118 mg/dL — ABNORMAL HIGH (ref 70–99)
Potassium: 4.1 mmol/L (ref 3.5–5.1)
Sodium: 136 mmol/L (ref 135–145)
Total Bilirubin: 0.4 mg/dL (ref 0.3–1.2)
Total Protein: 6.4 g/dL — ABNORMAL LOW (ref 6.5–8.1)

## 2023-10-25 LAB — CBC
HCT: 47.7 % — ABNORMAL HIGH (ref 36.0–46.0)
Hemoglobin: 15.4 g/dL — ABNORMAL HIGH (ref 12.0–15.0)
MCH: 32 pg (ref 26.0–34.0)
MCHC: 32.3 g/dL (ref 30.0–36.0)
MCV: 99 fL (ref 80.0–100.0)
Platelets: 251 10*3/uL (ref 150–400)
RBC: 4.82 MIL/uL (ref 3.87–5.11)
RDW: 12.5 % (ref 11.5–15.5)
WBC: 12.2 10*3/uL — ABNORMAL HIGH (ref 4.0–10.5)
nRBC: 0 % (ref 0.0–0.2)

## 2023-10-25 LAB — HIV ANTIBODY (ROUTINE TESTING W REFLEX): HIV Screen 4th Generation wRfx: NONREACTIVE

## 2023-10-25 MED ORDER — DOXYCYCLINE HYCLATE 100 MG PO TABS: 100.0000 mg | ORAL_TABLET | Freq: Two times a day (BID) | ORAL | Status: DC

## 2023-10-25 MED ORDER — GUAIFENESIN ER 600 MG PO TB12
1200.0000 mg | ORAL_TABLET | Freq: Two times a day (BID) | ORAL | Status: DC
  Administered 2023-10-25 – 2023-10-26 (×2): 1200 mg via ORAL
  Filled 2023-10-25 (×2): qty 2

## 2023-10-25 MED ORDER — ALUM & MAG HYDROXIDE-SIMETH 200-200-20 MG/5ML PO SUSP
15.0000 mL | Freq: Four times a day (QID) | ORAL | Status: DC | PRN
  Administered 2023-10-25 (×2): 15 mL via ORAL
  Filled 2023-10-25 (×2): qty 30

## 2023-10-25 MED ORDER — PANTOPRAZOLE SODIUM 40 MG PO TBEC
40.0000 mg | DELAYED_RELEASE_TABLET | Freq: Every day | ORAL | Status: DC
  Administered 2023-10-25 – 2023-10-26 (×2): 40 mg via ORAL
  Filled 2023-10-25 (×2): qty 1

## 2023-10-25 MED ORDER — IPRATROPIUM BROMIDE 0.02 % IN SOLN
0.5000 mg | Freq: Four times a day (QID) | RESPIRATORY_TRACT | Status: DC
  Administered 2023-10-25: 0.5 mg via RESPIRATORY_TRACT
  Filled 2023-10-25: qty 2.5

## 2023-10-25 MED ORDER — LEVALBUTEROL HCL 0.63 MG/3ML IN NEBU
0.6300 mg | INHALATION_SOLUTION | Freq: Four times a day (QID) | RESPIRATORY_TRACT | Status: DC
  Administered 2023-10-26: 0.63 mg via RESPIRATORY_TRACT
  Filled 2023-10-25: qty 3

## 2023-10-25 MED ORDER — LEVALBUTEROL HCL 0.63 MG/3ML IN NEBU
0.6300 mg | INHALATION_SOLUTION | Freq: Four times a day (QID) | RESPIRATORY_TRACT | Status: DC
  Administered 2023-10-25: 0.63 mg via RESPIRATORY_TRACT
  Filled 2023-10-25: qty 3

## 2023-10-25 MED ORDER — BUDESONIDE 0.25 MG/2ML IN SUSP
0.2500 mg | Freq: Two times a day (BID) | RESPIRATORY_TRACT | Status: DC
  Administered 2023-10-25 – 2023-10-26 (×2): 0.25 mg via RESPIRATORY_TRACT
  Filled 2023-10-25 (×2): qty 2

## 2023-10-25 MED ORDER — IPRATROPIUM BROMIDE 0.02 % IN SOLN
0.5000 mg | Freq: Four times a day (QID) | RESPIRATORY_TRACT | Status: DC
  Administered 2023-10-26: 0.5 mg via RESPIRATORY_TRACT
  Filled 2023-10-25: qty 2.5

## 2023-10-25 MED ORDER — ARFORMOTEROL TARTRATE 15 MCG/2ML IN NEBU
15.0000 ug | INHALATION_SOLUTION | Freq: Two times a day (BID) | RESPIRATORY_TRACT | Status: DC
  Administered 2023-10-25 – 2023-10-26 (×2): 15 ug via RESPIRATORY_TRACT
  Filled 2023-10-25 (×2): qty 2

## 2023-10-25 NOTE — Plan of Care (Signed)

## 2023-10-25 NOTE — Progress Notes (Signed)
PROGRESS NOTE    Linda Foley  UJW:[id number] DOB: 1966-02-25 DOA: 10/24/2023 PCP: Pcp, No   Brief Narrative:  The patient is a 57 year old Caucasian female with a past medical history significant for but limited to anxiety, polysubstance abuse, tobacco abuse, COPD with chronic hypoxic respiratory failure with 4 L of baseline oxygen, other comorbidities who presented to the ED with progressively worsening shortness of breath associated with wheezing and dyspnea.  Recently she was seen at [facility] on 10/21/2003 left AMA.  She came to the hospital for shortness of breath and was given nebulizer, methylprednisone, magnesium and brought in for further evaluation.  Initially O2 requirements were significant had to be placed on BiPAP but was weaned off and then transferred from the stepdown unit to the progressive care floor.  Feels a little bit better with her shortness of breath  Assessment and Plan:  COPD with Acute Exacerbation (HCC) Chronic respiratory failure with hypoxia on 4 L of supplemental oxygen via nasal cannula -Inpatient SDU -> PCU as she is clinically improved -Continuous pulse oximetry and maintain O2 saturation greater 90% -Continue supplemental oxygen via nasal cannula and wean O2 as tolerated -SpO2: 98 % O2 Flow Rate (L/min): 5 L/min FiO2 (%): 40 % -Chronically wears 4 L of supplemental oxygen via nasal cannula and was noted to have O2 sats in the 70s on her 4 L.  She was seen at [facility] and left AMA and was brought back with worsening shortness of breath -Given Methylprednisolone 125 mg IVP x1 and Followed by prednisone 40 mg p.o. daily this AM -RSV, Influenza A/B, and SARS CoV-2 PCR Negative  -Add Guaifenesin 1200 mg po BID, Flutter Valve and Incentive Spirometry -Scheduled Nebs with Xopenex/Atrovent, Brovana, and Budesonide Follow-up CBC and chemistry in the morning.  -C/w Azithromycin 250 mg po Daily -Check Respiratory Virus Panel and place on  Droplet Precautions -She will need an ambulatory home O2 screen prior to discharge and repeat chest x-ray in the a.m.   Tobacco Abuse -Tobacco cessation advised. -Nicotine replacement therapy ordered.   Incontinence in female -This is a chronic issue. -Follow-up with PCP/GYN as an outpatient.   Erythrocytosis  -Secondary to COPD and smoking. -Hgb/Hct Trend: Recent Labs  Lab 10/21/23 2255 10/24/23 0127 10/25/23 0404  HGB 16.7* 16.9* 15.4*  HCT 51.9* 52.5* 47.7*  MCV 96.1 97.9 99.0  -Recommending Smoking cessation and COPD management optimization. -Needs to follow-up with pulmonary as an outpatient.   Hypocalcemia -Ca2+ Trend: Recent Labs  Lab 10/21/23 2255 10/24/23 0127 10/25/23 0404  CALCIUM 8.7* 8.7* 8.7*  -Corrected Ca2+ for Albumin was 9.3 -Continue to Monitor and Trend and Repeat CMP in the AM   Hyperglycemia -CBG and Glucose Trend: No results for input(s): "GLUCAP" in the last 720 hours. Recent Labs  Lab 10/21/23 2255 10/24/23 0127 10/25/23 0404  GLUCOSE 117* 124* 118*    MRSA Carrier  -Treatment protocol started.  Leukocytosis -In the setting of steroid to margination -WBC Trend: Recent Labs  Lab 10/21/23 2255 10/24/23 0127 10/25/23 0404  WBC 7.4 9.4 12.2*  -Continue to Monitor for S/Sx of Infection; Repeat CBC in the AM  GERD/GI Prophylaxis -Start Pantoprazole 40 mg po Daily and add Maalox  Hypoalbuminemia -Patient's Albumin Trend: Recent Labs  Lab 10/25/23 0404  ALBUMIN 3.2*  -Continue to Monitor and Trend and repeat CMP in the AM   DVT prophylaxis: enoxaparin (LOVENOX) injection 30 mg Start: 10/24/23 1000    Code Status: Full Code Family Communication: No  family currently at bedside  Disposition Plan:  Level of care: Progressive Status is: Inpatient Remains inpatient appropriate because: Needs further clinical improvement   Consultants:  None  Procedures:  As delineated as above  Antimicrobials:  Anti-infectives (From  admission, onward)    Start     Dose/Rate Route Frequency Ordered Stop   10/25/23 2000  doxycycline (VIBRA-TABS) tablet 100 mg  Status:  Discontinued        100 mg Oral Every 12 hours 10/25/23 1858 10/25/23 1901   10/25/23 0800  azithromycin (ZITHROMAX) tablet 250 mg       Note to Pharmacy: Adjust as needed please.TY   250 mg Oral Daily 10/24/23 1119 10/31/23 0959   10/24/23 0345  azithromycin (ZITHROMAX) 500 mg in dextrose 5 % 250 mL IVPB  Status:  Discontinued        500 mg 250 mL/hr over 60 Minutes Intravenous Every 24 hours 10/24/23 0342 10/24/23 1119       Subjective: Seen and examined at bedside thinks her shortness of breath is doing a bit better.  Feels better compared to yesterday and denies any nausea or vomiting.  Denies any other concerns or points this time.  Objective: Vitals:   10/25/23 0444 10/25/23 0819 10/25/23 0821 10/25/23 1225  BP: 116/77   (!) 148/89  Pulse: 81   84  Resp: 16   20  Temp: 98.4 F (36.9 C)   98.5 F (36.9 C)  TempSrc: Oral   Oral  SpO2: 97% 96% 96% 98%  Weight:      Height:        Intake/Output Summary (Last 24 hours) at 10/25/2023 1903 Last data filed at 10/25/2023 1700 Gross per 24 hour  Intake 490 ml  Output 1050 ml  Net -560 ml   Filed Weights   10/24/23 0550  Weight: 45.9 kg   Examination: Physical Exam:  Constitutional: Thin Caucasian female in no acute distress Respiratory: Diminished to auscultation bilaterally with some coarse breath sounds and has some wheezing and some slight rhonchi but no appreciable crackles or rales. Normal respiratory effort and patient is not tachypenic. No accessory muscle use.  Wearing supplemental oxygen via nasal cannula Cardiovascular: RRR, no murmurs / rubs / gallops. S1 and S2 auscultated. No extremity edema.  Abdomen: Soft, non-tender, non-distended. Bowel sounds positive.  GU: Deferred. Musculoskeletal: No clubbing / cyanosis of digits/nails. No joint deformity upper and lower extremities.   Skin: No rashes, lesions, ulcers on limited skin evaluation. No induration; Warm and dry.  Neurologic: CN 2-12 grossly intact with no focal deficits. Romberg sign and cerebellar reflexes not assessed.  Psychiatric: Normal judgment and insight. Alert and oriented x 3. Normal mood and appropriate affect.   Data Reviewed: I have personally reviewed following labs and imaging studies  CBC: Recent Labs  Lab 10/21/23 2255 10/24/23 0127 10/25/23 0404  WBC 7.4 9.4 12.2*  NEUTROABS 4.9 6.7  --   HGB 16.7* 16.9* 15.4*  HCT 51.9* 52.5* 47.7*  MCV 96.1 97.9 99.0  PLT 226 272 251   Basic Metabolic Panel: Recent Labs  Lab 10/21/23 2255 10/24/23 0127 10/25/23 0404  NA 138 138 136  K 4.8 3.9 4.1  CL 101 100 98  CO2 26 31 30   GLUCOSE 117* 124* 118*  BUN 12 15 22*  CREATININE 0.70 0.71 0.71  CALCIUM 8.7* 8.7* 8.7*   GFR: Estimated Creatinine Clearance: 56.9 mL/min (by C-G formula based on SCr of 0.71 mg/dL). Liver Function Tests: Recent Labs  Lab  10/25/23 0404  AST 16  ALT 19  ALKPHOS 44  BILITOT 0.4  PROT 6.4*  ALBUMIN 3.2*   No results for input(s): "LIPASE", "AMYLASE" in the last 168 hours. No results for input(s): "AMMONIA" in the last 168 hours. Coagulation Profile: No results for input(s): "INR", "PROTIME" in the last 168 hours. Cardiac Enzymes: No results for input(s): "CKTOTAL", "CKMB", "CKMBINDEX", "TROPONINI" in the last 168 hours. BNP (last 3 results) No results for input(s): "PROBNP" in the last 8760 hours. HbA1C: No results for input(s): "HGBA1C" in the last 72 hours. CBG: No results for input(s): "GLUCAP" in the last 168 hours. Lipid Profile: No results for input(s): "CHOL", "HDL", "LDLCALC", "TRIG", "CHOLHDL", "LDLDIRECT" in the last 72 hours. Thyroid Function Tests: No results for input(s): "TSH", "T4TOTAL", "FREET4", "T3FREE", "THYROIDAB" in the last 72 hours. Anemia Panel: No results for input(s): "VITAMINB12", "FOLATE", "FERRITIN", "TIBC", "IRON",  "RETICCTPCT" in the last 72 hours. Sepsis Labs: No results for input(s): "PROCALCITON", "LATICACIDVEN" in the last 168 hours.  Recent Results (from the past 240 hour(s))  Resp panel by RT-PCR (RSV, Flu A&B, Covid) Anterior Nasal Swab     Status: None   Collection Time: 10/21/23 10:40 PM   Specimen: Anterior Nasal Swab  Result Value Ref Range Status   SARS Coronavirus 2 by RT PCR NEGATIVE NEGATIVE Final   Influenza A by PCR NEGATIVE NEGATIVE Final   Influenza B by PCR NEGATIVE NEGATIVE Final    Comment: (NOTE) The Xpert Xpress SARS-CoV-2/FLU/RSV plus assay is intended as an aid in the diagnosis of influenza from Nasopharyngeal swab specimens and should not be used as a sole basis for treatment. Nasal washings and aspirates are unacceptable for Xpert Xpress SARS-CoV-2/FLU/RSV testing.  Fact Sheet for Patients: BloggerCourse.com  Fact Sheet for Healthcare Providers: SeriousBroker.it  This test is not yet approved or cleared by the [place] FDA and has been authorized for detection and/or diagnosis of SARS-CoV-2 by FDA under an Emergency Use Authorization (EUA). This EUA will remain in effect (meaning this test can be used) for the duration of the COVID-19 declaration under Section 564(b)(1) of the Act, 21 U.S.C. section 360bbb-3(b)(1), unless the authorization is terminated or revoked.     Resp Syncytial Virus by PCR NEGATIVE NEGATIVE Final    Comment: (NOTE) Fact Sheet for Patients: BloggerCourse.com  Fact Sheet for Healthcare Providers: SeriousBroker.it  This test is not yet approved or cleared by the [place] FDA and has been authorized for detection and/or diagnosis of SARS-CoV-2 by FDA under an Emergency Use Authorization (EUA). This EUA will remain in effect (meaning this test can be used) for the duration of the COVID-19 declaration under Section 564(b)(1) of  the Act, 21 U.S.C. section 360bbb-3(b)(1), unless the authorization is terminated or revoked.  Performed at [facility], [address], [place], [place] [address]   Resp panel by RT-PCR (RSV, Flu A&B, Covid) Anterior Nasal Swab     Status: None   Collection Time: 10/24/23  1:27 AM   Specimen: Anterior Nasal Swab  Result Value Ref Range Status   SARS Coronavirus 2 by RT PCR NEGATIVE NEGATIVE Final    Comment: (NOTE) SARS-CoV-2 target nucleic acids are NOT DETECTED.  The SARS-CoV-2 RNA is generally detectable in upper respiratory specimens during the acute phase of infection. The lowest concentration of SARS-CoV-2 viral copies this assay can detect is 138 copies/mL. A negative result does not preclude SARS-Cov-2 infection and should not be used as the sole basis for treatment or other  patient management decisions. A negative result may occur with  improper specimen collection/handling, submission of specimen other than nasopharyngeal swab, presence of viral mutation(s) within the areas targeted by this assay, and inadequate number of viral copies(<138 copies/mL). A negative result must be combined with clinical observations, patient history, and epidemiological information. The expected result is Negative.  Fact Sheet for Patients:  BloggerCourse.com  Fact Sheet for Healthcare Providers:  SeriousBroker.it  This test is no t yet approved or cleared by the [place] FDA and  has been authorized for detection and/or diagnosis of SARS-CoV-2 by FDA under an Emergency Use Authorization (EUA). This EUA will remain  in effect (meaning this test can be used) for the duration of the COVID-19 declaration under Section 564(b)(1) of the Act, 21 U.S.C.section 360bbb-3(b)(1), unless the authorization is terminated  or revoked sooner.       Influenza A by PCR NEGATIVE NEGATIVE Final   Influenza B by PCR NEGATIVE NEGATIVE Final     Comment: (NOTE) The Xpert Xpress SARS-CoV-2/FLU/RSV plus assay is intended as an aid in the diagnosis of influenza from Nasopharyngeal swab specimens and should not be used as a sole basis for treatment. Nasal washings and aspirates are unacceptable for Xpert Xpress SARS-CoV-2/FLU/RSV testing.  Fact Sheet for Patients: BloggerCourse.com  Fact Sheet for Healthcare Providers: SeriousBroker.it  This test is not yet approved or cleared by the [place] FDA and has been authorized for detection and/or diagnosis of SARS-CoV-2 by FDA under an Emergency Use Authorization (EUA). This EUA will remain in effect (meaning this test can be used) for the duration of the COVID-19 declaration under Section 564(b)(1) of the Act, 21 U.S.C. section 360bbb-3(b)(1), unless the authorization is terminated or revoked.     Resp Syncytial Virus by PCR NEGATIVE NEGATIVE Final    Comment: (NOTE) Fact Sheet for Patients: BloggerCourse.com  Fact Sheet for Healthcare Providers: SeriousBroker.it  This test is not yet approved or cleared by the [place] FDA and has been authorized for detection and/or diagnosis of SARS-CoV-2 by FDA under an Emergency Use Authorization (EUA). This EUA will remain in effect (meaning this test can be used) for the duration of the COVID-19 declaration under Section 564(b)(1) of the Act, 21 U.S.C. section 360bbb-3(b)(1), unless the authorization is terminated or revoked.  Performed at [facility], [address] [place], [place] [address]   MRSA Next Gen by PCR, Nasal     Status: Abnormal   Collection Time: 10/24/23  5:45 AM   Specimen: Nasal Mucosa; Nasal Swab  Result Value Ref Range Status   MRSA by PCR Next Gen DETECTED (A) NOT DETECTED Final    Comment: RESULT CALLED TO, READ BACK BY AND VERIFIED WITH: KOONTZ, A. RN AT [phone] ON 10/24/2023 BY  MECIAL J. (NOTE) The GeneXpert MRSA Assay (FDA approved for NASAL specimens only), is one component of a comprehensive MRSA colonization surveillance program. It is not intended to diagnose MRSA infection nor to guide or monitor treatment for MRSA infections. Test performance is not FDA approved in patients less than 91 years old. Performed at [facility], [address] [place], [place] [address]     Radiology Studies: [facility] Chest Port 1 View  Result Date: 10/24/2023 CLINICAL DATA:  COPD and shortness of breath EXAM: PORTABLE CHEST 1 VIEW COMPARISON:  10/21/2023 FINDINGS: Hyperinflation. No focal consolidation, pleural effusion, or pneumothorax. No displaced rib fractures. Stable cardiomediastinal silhouette. IMPRESSION: No acute process.  Emphysema. Electronically Signed   By: Joselyn Glassman  Stutzman M.D.   On: 10/24/2023 03:26    Scheduled Meds:  arformoterol  15 mcg Nebulization BID   azithromycin  250 mg Oral Daily   budesonide (PULMICORT) nebulizer solution  0.25 mg Nebulization BID   Chlorhexidine Gluconate Cloth  6 each Topical Daily   enoxaparin (LOVENOX) injection  30 mg Subcutaneous Q24H   feeding supplement  237 mL Oral BID BM   guaiFENesin  1,200 mg Oral BID   influenza vac split trivalent PF  0.5 mL Intramuscular Tomorrow-1000   ipratropium  0.5 mg Nebulization Q6H   levalbuterol  0.63 mg Nebulization Q6H   mupirocin ointment  1 Application Nasal BID   nicotine  14 mg Transdermal Daily   pantoprazole  40 mg Oral Daily   pneumococcal 20-valent conjugate vaccine  0.5 mL Intramuscular Tomorrow-1000   predniSONE  40 mg Oral Q breakfast   Continuous Infusions:   LOS: 1 day   Marguerita Merles, DO [facility] Available via Epic secure chat 7am-7pm After these hours, please refer to coverage provider listed on [email or web address] 10/25/2023, 7:03 PM

## 2023-10-25 NOTE — Hospital Course (Addendum)
The patient is a 57 year old Caucasian female with a past medical history significant for but limited to anxiety, polysubstance abuse, tobacco abuse, COPD with chronic hypoxic respiratory failure with 4 L of baseline oxygen, other comorbidities who presented to the ED with progressively worsening shortness of breath associated with wheezing and dyspnea.  Recently she was seen at [facility] on 10/21/2003 left AMA.  She came to the hospital for shortness of breath and was given nebulizer, methylprednisone, magnesium and brought in for further evaluation.  Initially O2 requirements were significant had to be placed on BiPAP but was weaned off and then transferred from the stepdown unit to the progressive care floor.  Feels a little bit better with her shortness of breath  Assessment and Plan:  COPD with Acute Exacerbation (HCC) Chronic respiratory failure with hypoxia on 4 L of supplemental oxygen via nasal cannula -Inpatient SDU -> PCU as she is clinically improved -Continuous pulse oximetry and maintain O2 saturation greater 90% -Continue supplemental oxygen via nasal cannula and wean O2 as tolerated -SpO2: 98 % O2 Flow Rate (L/min): 5 L/min FiO2 (%): 40 % -Chronically wears 4 L of supplemental oxygen via nasal cannula and was noted to have O2 sats in the 70s on her 4 L.  She was seen at [facility] and left AMA and was brought back with worsening shortness of breath -Given Methylprednisolone 125 mg IVP x1 and Followed by prednisone 40 mg p.o. daily this AM -RSV, Influenza A/B, and SARS CoV-2 PCR Negative  -Add Guaifenesin 1200 mg po BID, Flutter Valve and Incentive Spirometry -Scheduled Nebs with Xopenex/Atrovent, Brovana, and Budesonide Follow-up CBC and chemistry in the morning.  -C/w Azithromycin 250 mg po Daily -Check Respiratory Virus Panel and place on Droplet Precautions -She will need an ambulatory home O2 screen prior to discharge and repeat chest x-ray in the a.m.    Tobacco Abuse -Tobacco cessation advised. -Nicotine replacement therapy ordered.   Incontinence in female -This is a chronic issue. -Follow-up with PCP/GYN as an outpatient.   Erythrocytosis  -Secondary to COPD and smoking. -Hgb/Hct Trend: Recent Labs  Lab 10/21/23 2255 10/24/23 0127 10/25/23 0404  HGB 16.7* 16.9* 15.4*  HCT 51.9* 52.5* 47.7*  MCV 96.1 97.9 99.0  -Recommending Smoking cessation and COPD management optimization. -Needs to follow-up with pulmonary as an outpatient.   Hypocalcemia -Ca2+ Trend: Recent Labs  Lab 10/21/23 2255 10/24/23 0127 10/25/23 0404  CALCIUM 8.7* 8.7* 8.7*  -Corrected Ca2+ for Albumin was 9.3 -Continue to Monitor and Trend and Repeat CMP in the AM   Hyperglycemia -CBG and Glucose Trend: No results for input(s): "GLUCAP" in the last 720 hours. Recent Labs  Lab 10/21/23 2255 10/24/23 0127 10/25/23 0404  GLUCOSE 117* 124* 118*    MRSA Carrier  -Treatment protocol started.  Leukocytosis -In the setting of steroid to margination -WBC Trend: Recent Labs  Lab 10/21/23 2255 10/24/23 0127 10/25/23 0404  WBC 7.4 9.4 12.2*  -Continue to Monitor for S/Sx of Infection; Repeat CBC in the AM  GERD/GI Prophylaxis -Start Pantoprazole 40 mg po Daily and add Maalox  Hypoalbuminemia -Patient's Albumin Trend: Recent Labs  Lab 10/25/23 0404  ALBUMIN 3.2*  -Continue to Monitor and Trend and repeat CMP in the AM

## 2023-10-26 ENCOUNTER — Inpatient Hospital Stay (HOSPITAL_COMMUNITY): Payer: 59

## 2023-10-26 DIAGNOSIS — R739 Hyperglycemia, unspecified: Secondary | ICD-10-CM | POA: Diagnosis not present

## 2023-10-26 DIAGNOSIS — J441 Chronic obstructive pulmonary disease with (acute) exacerbation: Secondary | ICD-10-CM | POA: Diagnosis not present

## 2023-10-26 DIAGNOSIS — R32 Unspecified urinary incontinence: Secondary | ICD-10-CM | POA: Diagnosis not present

## 2023-10-26 LAB — CBC WITH DIFFERENTIAL/PLATELET
Abs Immature Granulocytes: 0.06 10*3/uL (ref 0.00–0.07)
Basophils Absolute: 0 10*3/uL (ref 0.0–0.1)
Basophils Relative: 0 %
Eosinophils Absolute: 0 10*3/uL (ref 0.0–0.5)
Eosinophils Relative: 0 %
HCT: 52.8 % — ABNORMAL HIGH (ref 36.0–46.0)
Hemoglobin: 16.5 g/dL — ABNORMAL HIGH (ref 12.0–15.0)
Immature Granulocytes: 1 %
Lymphocytes Relative: 11 %
Lymphs Abs: 1.4 10*3/uL (ref 0.7–4.0)
MCH: 31.4 pg (ref 26.0–34.0)
MCHC: 31.3 g/dL (ref 30.0–36.0)
MCV: 100.4 fL — ABNORMAL HIGH (ref 80.0–100.0)
Monocytes Absolute: 1.2 10*3/uL — ABNORMAL HIGH (ref 0.1–1.0)
Monocytes Relative: 10 %
Neutro Abs: 10.1 10*3/uL — ABNORMAL HIGH (ref 1.7–7.7)
Neutrophils Relative %: 78 %
Platelets: 282 10*3/uL (ref 150–400)
RBC: 5.26 MIL/uL — ABNORMAL HIGH (ref 3.87–5.11)
RDW: 12.9 % (ref 11.5–15.5)
WBC: 12.8 10*3/uL — ABNORMAL HIGH (ref 4.0–10.5)
nRBC: 0 % (ref 0.0–0.2)

## 2023-10-26 LAB — RESPIRATORY PANEL BY PCR

## 2023-10-26 LAB — COMPREHENSIVE METABOLIC PANEL
ALT: 19 U/L (ref 0–44)
AST: 14 U/L — ABNORMAL LOW (ref 15–41)
Albumin: 3.6 g/dL (ref 3.5–5.0)
Alkaline Phosphatase: 50 U/L (ref 38–126)
Anion gap: 5 (ref 5–15)
BUN: 25 mg/dL — ABNORMAL HIGH (ref 6–20)
CO2: 27 mmol/L (ref 22–32)
Calcium: 8.7 mg/dL — ABNORMAL LOW (ref 8.9–10.3)
Chloride: 106 mmol/L (ref 98–111)
Creatinine, Ser: 0.54 mg/dL (ref 0.44–1.00)
GFR, Estimated: >60 mL/min (ref >60)
Glucose, Bld: 73 mg/dL (ref 70–99)
Potassium: 4.6 mmol/L (ref 3.5–5.1)
Sodium: 138 mmol/L (ref 135–145)
Total Bilirubin: 0.5 mg/dL (ref 0.3–1.2)
Total Protein: 7.4 g/dL (ref 6.5–8.1)

## 2023-10-26 LAB — PHOSPHORUS: Phosphorus: 3.7 mg/dL (ref 2.5–4.6)

## 2023-10-26 LAB — MAGNESIUM: Magnesium: 2.3 mg/dL (ref 1.7–2.4)

## 2023-10-26 MED ORDER — IPRATROPIUM BROMIDE 0.02 % IN SOLN: 0.5000 mg | Freq: Two times a day (BID) | RESPIRATORY_TRACT | Status: DC

## 2023-10-26 MED ORDER — POLYETHYLENE GLYCOL 3350 17 G PO PACK
17.0000 g | PACK | Freq: Two times a day (BID) | ORAL | Status: DC
  Administered 2023-10-26: 17 g via ORAL
  Filled 2023-10-26: qty 1

## 2023-10-26 MED ORDER — METHYLPREDNISOLONE SODIUM SUCC 125 MG IJ SOLR
60.0000 mg | Freq: Two times a day (BID) | INTRAMUSCULAR | Status: DC
  Administered 2023-10-26: 60 mg via INTRAVENOUS
  Filled 2023-10-26: qty 2

## 2023-10-26 MED ORDER — AZITHROMYCIN 250 MG PO TABS: 500.0000 mg | ORAL_TABLET | Freq: Every day | ORAL | Status: DC

## 2023-10-26 MED ORDER — BISACODYL 10 MG RE SUPP: 10.0000 mg | Freq: Every day | RECTAL | Status: DC | PRN

## 2023-10-26 MED ORDER — LEVALBUTEROL HCL 0.63 MG/3ML IN NEBU: 0.6300 mg | INHALATION_SOLUTION | Freq: Two times a day (BID) | RESPIRATORY_TRACT | Status: DC

## 2023-10-26 MED ORDER — SENNOSIDES-DOCUSATE SODIUM 8.6-50 MG PO TABS
1.0000 | ORAL_TABLET | Freq: Two times a day (BID) | ORAL | Status: DC
  Administered 2023-10-26: 1 via ORAL
  Filled 2023-10-26: qty 1

## 2023-10-26 NOTE — Progress Notes (Signed)
Patient requested to leave AMA. Patient educated on risks of leaving hospital prior to being medically stable for discharge. Patient still requested to leave AMA. Patient signed AMA form and left with family member.

## 2023-10-26 NOTE — Progress Notes (Signed)
Pt oxygen sats 95% on 4L at rest. Pt dropped to 86% on 4L [place] (what patient wears at baseline) with ambulation. Pt O2 sats 92% at 6L with ambulation.

## 2023-10-26 NOTE — Discharge Summary (Signed)
Physician Discharge Summary   Patient: Linda Foley MRN: [id number] DOB: Mar 18, 1966  Admit date:     10/24/2023  Discharge date: 10/26/23  Discharge Physician: Marguerita Merles   PCP: Pcp, No   Recommendations at discharge:   Follow up with PCP within 1-2 weeks and repeat CBC, CMP, Mag, Phos within 1 week Follow up with Pulmonary in the outpatient setting   Discharge Diagnoses: Principal Problem:   COPD with acute exacerbation (HCC) Active Problems:   Tobacco abuse   Incontinence in female   Polycythemia   Hypocalcemia   Hyperglycemia   MRSA carrier  Resolved Problems:   * No resolved hospital problems. [facility] Course: The patient is a 57 year old Caucasian female with a past medical history significant for but limited to anxiety, polysubstance abuse, tobacco abuse, COPD with chronic hypoxic respiratory failure with 4 L of baseline oxygen, other comorbidities who presented to the ED with progressively worsening shortness of breath associated with wheezing and dyspnea.  Recently she was seen at [facility] on 10/21/2003 left AMA.  She came to the hospital for shortness of breath and was given nebulizer, methylprednisone, magnesium and brought in for further evaluation.  Initially O2 requirements were significant had to be placed on BiPAP but was weaned off and then transferred from the stepdown unit to the progressive care floor.  Feels a little bit better with her shortness of breath and is improving slowly but has not had bowel movement.  Today on ambulation she did drop her saturation so we will stop her prednisone and start her back on IV Solu-Medrol for a day and continue to monitor.  Will need to repeat ambulatory home O2 screen in the AM.  **Later this evening the patient decided that she did not want to be hospitalized and decided to leave AGAINST MEDICAL ADVICE.  She is of sound mind and understands the risk of worsening, decompensation, returning to the hospital as  well as even possible death.  Assessment and Plan:  COPD with Acute Exacerbation (HCC) Chronic respiratory failure with hypoxia on 4 L of supplemental oxygen via nasal cannula -Inpatient SDU -> PCU as she is clinically improved -Continuous pulse oximetry and maintain O2 saturation greater 90% -Continue supplemental oxygen via nasal cannula and wean O2 as tolerated -SpO2: 93 % O2 Flow Rate (L/min): 5 L/min FiO2 (%): 40 % -Chronically wears 4 L of supplemental oxygen via nasal cannula and was noted to have O2 sats in the 70s on her 4 L.  She was seen at [facility] and left AMA and was brought back with worsening shortness of breath -Given Methylprednisolone 125 mg IVP x1 and Followed by prednisone 40 mg p.o. daily however will discontinue the p.o. prednisone placed back on IV Solu-Medrol 60 mg twice daily -RSV, Influenza A/B, and SARS CoV-2 PCR Negative  -Add Guaifenesin 1200 mg po BID, Flutter Valve and Incentive Spirometry -Scheduled Nebs with Xopenex/Atrovent, Brovana, and Budesonide Follow-up CBC and chemistry in the morning.  -C/w Azithromycin but increase to 500 mg p.o. daily mg po Daily -Respiratory virus panel is negative as well and will discontinue Droplet Precautions -She will need an ambulatory home O2 screen prior to discharge and repeat chest x-ray this a.m. showed hyperinflation with no acute findings  -Unfortunately the patient decided to leave AGAINST MEDICAL ADVICE again   Tobacco Abuse -Tobacco cessation advised. -Nicotine replacement therapy ordered with Nicotine Patch 14 mg TD   Incontinence in female -This is a chronic issue. -Follow-up  with PCP/GYN as an outpatient.   Erythrocytosis  -Secondary to COPD and smoking. -Hgb/Hct Trend: Recent Labs  Lab 10/21/23 2255 10/24/23 0127 10/25/23 0404 10/26/23 0425  HGB 16.7* 16.9* 15.4* 16.5*  HCT 51.9* 52.5* 47.7* 52.8*  MCV 96.1 97.9 99.0 100.4*  -Recommending Smoking cessation and COPD management  optimization. -Needs to follow-up with pulmonary as an outpatient.   Hypocalcemia -Ca2+ Trend: Recent Labs  Lab 10/21/23 2255 10/24/23 0127 10/25/23 0404 10/26/23 0425  CALCIUM 8.7* 8.7* 8.7* 8.7*  -Corrected Ca2+ for Albumin was 9.3 -Continue to Monitor and Trend and Repeat CMP in the AM   Hyperglycemia -CBG and Glucose Trend: No results for input(s): "GLUCAP" in the last 720 hours. Recent Labs  Lab 10/21/23 2255 10/24/23 0127 10/25/23 0404 10/26/23 0425  GLUCOSE 117* 124* 118* 73    MRSA Carrier  -Treatment protocol started with Mupirocin Ointment 2% 1 application Nasal BID  Leukocytosis -In the setting of steroid to margination -WBC Trend: Recent Labs  Lab 10/21/23 2255 10/24/23 0127 10/25/23 0404 10/26/23 0425  WBC 7.4 9.4 12.2* 12.8*  -Continue to Monitor for S/Sx of Infection; Repeat CBC in the AM  Elevated BUN -In the setting of Steroid Demargination -BUN Trend:  Recent Labs  Lab 10/21/23 2255 10/24/23 0127 10/25/23 0404 10/26/23 0425  BUN 12 15 22* 25*  -Continue to Monitor and Trend and repeat CMP in the AM  GERD/GI Prophylaxis -Start Pantoprazole 40 mg po Daily and add Maalox  Constipation -Start Senna-Docusate 1 tab po BID and Miralax 17 grams po BID -Also add Bisacodyl 10 mg RC Dailyprn Moderate Constipation  Hypoalbuminemia -Patient's Albumin Trend: Recent Labs  Lab 10/25/23 0404 10/26/23 0425  ALBUMIN 3.2* 3.6  -Continue to Monitor and Trend and repeat CMP in the AM  Consultants: None Procedures performed: None  Disposition:  LEFT AGAINST MEDICAL ADVICE  Diet recommendation:  Cardiac diet DISCHARGE MEDICATION: Allergies as of 10/26/2023       Reactions   Penicillins Nausea And Vomiting, Other (See Comments)   Has patient had a PCN reaction causing immediate rash, facial/tongue/throat swelling, SOB or lightheadedness with hypotension: no Has patient had a PCN reaction causing severe rash involving mucus membranes or skin  necrosis: no Has patient had a PCN reaction that required hospitalization no Has patient had a PCN reaction occurring within the last 10 years: unknown If all of the above answers are "NO", then may proceed with Cephalosporin use.        Medication List    You have not been prescribed any medications.            Durable Medical Equipment  (From admission, onward)           Start     Ordered   10/24/23 1139  For home use only DME oxygen  Once       Comments: Evaluate for POC.  Question Answer Comment  Length of Need Lifetime   Oxygen delivery system Gas      10/24/23 1139            Discharge Exam: Filed Weights   10/24/23 0550  Weight: 45.9 kg   Vitals:   10/26/23 0811 10/26/23 1354  BP:  106/71  Pulse:  73  Resp:  18  Temp:  98.4 F (36.9 C)  SpO2: 99% 93%   Examination from THIS AM: Physical Exam:   Constitutional: Thin older appearing than her stated age Caucasian female Respiratory: Diminished to auscultation bilaterally with coarse breath sounds  and has some wheezing but no rales, rhonchi or crackles. Normal respiratory effort and patient is not tachypenic. No accessory muscle use.  Wearing supplemental oxygen nasal cannula Cardiovascular: RRR, no murmurs / rubs / gallops. S1 and S2 auscultated. No extremity edema Abdomen: Soft, non-tender, non-distended. Bowel sounds positive.  GU: Deferred. Musculoskeletal: No clubbing / cyanosis of digits/nails. No joint deformity upper and lower extremities.  Skin: No rashes, lesions, ulcers on a limited skin evaluation. No induration; Warm and dry.  Neurologic: CN 2-12 grossly intact with no focal deficits. Romberg sign and cerebellar reflexes not assessed.  Psychiatric: Normal judgment and insight. Alert and oriented x 3. Normal mood and appropriate affect.   Condition at discharge:  Guarded  The results of significant diagnostics from this hospitalization (including imaging, microbiology, ancillary and  laboratory) are listed below for reference.   Imaging Studies: DG CHEST PORT 1 VIEW  Result Date: 10/26/2023 CLINICAL DATA:  Shortness of breath EXAM: PORTABLE CHEST - 1 VIEW COMPARISON:  10/24/2023 FINDINGS: Lungs clear, hyperinflated. Heart size and mediastinal contours are within normal limits. No effusion. Visualized bones unremarkable. IMPRESSION: Hyperinflation. No acute findings. Electronically Signed   By: Corlis Leak M.D.   On: 10/26/2023 09:15   DG Chest Port 1 View  Result Date: 10/24/2023 CLINICAL DATA:  COPD and shortness of breath EXAM: PORTABLE CHEST 1 VIEW COMPARISON:  10/21/2023 FINDINGS: Hyperinflation. No focal consolidation, pleural effusion, or pneumothorax. No displaced rib fractures. Stable cardiomediastinal silhouette. IMPRESSION: No acute process.  Emphysema. Electronically Signed   By: Minerva Fester M.D.   On: 10/24/2023 03:26   DG Chest Portable 1 View  Result Date: 10/22/2023 CLINICAL DATA:  Cough shortness of breath and wheezing EXAM: PORTABLE CHEST 1 VIEW COMPARISON:  02/19/2023 FINDINGS: Hyperinflation and chronic bronchitic change. No focal consolidation, pleural effusion, or pneumothorax. No displaced rib fractures. Stable cardiomediastinal silhouette. IMPRESSION: No acute process.  Emphysema. Electronically Signed   By: Minerva Fester M.D.   On: 10/22/2023 03:03    Microbiology: Results for orders placed or performed during the hospital encounter of 10/24/23  Resp panel by RT-PCR (RSV, Flu A&B, Covid) Anterior Nasal Swab     Status: None   Collection Time: 10/24/23  1:27 AM   Specimen: Anterior Nasal Swab  Result Value Ref Range Status   SARS Coronavirus 2 by RT PCR NEGATIVE NEGATIVE Final    Comment: (NOTE) SARS-CoV-2 target nucleic acids are NOT DETECTED.  The SARS-CoV-2 RNA is generally detectable in upper respiratory specimens during the acute phase of infection. The lowest concentration of SARS-CoV-2 viral copies this assay can detect is 138  copies/mL. A negative result does not preclude SARS-Cov-2 infection and should not be used as the sole basis for treatment or other patient management decisions. A negative result may occur with  improper specimen collection/handling, submission of specimen other than nasopharyngeal swab, presence of viral mutation(s) within the areas targeted by this assay, and inadequate number of viral copies(<138 copies/mL). A negative result must be combined with clinical observations, patient history, and epidemiological information. The expected result is Negative.  Fact Sheet for Patients:  BloggerCourse.com  Fact Sheet for Healthcare Providers:  SeriousBroker.it  This test is no t yet approved or cleared by the [place] FDA and  has been authorized for detection and/or diagnosis of SARS-CoV-2 by FDA under an Emergency Use Authorization (EUA). This EUA will remain  in effect (meaning this test can be used) for the duration of the COVID-19 declaration under Section 564(b)(1) of  the Act, 21 U.S.C.section 360bbb-3(b)(1), unless the authorization is terminated  or revoked sooner.       Influenza A by PCR NEGATIVE NEGATIVE Final   Influenza B by PCR NEGATIVE NEGATIVE Final    Comment: (NOTE) The Xpert Xpress SARS-CoV-2/FLU/RSV plus assay is intended as an aid in the diagnosis of influenza from Nasopharyngeal swab specimens and should not be used as a sole basis for treatment. Nasal washings and aspirates are unacceptable for Xpert Xpress SARS-CoV-2/FLU/RSV testing.  Fact Sheet for Patients: BloggerCourse.com  Fact Sheet for Healthcare Providers: SeriousBroker.it  This test is not yet approved or cleared by the [place] FDA and has been authorized for detection and/or diagnosis of SARS-CoV-2 by FDA under an Emergency Use Authorization (EUA). This EUA will remain in effect (meaning  this test can be used) for the duration of the COVID-19 declaration under Section 564(b)(1) of the Act, 21 U.S.C. section 360bbb-3(b)(1), unless the authorization is terminated or revoked.     Resp Syncytial Virus by PCR NEGATIVE NEGATIVE Final    Comment: (NOTE) Fact Sheet for Patients: BloggerCourse.com  Fact Sheet for Healthcare Providers: SeriousBroker.it  This test is not yet approved or cleared by the [place] FDA and has been authorized for detection and/or diagnosis of SARS-CoV-2 by FDA under an Emergency Use Authorization (EUA). This EUA will remain in effect (meaning this test can be used) for the duration of the COVID-19 declaration under Section 564(b)(1) of the Act, 21 U.S.C. section 360bbb-3(b)(1), unless the authorization is terminated or revoked.  Performed at [facility], [address] [place], [place] [address]   Respiratory (~20 pathogens) panel by PCR     Status: None   Collection Time: 10/24/23  1:27 AM   Specimen: Nasopharyngeal Swab; Respiratory  Result Value Ref Range Status   Adenovirus NOT DETECTED NOT DETECTED Final   Coronavirus 229E NOT DETECTED NOT DETECTED Final    Comment: (NOTE) The Coronavirus on the Respiratory Panel, DOES NOT test for the novel  Coronavirus (2019 nCoV)    Coronavirus HKU1 NOT DETECTED NOT DETECTED Final   Coronavirus NL63 NOT DETECTED NOT DETECTED Final   Coronavirus OC43 NOT DETECTED NOT DETECTED Final   Metapneumovirus NOT DETECTED NOT DETECTED Final   Rhinovirus / Enterovirus NOT DETECTED NOT DETECTED Final   Influenza A NOT DETECTED NOT DETECTED Final   Influenza B NOT DETECTED NOT DETECTED Final   Parainfluenza Virus 1 NOT DETECTED NOT DETECTED Final   Parainfluenza Virus 2 NOT DETECTED NOT DETECTED Final   Parainfluenza Virus 3 NOT DETECTED NOT DETECTED Final   Parainfluenza Virus 4 NOT DETECTED NOT DETECTED Final   Respiratory Syncytial  Virus NOT DETECTED NOT DETECTED Final   Bordetella pertussis NOT DETECTED NOT DETECTED Final   Bordetella Parapertussis NOT DETECTED NOT DETECTED Final   Chlamydophila pneumoniae NOT DETECTED NOT DETECTED Final   Mycoplasma pneumoniae NOT DETECTED NOT DETECTED Final    Comment: Performed at [facility], [address], [place], [place] [address]  MRSA Next Gen by PCR, Nasal     Status: Abnormal   Collection Time: 10/24/23  5:45 AM   Specimen: Nasal Mucosa; Nasal Swab  Result Value Ref Range Status   MRSA by PCR Next Gen DETECTED (A) NOT DETECTED Final    Comment: RESULT CALLED TO, READ BACK BY AND VERIFIED WITH: KOONTZ, A. RN AT [phone] ON 10/24/2023 BY MECIAL J. (NOTE) The GeneXpert MRSA Assay (FDA approved for NASAL specimens only), is one component of  a comprehensive MRSA colonization surveillance program. It is not intended to diagnose MRSA infection nor to guide or monitor treatment for MRSA infections. Test performance is not FDA approved in patients less than 92 years old. Performed at [facility], [address], [place], [place] [address]    Labs: CBC: Recent Labs  Lab 10/21/23 2255 10/24/23 0127 10/25/23 0404 10/26/23 0425  WBC 7.4 9.4 12.2* 12.8*  NEUTROABS 4.9 6.7  --  10.1*  HGB 16.7* 16.9* 15.4* 16.5*  HCT 51.9* 52.5* 47.7* 52.8*  MCV 96.1 97.9 99.0 100.4*  PLT 226 272 251 282   Basic Metabolic Panel: Recent Labs  Lab 10/21/23 2255 10/24/23 0127 10/25/23 0404 10/26/23 0425  NA 138 138 136 138  K 4.8 3.9 4.1 4.6  CL 101 100 98 106  CO2 26 31 30 27   GLUCOSE 117* 124* 118* 73  BUN 12 15 22* 25*  CREATININE 0.70 0.71 0.71 0.54  CALCIUM 8.7* 8.7* 8.7* 8.7*  MG  --   --   --  2.3  PHOS  --   --   --  3.7   Liver Function Tests: Recent Labs  Lab 10/25/23 0404 10/26/23 0425  AST 16 14*  ALT 19 19  ALKPHOS 44 50  BILITOT 0.4 0.5  PROT 6.4* 7.4  ALBUMIN 3.2* 3.6   CBG: No results for input(s): "GLUCAP" in the last 168  hours.  Discharge time spent: less than 30 minutes.  Signed: Marguerita Merles, DO [facility] 10/26/2023

## 2023-10-26 NOTE — Progress Notes (Signed)
PROGRESS NOTE    Linda Foley  HKV:[id number] DOB: [birth year] DOA: 10/24/2023 PCP: Pcp, No   Brief Narrative:  The patient is a 57 year old Caucasian female with a past medical history significant for but limited to anxiety, polysubstance abuse, tobacco abuse, COPD with chronic hypoxic respiratory failure with 4 L of baseline oxygen, other comorbidities who presented to the ED with progressively worsening shortness of breath associated with wheezing and dyspnea.  Recently she was seen at [facility] on 10/21/2003 left AMA.  She came to the hospital for shortness of breath and was given nebulizer, methylprednisone, magnesium and brought in for further evaluation.  Initially O2 requirements were significant had to be placed on BiPAP but was weaned off and then transferred from the stepdown unit to the progressive care floor.  Feels a little bit better with her shortness of breath and is improving slowly but has not had bowel movement.  Today on ambulation she did drop her saturation so we will stop her prednisone and start her back on IV Solu-Medrol for a day and continue to monitor.  Will need to repeat ambulatory home O2 screen in the AM.  Assessment and Plan:  COPD with Acute Exacerbation (HCC) Chronic respiratory failure with hypoxia on 4 L of supplemental oxygen via nasal cannula -Inpatient SDU -> PCU as she is clinically improved -Continuous pulse oximetry and maintain O2 saturation greater 90% -Continue supplemental oxygen via nasal cannula and wean O2 as tolerated -SpO2: 93 % O2 Flow Rate (L/min): 5 L/min FiO2 (%): 40 % -Chronically wears 4 L of supplemental oxygen via nasal cannula and was noted to have O2 sats in the 70s on her 4 L.  She was seen at [facility] and left AMA and was brought back with worsening shortness of breath -Given Methylprednisolone 125 mg IVP x1 and Followed by prednisone 40 mg p.o. daily however will discontinue the p.o. prednisone placed back  on IV Solu-Medrol 60 mg twice daily -RSV, Influenza A/B, and SARS CoV-2 PCR Negative  -Add Guaifenesin 1200 mg po BID, Flutter Valve and Incentive Spirometry -Scheduled Nebs with Xopenex/Atrovent, Brovana, and Budesonide Follow-up CBC and chemistry in the morning.  -C/w Azithromycin but increase to 500 mg p.o. daily mg po Daily -Respiratory virus panel is negative as well and will discontinue Droplet Precautions -She will need an ambulatory home O2 screen prior to discharge and repeat chest x-ray this a.m. showed hyperinflation with no acute findings    Tobacco Abuse -Tobacco cessation advised. -Nicotine replacement therapy ordered with Nicotine Patch 14 mg TD   Incontinence in female -This is a chronic issue. -Follow-up with PCP/GYN as an outpatient.   Erythrocytosis  -Secondary to COPD and smoking. -Hgb/Hct Trend: Recent Labs  Lab 10/21/23 2255 10/24/23 0127 10/25/23 0404 10/26/23 0425  HGB 16.7* 16.9* 15.4* 16.5*  HCT 51.9* 52.5* 47.7* 52.8*  MCV 96.1 97.9 99.0 100.4*  -Recommending Smoking cessation and COPD management optimization. -Needs to follow-up with pulmonary as an outpatient.   Hypocalcemia -Ca2+ Trend: Recent Labs  Lab 10/21/23 2255 10/24/23 0127 10/25/23 0404 10/26/23 0425  CALCIUM 8.7* 8.7* 8.7* 8.7*  -Corrected Ca2+ for Albumin was 9.3 -Continue to Monitor and Trend and Repeat CMP in the AM   Hyperglycemia -CBG and Glucose Trend: No results for input(s): "GLUCAP" in the last 720 hours. Recent Labs  Lab 10/21/23 2255 10/24/23 0127 10/25/23 0404 10/26/23 0425  GLUCOSE 117* 124* 118* 73    MRSA Carrier  -Treatment protocol started with Mupirocin Ointment  2% 1 application Nasal BID  Leukocytosis -In the setting of steroid to margination -WBC Trend: Recent Labs  Lab 10/21/23 2255 10/24/23 0127 10/25/23 0404 10/26/23 0425  WBC 7.4 9.4 12.2* 12.8*  -Continue to Monitor for S/Sx of Infection; Repeat CBC in the AM  Elevated BUN -In the  setting of Steroid Demargination -BUN Trend:  Recent Labs  Lab 10/21/23 2255 10/24/23 0127 10/25/23 0404 10/26/23 0425  BUN 12 15 22* 25*  -Continue to Monitor and Trend and repeat CMP in the AM  GERD/GI Prophylaxis -Start Pantoprazole 40 mg po Daily and add Maalox  Constipation -Start Senna-Docusate 1 tab po BID and Miralax 17 grams po BID -Also add Bisacodyl 10 mg RC Dailyprn Moderate Constipation  Hypoalbuminemia -Patient's Albumin Trend: Recent Labs  Lab 10/25/23 0404 10/26/23 0425  ALBUMIN 3.2* 3.6  -Continue to Monitor and Trend and repeat CMP in the AM   DVT prophylaxis: enoxaparin (LOVENOX) injection 30 mg Start: 10/24/23 1000    Code Status: Full Code Family Communication: No family present at beside  Disposition Plan:  Level of care: Progressive Status is: Inpatient Remains inpatient appropriate because: Needs further clinical improvement in Respiratory Status   Consultants:  None  Procedures:  As delineated as above  Antimicrobials:  Anti-infectives (From admission, onward)    Start     Dose/Rate Route Frequency Ordered Stop   10/27/23 1000  azithromycin (ZITHROMAX) tablet 500 mg       Note to Pharmacy: Adjust as needed please.TY   500 mg Oral Daily 10/26/23 1749 10/31/23 0959   10/25/23 2000  doxycycline (VIBRA-TABS) tablet 100 mg  Status:  Discontinued        100 mg Oral Every 12 hours 10/25/23 1858 10/25/23 1901   10/25/23 0800  azithromycin (ZITHROMAX) tablet 250 mg  Status:  Discontinued       Note to Pharmacy: Adjust as needed please.TY   250 mg Oral Daily 10/24/23 1119 10/26/23 1749   10/24/23 0345  azithromycin (ZITHROMAX) 500 mg in dextrose 5 % 250 mL IVPB  Status:  Discontinued        500 mg 250 mL/hr over 60 Minutes Intravenous Every 24 hours 10/24/23 0342 10/24/23 1119       Subjective: Seen and examined and she was doing a little bit better with her breathing however she desaturated again on ambulation.  Also complaining of being  constipated has not had a bowel movement over a week.  No nausea or vomiting.  No other concerns or complaints at this time.  Objective: Vitals:   10/26/23 0418 10/26/23 0808 10/26/23 0811 10/26/23 1354  BP: 120/79   106/71  Pulse: 85   73  Resp: 16   18  Temp: 97.8 F (36.6 C)   98.4 F (36.9 C)  TempSrc: Oral   Oral  SpO2: 95% 99% 99% 93%  Weight:      Height:        Intake/Output Summary (Last 24 hours) at 10/26/2023 1820 Last data filed at 10/26/2023 1300 Gross per 24 hour  Intake 770 ml  Output 1550 ml  Net -780 ml   Filed Weights   10/24/23 0550  Weight: 45.9 kg   Examination: Physical Exam:  Constitutional: Thin older appearing than her stated age Caucasian female Respiratory: Diminished to auscultation bilaterally with coarse breath sounds and has some wheezing but no rales, rhonchi or crackles. Normal respiratory effort and patient is not tachypenic. No accessory muscle use.  Wearing supplemental oxygen nasal cannula Cardiovascular:  RRR, no murmurs / rubs / gallops. S1 and S2 auscultated. No extremity edema Abdomen: Soft, non-tender, non-distended. Bowel sounds positive.  GU: Deferred. Musculoskeletal: No clubbing / cyanosis of digits/nails. No joint deformity upper and lower extremities.  Skin: No rashes, lesions, ulcers on a limited skin evaluation. No induration; Warm and dry.  Neurologic: CN 2-12 grossly intact with no focal deficits. Romberg sign and cerebellar reflexes not assessed.  Psychiatric: Normal judgment and insight. Alert and oriented x 3. Normal mood and appropriate affect.   Data Reviewed: I have personally reviewed following labs and imaging studies  CBC: Recent Labs  Lab 10/21/23 2255 10/24/23 0127 10/25/23 0404 10/26/23 0425  WBC 7.4 9.4 12.2* 12.8*  NEUTROABS 4.9 6.7  --  10.1*  HGB 16.7* 16.9* 15.4* 16.5*  HCT 51.9* 52.5* 47.7* 52.8*  MCV 96.1 97.9 99.0 100.4*  PLT 226 272 251 282   Basic Metabolic Panel: Recent Labs  Lab  10/21/23 2255 10/24/23 0127 10/25/23 0404 10/26/23 0425  NA 138 138 136 138  K 4.8 3.9 4.1 4.6  CL 101 100 98 106  CO2 26 31 30 27   GLUCOSE 117* 124* 118* 73  BUN 12 15 22* 25*  CREATININE 0.70 0.71 0.71 0.54  CALCIUM 8.7* 8.7* 8.7* 8.7*  MG  --   --   --  2.3  PHOS  --   --   --  3.7   GFR: Estimated Creatinine Clearance: 56.9 mL/min (by C-G formula based on SCr of 0.54 mg/dL). Liver Function Tests: Recent Labs  Lab 10/25/23 0404 10/26/23 0425  AST 16 14*  ALT 19 19  ALKPHOS 44 50  BILITOT 0.4 0.5  PROT 6.4* 7.4  ALBUMIN 3.2* 3.6   No results for input(s): "LIPASE", "AMYLASE" in the last 168 hours. No results for input(s): "AMMONIA" in the last 168 hours. Coagulation Profile: No results for input(s): "INR", "PROTIME" in the last 168 hours. Cardiac Enzymes: No results for input(s): "CKTOTAL", "CKMB", "CKMBINDEX", "TROPONINI" in the last 168 hours. BNP (last 3 results) No results for input(s): "PROBNP" in the last 8760 hours. HbA1C: No results for input(s): "HGBA1C" in the last 72 hours. CBG: No results for input(s): "GLUCAP" in the last 168 hours. Lipid Profile: No results for input(s): "CHOL", "HDL", "LDLCALC", "TRIG", "CHOLHDL", "LDLDIRECT" in the last 72 hours. Thyroid Function Tests: No results for input(s): "TSH", "T4TOTAL", "FREET4", "T3FREE", "THYROIDAB" in the last 72 hours. Anemia Panel: No results for input(s): "VITAMINB12", "FOLATE", "FERRITIN", "TIBC", "IRON", "RETICCTPCT" in the last 72 hours. Sepsis Labs: No results for input(s): "PROCALCITON", "LATICACIDVEN" in the last 168 hours.  Recent Results (from the past 240 hour(s))  Resp panel by RT-PCR (RSV, Flu A&B, Covid) Anterior Nasal Swab     Status: None   Collection Time: 10/21/23 10:40 PM   Specimen: Anterior Nasal Swab  Result Value Ref Range Status   SARS Coronavirus 2 by RT PCR NEGATIVE NEGATIVE Final   Influenza A by PCR NEGATIVE NEGATIVE Final   Influenza B by PCR NEGATIVE NEGATIVE Final     Comment: (NOTE) The Xpert Xpress SARS-CoV-2/FLU/RSV plus assay is intended as an aid in the diagnosis of influenza from Nasopharyngeal swab specimens and should not be used as a sole basis for treatment. Nasal washings and aspirates are unacceptable for Xpert Xpress SARS-CoV-2/FLU/RSV testing.  Fact Sheet for Patients: BloggerCourse.com  Fact Sheet for Healthcare Providers: SeriousBroker.it  This test is not yet approved or cleared by the [place] FDA and has been authorized for detection  and/or diagnosis of SARS-CoV-2 by FDA under an Emergency Use Authorization (EUA). This EUA will remain in effect (meaning this test can be used) for the duration of the COVID-19 declaration under Section 564(b)(1) of the Act, 21 U.S.C. section 360bbb-3(b)(1), unless the authorization is terminated or revoked.     Resp Syncytial Virus by PCR NEGATIVE NEGATIVE Final    Comment: (NOTE) Fact Sheet for Patients: BloggerCourse.com  Fact Sheet for Healthcare Providers: SeriousBroker.it  This test is not yet approved or cleared by the [place] FDA and has been authorized for detection and/or diagnosis of SARS-CoV-2 by FDA under an Emergency Use Authorization (EUA). This EUA will remain in effect (meaning this test can be used) for the duration of the COVID-19 declaration under Section 564(b)(1) of the Act, 21 U.S.C. section 360bbb-3(b)(1), unless the authorization is terminated or revoked.  Performed at [facility], [address] [place], [place] [address]   Resp panel by RT-PCR (RSV, Flu A&B, Covid) Anterior Nasal Swab     Status: None   Collection Time: 10/24/23  1:27 AM   Specimen: Anterior Nasal Swab  Result Value Ref Range Status   SARS Coronavirus 2 by RT PCR NEGATIVE NEGATIVE Final    Comment: (NOTE) SARS-CoV-2 target nucleic acids are NOT DETECTED.  The SARS-CoV-2  RNA is generally detectable in upper respiratory specimens during the acute phase of infection. The lowest concentration of SARS-CoV-2 viral copies this assay can detect is 138 copies/mL. A negative result does not preclude SARS-Cov-2 infection and should not be used as the sole basis for treatment or other patient management decisions. A negative result may occur with  improper specimen collection/handling, submission of specimen other than nasopharyngeal swab, presence of viral mutation(s) within the areas targeted by this assay, and inadequate number of viral copies(<138 copies/mL). A negative result must be combined with clinical observations, patient history, and epidemiological information. The expected result is Negative.  Fact Sheet for Patients:  BloggerCourse.com  Fact Sheet for Healthcare Providers:  SeriousBroker.it  This test is no t yet approved or cleared by the [place] FDA and  has been authorized for detection and/or diagnosis of SARS-CoV-2 by FDA under an Emergency Use Authorization (EUA). This EUA will remain  in effect (meaning this test can be used) for the duration of the COVID-19 declaration under Section 564(b)(1) of the Act, 21 U.S.C.section 360bbb-3(b)(1), unless the authorization is terminated  or revoked sooner.       Influenza A by PCR NEGATIVE NEGATIVE Final   Influenza B by PCR NEGATIVE NEGATIVE Final    Comment: (NOTE) The Xpert Xpress SARS-CoV-2/FLU/RSV plus assay is intended as an aid in the diagnosis of influenza from Nasopharyngeal swab specimens and should not be used as a sole basis for treatment. Nasal washings and aspirates are unacceptable for Xpert Xpress SARS-CoV-2/FLU/RSV testing.  Fact Sheet for Patients: BloggerCourse.com  Fact Sheet for Healthcare Providers: SeriousBroker.it  This test is not yet approved or cleared by the  [place] FDA and has been authorized for detection and/or diagnosis of SARS-CoV-2 by FDA under an Emergency Use Authorization (EUA). This EUA will remain in effect (meaning this test can be used) for the duration of the COVID-19 declaration under Section 564(b)(1) of the Act, 21 U.S.C. section 360bbb-3(b)(1), unless the authorization is terminated or revoked.     Resp Syncytial Virus by PCR NEGATIVE NEGATIVE Final    Comment: (NOTE) Fact Sheet for Patients: BloggerCourse.com  Fact Sheet for Healthcare Providers: SeriousBroker.it  This  test is not yet approved or cleared by the [place] and has been authorized for detection and/or diagnosis of SARS-CoV-2 by FDA under an Emergency Use Authorization (EUA). This EUA will remain in effect (meaning this test can be used) for the duration of the COVID-19 declaration under Section 564(b)(1) of the Act, 21 U.S.C. section 360bbb-3(b)(1), unless the authorization is terminated or revoked.  Performed at [facility], [address], [place], [place] [address]   Respiratory (~20 pathogens) panel by PCR     Status: None   Collection Time: 10/24/23  1:27 AM   Specimen: Nasopharyngeal Swab; Respiratory  Result Value Ref Range Status   Adenovirus NOT DETECTED NOT DETECTED Final   Coronavirus 229E NOT DETECTED NOT DETECTED Final    Comment: (NOTE) The Coronavirus on the Respiratory Panel, DOES NOT test for the novel  Coronavirus (2019 nCoV)    Coronavirus HKU1 NOT DETECTED NOT DETECTED Final   Coronavirus NL63 NOT DETECTED NOT DETECTED Final   Coronavirus OC43 NOT DETECTED NOT DETECTED Final   Metapneumovirus NOT DETECTED NOT DETECTED Final   Rhinovirus / Enterovirus NOT DETECTED NOT DETECTED Final   Influenza A NOT DETECTED NOT DETECTED Final   Influenza B NOT DETECTED NOT DETECTED Final   Parainfluenza Virus 1 NOT DETECTED NOT DETECTED Final    Parainfluenza Virus 2 NOT DETECTED NOT DETECTED Final   Parainfluenza Virus 3 NOT DETECTED NOT DETECTED Final   Parainfluenza Virus 4 NOT DETECTED NOT DETECTED Final   Respiratory Syncytial Virus NOT DETECTED NOT DETECTED Final   Bordetella pertussis NOT DETECTED NOT DETECTED Final   Bordetella Parapertussis NOT DETECTED NOT DETECTED Final   Chlamydophila pneumoniae NOT DETECTED NOT DETECTED Final   Mycoplasma pneumoniae NOT DETECTED NOT DETECTED Final    Comment: Performed at [facility], [address] [place], [place] [address]  MRSA Next Gen by PCR, Nasal     Status: Abnormal   Collection Time: 10/24/23  5:45 AM   Specimen: Nasal Mucosa; Nasal Swab  Result Value Ref Range Status   MRSA by PCR Next Gen DETECTED (A) NOT DETECTED Final    Comment: RESULT CALLED TO, READ BACK BY AND VERIFIED WITH: KOONTZ, A. RN AT [phone] ON 10/24/2023 BY MECIAL J. (NOTE) The GeneXpert MRSA Assay (FDA approved for NASAL specimens only), is one component of a comprehensive MRSA colonization surveillance program. It is not intended to diagnose MRSA infection nor to guide or monitor treatment for MRSA infections. Test performance is not FDA approved in patients less than 15 years old. Performed at [facility], [address], [place], [place] [address]     Radiology Studies: DG CHEST PORT 1 VIEW  Result Date: 10/26/2023 CLINICAL DATA:  Shortness of breath EXAM: PORTABLE CHEST - 1 VIEW COMPARISON:  10/24/2023 FINDINGS: Lungs clear, hyperinflated. Heart size and mediastinal contours are within normal limits. No effusion. Visualized bones unremarkable. IMPRESSION: Hyperinflation. No acute findings. Electronically Signed   By: Corlis Leak M.D.   On: 10/26/2023 09:15    Scheduled Meds:  arformoterol  15 mcg Nebulization BID   [START ON 10/27/2023] azithromycin  500 mg Oral Daily   budesonide (PULMICORT) nebulizer solution  0.25 mg Nebulization BID   Chlorhexidine Gluconate Cloth  6  each Topical Daily   enoxaparin (LOVENOX) injection  30 mg Subcutaneous Q24H   feeding supplement  237 mL Oral BID BM   guaiFENesin  1,200 mg Oral BID   influenza vac split trivalent PF  0.5 mL Intramuscular Tomorrow-1000   ipratropium  0.5 mg Nebulization BID   levalbuterol  0.63 mg Nebulization BID   methylPREDNISolone (SOLU-MEDROL) injection  60 mg Intravenous Q12H   mupirocin ointment  1 Application Nasal BID   nicotine  14 mg Transdermal Daily   pantoprazole  40 mg Oral Daily   pneumococcal 20-valent conjugate vaccine  0.5 mL Intramuscular Tomorrow-1000   polyethylene glycol  17 g Oral BID   senna-docusate  1 tablet Oral BID   Continuous Infusions:   LOS: 2 days   Marguerita Merles, DO [facility] Available via Epic secure chat 7am-7pm After these hours, please refer to coverage provider listed on [email or web address] 10/26/2023, 6:20 PM

## 2023-10-27 DIAGNOSIS — J449 Chronic obstructive pulmonary disease, unspecified: Secondary | ICD-10-CM | POA: Diagnosis not present

## 2023-10-27 DIAGNOSIS — J441 Chronic obstructive pulmonary disease with (acute) exacerbation: Secondary | ICD-10-CM | POA: Diagnosis not present

## 2023-10-27 DIAGNOSIS — Z720 Tobacco use: Secondary | ICD-10-CM | POA: Diagnosis not present

## 2023-10-27 DIAGNOSIS — F1910 Other psychoactive substance abuse, uncomplicated: Secondary | ICD-10-CM | POA: Diagnosis not present

## 2023-11-07 ENCOUNTER — Emergency Department (HOSPITAL_COMMUNITY)
Admission: EM | Admit: 2023-11-07 | Discharge: 2023-11-07 | Disposition: A | Discharge status: Home / Self Care | Payer: 59

## 2023-11-07 ENCOUNTER — Emergency Department (HOSPITAL_COMMUNITY): Payer: 59

## 2023-11-07 ENCOUNTER — Encounter (HOSPITAL_COMMUNITY): Payer: Self-pay

## 2023-11-07 ENCOUNTER — Other Ambulatory Visit: Payer: Self-pay

## 2023-11-07 DIAGNOSIS — R0689 Other abnormalities of breathing: Secondary | ICD-10-CM | POA: Diagnosis not present

## 2023-11-07 DIAGNOSIS — Z1152 Encounter for screening for COVID-19: Secondary | ICD-10-CM | POA: Diagnosis not present

## 2023-11-07 DIAGNOSIS — R000 Tachycardia, unspecified: Secondary | ICD-10-CM | POA: Diagnosis not present

## 2023-11-07 DIAGNOSIS — R0602 Shortness of breath: Secondary | ICD-10-CM | POA: Diagnosis not present

## 2023-11-07 DIAGNOSIS — I10 Essential (primary) hypertension: Secondary | ICD-10-CM | POA: Diagnosis not present

## 2023-11-07 DIAGNOSIS — J449 Chronic obstructive pulmonary disease, unspecified: Secondary | ICD-10-CM | POA: Diagnosis not present

## 2023-11-07 DIAGNOSIS — J441 Chronic obstructive pulmonary disease with (acute) exacerbation: Secondary | ICD-10-CM | POA: Diagnosis not present

## 2023-11-07 DIAGNOSIS — R0902 Hypoxemia: Secondary | ICD-10-CM | POA: Diagnosis not present

## 2023-11-07 DIAGNOSIS — Z743 Need for continuous supervision: Secondary | ICD-10-CM | POA: Diagnosis not present

## 2023-11-07 LAB — BASIC METABOLIC PANEL
Anion gap: 8 (ref 5–15)
BUN: 14 mg/dL (ref 6–20)
CO2: 27 mmol/L (ref 22–32)
Calcium: 8.3 mg/dL — ABNORMAL LOW (ref 8.9–10.3)
Chloride: 107 mmol/L (ref 98–111)
Creatinine, Ser: 0.65 mg/dL (ref 0.44–1.00)
GFR, Estimated: >60 mL/min (ref >60)
Glucose, Bld: 130 mg/dL — ABNORMAL HIGH (ref 70–99)
Potassium: 3.7 mmol/L (ref 3.5–5.1)
Sodium: 142 mmol/L (ref 135–145)

## 2023-11-07 LAB — RESP PANEL BY RT-PCR (RSV, FLU A&B, COVID)  RVPGX2
Influenza A by PCR: NEGATIVE
Influenza B by PCR: NEGATIVE
Resp Syncytial Virus by PCR: NEGATIVE
SARS Coronavirus 2 by RT PCR: NEGATIVE

## 2023-11-07 LAB — CBC
HCT: 45.6 % (ref 36.0–46.0)
Hemoglobin: 14.3 g/dL (ref 12.0–15.0)
MCH: 31.6 pg (ref 26.0–34.0)
MCHC: 31.4 g/dL (ref 30.0–36.0)
MCV: 100.7 fL — ABNORMAL HIGH (ref 80.0–100.0)
Platelets: 223 10*3/uL (ref 150–400)
RBC: 4.53 MIL/uL (ref 3.87–5.11)
RDW: 12.8 % (ref 11.5–15.5)
WBC: 14.3 10*3/uL — ABNORMAL HIGH (ref 4.0–10.5)
nRBC: 0 % (ref 0.0–0.2)

## 2023-11-07 MED ORDER — AZITHROMYCIN 250 MG PO TABS
500.0000 mg | ORAL_TABLET | Freq: Once | ORAL | Status: AC
  Administered 2023-11-07: 500 mg via ORAL
  Filled 2023-11-07: qty 2

## 2023-11-07 MED ORDER — ALBUTEROL SULFATE HFA 108 (90 BASE) MCG/ACT IN AERS: 1.0000 | INHALATION_SPRAY | Freq: Four times a day (QID) | RESPIRATORY_TRACT | 0 refills | Status: DC | PRN

## 2023-11-07 MED ORDER — ALBUTEROL SULFATE (2.5 MG/3ML) 0.083% IN NEBU: 2.5000 mg | INHALATION_SOLUTION | Freq: Four times a day (QID) | RESPIRATORY_TRACT | 12 refills | Status: DC | PRN

## 2023-11-07 MED ORDER — IPRATROPIUM-ALBUTEROL 0.5-2.5 (3) MG/3ML IN SOLN
RESPIRATORY_TRACT | Status: AC
  Filled 2023-11-07: qty 3

## 2023-11-07 MED ORDER — PREDNISONE 50 MG PO TABS: ORAL_TABLET | ORAL | 0 refills | Status: DC

## 2023-11-07 MED ORDER — AZITHROMYCIN 250 MG PO TABS: 250.0000 mg | ORAL_TABLET | Freq: Every day | ORAL | 0 refills | Status: DC

## 2023-11-07 MED ORDER — IPRATROPIUM-ALBUTEROL 0.5-2.5 (3) MG/3ML IN SOLN
3.0000 mL | Freq: Once | RESPIRATORY_TRACT | Status: AC
  Administered 2023-11-07: 3 mL via RESPIRATORY_TRACT
  Filled 2023-11-07: qty 3

## 2023-11-07 NOTE — ED Triage Notes (Signed)
Patient brought in by EMS due to shortness of breath X 1 day. Patient has a history of COPD and emphysema. Pt wears O2 6L at home. EMS given duo neb in route and dose of albuterol, 125mg  solu medrol and 2gm magnesium.

## 2023-11-07 NOTE — ED Provider Notes (Signed)
Received patient in turnover from Dr. Rhae Hammock.  Please see their note for further details of Hx, PE.  Briefly patient is a 57 y.o. female with a Shortness of Breath .  Patient with likely COPD exacerbation.  Offered admission patient electing to go home.  Plan for D/c post labs, cxr.  CBC with leukocytosis, no anemia.  Chest x-ray independently interpreted by me without focal infiltration or ptx.  Patient still would prefer to go home.    Melene Plan, DO 11/07/23 1726

## 2023-11-07 NOTE — Discharge Instructions (Addendum)
Please take the prednisone and azithromycin as prescribed.  Use 2 puffs of the albuterol over the next 24 to 48 hours.  Follow-up with your doctor.  Return to the ER for worsening symptoms.

## 2023-11-07 NOTE — ED Provider Notes (Signed)
[place] EMERGENCY DEPARTMENT AT [facility] Provider Note   CSN: [id number] Arrival date & time: 11/07/23  1255     History  Chief Complaint  Patient presents with   Shortness of Breath    Linda Foley is a 57 y.o. female.  57 year old female with past medical history of polycythemia vera and COPD presenting to the emergency department today with cough and shortness of breath.  The patient states that she has been having a cough productive of thick yellow sputum now over the past few days.  She denies any hemoptysis.  States she is having some chest discomfort with coughing.  She states that she has been short of breath.  She states that since she is left the hospital last week that she has been using 6 L nasal cannula at home.  She denies any leg pain or swelling.  Denies any fevers but has had some chills.  She also reports some nasal congestion.   Shortness of Breath Associated symptoms: cough        Home Medications Prior to Admission medications   Medication Sig Start Date End Date Taking? Authorizing Provider  albuterol (PROVENTIL) (2.5 MG/3ML) 0.083% nebulizer solution Take 3 mLs (2.5 mg total) by nebulization every 6 (six) hours as needed for wheezing or shortness of breath. 11/07/23  Yes Durwin Glaze, MD  albuterol (VENTOLIN HFA) 108 (90 Base) MCG/ACT inhaler Inhale 1-2 puffs into the lungs every 6 (six) hours as needed for wheezing or shortness of breath. 11/07/23  Yes Durwin Glaze, MD  azithromycin (ZITHROMAX) 250 MG tablet Take 1 tablet (250 mg total) by mouth daily. Take 1 tablet by mouth daily (first dose in ER) 11/07/23  Yes Durwin Glaze, MD  predniSONE (DELTASONE) 50 MG tablet Take 1 tab by mouth daily 11/07/23  Yes Durwin Glaze, MD      Allergies    Penicillins    Review of Systems   Review of Systems  Respiratory:  Positive for cough and shortness of breath.   All other systems reviewed and are negative.   Physical Exam Updated  Vital Signs BP 118/73   Pulse 86   Temp 97.9 F (36.6 C) (Oral)   Resp 17   Ht 5\' 2"  (1.575 m)   Wt 45.9 kg   LMP 12/12/2011   SpO2 93%   BMI 18.51 kg/m  Physical Exam Vitals and nursing note reviewed.   Gen: Coughing intermittently throughout interview, mild conversational dyspnea noted Eyes: PERRL, EOMI HEENT: no oropharyngeal swelling Neck: trachea midline Resp: Diminished with diffuse wheezes throughout all lung fields Card: RRR, no murmurs, rubs, or gallops Abd: nontender, nondistended Extremities: no calf tenderness, no edema Vascular: 2+ radial pulses bilaterally, 2+ DP pulses bilaterally Skin: no rashes Psyc: acting appropriately   ED Results / Procedures / Treatments   Labs (all labs ordered are listed, but only abnormal results are displayed) Labs Reviewed  BASIC METABOLIC PANEL - Abnormal; Notable for the following components:      Result Value   Glucose, Bld 130 (*)    Calcium 8.3 (*)    All other components within normal limits  RESP PANEL BY RT-PCR (RSV, FLU A&B, COVID)  [id number]  CBC    EKG EKG Interpretation Date/Time:  Friday November 07 2023 14:03:58 EST Ventricular Rate:  88 PR Interval:  158 QRS Duration:  88 QT Interval:  375 QTC Calculation: 454 R Axis:   80  Text Interpretation: Sinus rhythm Anterior infarct,  old Confirmed by Beckey Downing [phone]) on 11/07/2023 3:46:43 PM  Radiology No results found.  Procedures Procedures    Medications Ordered in ED Medications  ipratropium-albuterol (DUONEB) 0.5-2.5 (3) MG/3ML nebulizer solution 3 mL (3 mLs Nebulization Not Given 11/07/23 1430)  ipratropium-albuterol (DUONEB) 0.5-2.5 (3) MG/3ML nebulizer solution 3 mL (3 mLs Nebulization Given 11/07/23 1350)  azithromycin (ZITHROMAX) tablet 500 mg (500 mg Oral Given 11/07/23 1350)    ED Course/ Medical Decision Making/ A&P                                 Medical Decision Making 57 year old female with past medical history of polycythemia,  tobacco abuse, and COPD presents emergency department today with cough and shortness of breath.  The patient received Solu-Medrol and magnesium from medics on the way to the emergency department as well as a DuoNeb.  I will give the patient 2 additional DuoNeb's here.  Will obtain a chest x-ray as well as basic labs to evaluate for anemia, pulmonary edema, pulmonary infiltrates, or pneumothorax.  I will obtain RSV/COVID/flu swab on the patient here.  I will give her azithromycin as well given her underlying COPD.  I will reevaluate for ultimate disposition.  Currently her pulse ox is in the mid 90s on 6 L which is what she left the hospital with during her most recent admission.  I think that if she remains stable she may be able to be discharged with symptomatic treatment.  Will also obtain an EKG to screen for cardiac etiologies for her shortness of breath or chest, but suspicion for this is relatively low given her exam and history.  The patient is feeling better on reassessment.  Her chest x-ray interpreted by me shows no acute infiltrates, no pulmonary edema, no pneumothorax.  EKG does not show any concerning ischemic findings.  The patient is stable on 4 L nasal cannula at 94 to 95%.  We did discuss admission versus going home with outpatient follow-up.  Patient would like to be discharged.  I think this is reasonable.  She will be discharged with return precautions.    Amount and/or Complexity of Data Reviewed Labs: ordered. Radiology: ordered.  Risk Prescription drug management.           Final Clinical Impression(s) / ED Diagnoses Final diagnoses:  COPD exacerbation (HCC)    Rx / DC Orders ED Discharge Orders          Ordered    predniSONE (DELTASONE) 50 MG tablet        11/07/23 1506    azithromycin (ZITHROMAX) 250 MG tablet  Daily        11/07/23 1506    albuterol (VENTOLIN HFA) 108 (90 Base) MCG/ACT inhaler  Every 6 hours PRN        11/07/23 1506    albuterol  (PROVENTIL) (2.5 MG/3ML) 0.083% nebulizer solution  Every 6 hours PRN        11/07/23 1526              Durwin Glaze, MD 11/07/23 1547

## 2023-11-26 DIAGNOSIS — Z720 Tobacco use: Secondary | ICD-10-CM | POA: Diagnosis not present

## 2023-11-26 DIAGNOSIS — F1910 Other psychoactive substance abuse, uncomplicated: Secondary | ICD-10-CM | POA: Diagnosis not present

## 2023-11-26 DIAGNOSIS — J449 Chronic obstructive pulmonary disease, unspecified: Secondary | ICD-10-CM | POA: Diagnosis not present

## 2023-11-26 DIAGNOSIS — J441 Chronic obstructive pulmonary disease with (acute) exacerbation: Secondary | ICD-10-CM | POA: Diagnosis not present

## 2023-11-27 ENCOUNTER — Telehealth: Payer: Self-pay

## 2023-11-27 NOTE — Telephone Encounter (Signed)
Transition Care Management Follow-up Telephone Call Date of discharge and from where: [facility] 11/15 How have you been since you were released from the hospital? Patient isnt doing well and has not followed up with providers  Any questions or concerns? No  Items Reviewed: Did the pt receive and understand the discharge instructions provided? Yes  Medications obtained and verified? Yes Other? No  Any new allergies since your discharge? No  Dietary orders reviewed? No Do you have support at home? No     Follow up appointments reviewed:  PCP Hospital f/u appt confirmed? No  Scheduled to see  on  @ . Specialist Hospital f/u appt confirmed? No  Scheduled to see  on  @ . Are transportation arrangements needed?No If their condition worsens, is the pt aware to call PCP or go to the Emergency Dept.? Yes Was the patient provided with contact information for the PCP's office or ED? Yes Was to pt encouraged to call back with questions or concerns? Yes

## 2023-11-28 ENCOUNTER — Observation Stay (HOSPITAL_COMMUNITY)
Admission: EM | Admit: 2023-11-28 | Discharge: 2023-11-28 | Discharge status: Against Medical Advice | Payer: 59 | Attending: Internal Medicine | Admitting: Internal Medicine

## 2023-11-28 ENCOUNTER — Encounter (HOSPITAL_COMMUNITY): Payer: Self-pay

## 2023-11-28 ENCOUNTER — Emergency Department (HOSPITAL_COMMUNITY): Payer: 59

## 2023-11-28 ENCOUNTER — Other Ambulatory Visit: Payer: Self-pay

## 2023-11-28 DIAGNOSIS — Z5321 Procedure and treatment not carried out due to patient leaving prior to being seen by health care provider: Secondary | ICD-10-CM | POA: Diagnosis not present

## 2023-11-28 DIAGNOSIS — K219 Gastro-esophageal reflux disease without esophagitis: Secondary | ICD-10-CM | POA: Diagnosis not present

## 2023-11-28 DIAGNOSIS — Z7951 Long term (current) use of inhaled steroids: Secondary | ICD-10-CM | POA: Diagnosis not present

## 2023-11-28 DIAGNOSIS — J441 Chronic obstructive pulmonary disease with (acute) exacerbation: Principal | ICD-10-CM | POA: Diagnosis present

## 2023-11-28 DIAGNOSIS — F17210 Nicotine dependence, cigarettes, uncomplicated: Secondary | ICD-10-CM | POA: Diagnosis not present

## 2023-11-28 DIAGNOSIS — J9611 Chronic respiratory failure with hypoxia: Secondary | ICD-10-CM | POA: Diagnosis not present

## 2023-11-28 DIAGNOSIS — Z1152 Encounter for screening for COVID-19: Secondary | ICD-10-CM | POA: Insufficient documentation

## 2023-11-28 DIAGNOSIS — K5900 Constipation, unspecified: Secondary | ICD-10-CM | POA: Diagnosis not present

## 2023-11-28 DIAGNOSIS — Z79899 Other long term (current) drug therapy: Secondary | ICD-10-CM | POA: Insufficient documentation

## 2023-11-28 DIAGNOSIS — R0602 Shortness of breath: Secondary | ICD-10-CM | POA: Diagnosis not present

## 2023-11-28 DIAGNOSIS — J9811 Atelectasis: Secondary | ICD-10-CM | POA: Diagnosis not present

## 2023-11-28 LAB — CBC WITH DIFFERENTIAL/PLATELET
Abs Immature Granulocytes: 0.02 10*3/uL (ref 0.00–0.07)
Basophils Absolute: 0.1 10*3/uL (ref 0.0–0.1)
Basophils Relative: 1 %
Eosinophils Absolute: 0.2 10*3/uL (ref 0.0–0.5)
Eosinophils Relative: 3 %
HCT: 46.8 % — ABNORMAL HIGH (ref 36.0–46.0)
Hemoglobin: 14.8 g/dL (ref 12.0–15.0)
Immature Granulocytes: 0 %
Lymphocytes Relative: 16 %
Lymphs Abs: 1.1 10*3/uL (ref 0.7–4.0)
MCH: 31.7 pg (ref 26.0–34.0)
MCHC: 31.6 g/dL (ref 30.0–36.0)
MCV: 100.2 fL — ABNORMAL HIGH (ref 80.0–100.0)
Monocytes Absolute: 0.3 10*3/uL (ref 0.1–1.0)
Monocytes Relative: 5 %
Neutro Abs: 5.2 10*3/uL (ref 1.7–7.7)
Neutrophils Relative %: 75 %
Platelets: 219 10*3/uL (ref 150–400)
RBC: 4.67 MIL/uL (ref 3.87–5.11)
RDW: 13.2 % (ref 11.5–15.5)
WBC: 6.9 10*3/uL (ref 4.0–10.5)
nRBC: 0 % (ref 0.0–0.2)

## 2023-11-28 LAB — BASIC METABOLIC PANEL
Anion gap: 10 (ref 5–15)
BUN: 17 mg/dL (ref 6–20)
CO2: 29 mmol/L (ref 22–32)
Calcium: 8.5 mg/dL — ABNORMAL LOW (ref 8.9–10.3)
Chloride: 102 mmol/L (ref 98–111)
Creatinine, Ser: 0.59 mg/dL (ref 0.44–1.00)
GFR, Estimated: >60 mL/min (ref >60)
Glucose, Bld: 141 mg/dL — ABNORMAL HIGH (ref 70–99)
Potassium: 3.7 mmol/L (ref 3.5–5.1)
Sodium: 141 mmol/L (ref 135–145)

## 2023-11-28 LAB — RESP PANEL BY RT-PCR (RSV, FLU A&B, COVID)  RVPGX2
Influenza A by PCR: NEGATIVE
Influenza B by PCR: NEGATIVE
Resp Syncytial Virus by PCR: NEGATIVE
SARS Coronavirus 2 by RT PCR: NEGATIVE

## 2023-11-28 LAB — BLOOD GAS, VENOUS
Acid-Base Excess: 3.9 mmol/L — ABNORMAL HIGH (ref 0.0–2.0)
Bicarbonate: 32.9 mmol/L — ABNORMAL HIGH (ref 20.0–28.0)
Drawn by: 69663
O2 Saturation: 79.8 %
Patient temperature: 37
pCO2, Ven: 70 mm[Hg] — ABNORMAL HIGH (ref 44–60)
pH, Ven: 7.28 (ref 7.25–7.43)
pO2, Ven: 47 mm[Hg] — ABNORMAL HIGH (ref 32–45)

## 2023-11-28 MED ORDER — ONDANSETRON HCL 4 MG/2ML IJ SOLN
4.0000 mg | Freq: Once | INTRAMUSCULAR | Status: AC
  Administered 2023-11-28: 4 mg via INTRAVENOUS
  Filled 2023-11-28: qty 2

## 2023-11-28 MED ORDER — LORAZEPAM 2 MG/ML IJ SOLN
1.0000 mg | Freq: Once | INTRAMUSCULAR | Status: AC
  Administered 2023-11-28: 1 mg via INTRAVENOUS
  Filled 2023-11-28: qty 1

## 2023-11-28 MED ORDER — ONDANSETRON HCL 4 MG/2ML IJ SOLN
4.0000 mg | Freq: Four times a day (QID) | INTRAMUSCULAR | Status: DC | PRN
  Administered 2023-11-28: 4 mg via INTRAVENOUS
  Filled 2023-11-28: qty 2

## 2023-11-28 MED ORDER — TRAZODONE HCL 50 MG PO TABS: 25.0000 mg | ORAL_TABLET | Freq: Every evening | ORAL | Status: DC | PRN

## 2023-11-28 MED ORDER — METHYLPREDNISOLONE SODIUM SUCC 40 MG IJ SOLR
40.0000 mg | Freq: Two times a day (BID) | INTRAMUSCULAR | Status: DC
  Administered 2023-11-28 (×2): 40 mg via INTRAVENOUS
  Filled 2023-11-28 (×2): qty 1

## 2023-11-28 MED ORDER — NICOTINE 14 MG/24HR TD PT24
14.0000 mg | MEDICATED_PATCH | Freq: Every day | TRANSDERMAL | Status: DC
  Administered 2023-11-28: 14 mg via TRANSDERMAL
  Filled 2023-11-28: qty 1

## 2023-11-28 MED ORDER — ENOXAPARIN SODIUM 40 MG/0.4ML IJ SOSY
40.0000 mg | PREFILLED_SYRINGE | INTRAMUSCULAR | Status: DC
Start: 2023-11-29 — End: 2023-11-29

## 2023-11-28 MED ORDER — IPRATROPIUM-ALBUTEROL 0.5-2.5 (3) MG/3ML IN SOLN
3.0000 mL | Freq: Four times a day (QID) | RESPIRATORY_TRACT | Status: DC
  Administered 2023-11-28 (×4): 3 mL via RESPIRATORY_TRACT
  Filled 2023-11-28 (×4): qty 3

## 2023-11-28 MED ORDER — ALBUTEROL SULFATE (2.5 MG/3ML) 0.083% IN NEBU
2.5000 mg | INHALATION_SOLUTION | RESPIRATORY_TRACT | Status: DC | PRN
  Filled 2023-11-28: qty 3

## 2023-11-28 MED ORDER — SENNOSIDES-DOCUSATE SODIUM 8.6-50 MG PO TABS
1.0000 | ORAL_TABLET | Freq: Two times a day (BID) | ORAL | Status: DC
  Administered 2023-11-28: 1 via ORAL
  Filled 2023-11-28: qty 1

## 2023-11-28 MED ORDER — ACETAMINOPHEN 325 MG PO TABS: 650.0000 mg | ORAL_TABLET | Freq: Four times a day (QID) | ORAL | Status: DC | PRN

## 2023-11-28 MED ORDER — ALBUTEROL SULFATE (2.5 MG/3ML) 0.083% IN NEBU
10.0000 mg/h | INHALATION_SOLUTION | RESPIRATORY_TRACT | Status: DC
  Administered 2023-11-28: 10 mg/h via RESPIRATORY_TRACT
  Filled 2023-11-28: qty 12

## 2023-11-28 MED ORDER — ACETAMINOPHEN 650 MG RE SUPP: 650.0000 mg | Freq: Four times a day (QID) | RECTAL | Status: DC | PRN

## 2023-11-28 MED ORDER — POLYETHYLENE GLYCOL 3350 17 G PO PACK: 17.0000 g | PACK | Freq: Every day | ORAL | Status: DC | PRN

## 2023-11-28 MED ORDER — ONDANSETRON HCL 4 MG PO TABS: 4.0000 mg | ORAL_TABLET | Freq: Four times a day (QID) | ORAL | Status: DC | PRN

## 2023-11-28 MED ORDER — PANTOPRAZOLE SODIUM 40 MG PO TBEC
40.0000 mg | DELAYED_RELEASE_TABLET | Freq: Every day | ORAL | Status: DC
  Administered 2023-11-28: 40 mg via ORAL
  Filled 2023-11-28: qty 1

## 2023-11-28 MED ORDER — ENOXAPARIN SODIUM 30 MG/0.3ML IJ SOSY
30.0000 mg | PREFILLED_SYRINGE | INTRAMUSCULAR | Status: DC
  Administered 2023-11-28: 30 mg via SUBCUTANEOUS
  Filled 2023-11-28: qty 0.3

## 2023-11-28 NOTE — Progress Notes (Signed)
   11/28/23 0413  BiPAP/CPAP/SIPAP  BiPAP/CPAP/SIPAP Pt Type Adult  BiPAP/CPAP/SIPAP V60  Mask Type Full face mask  Mask Size Medium  Set Rate 14 breaths/min  Respiratory Rate 14 breaths/min  IPAP 12 cmH20  EPAP 6 cmH2O  FiO2 (%) 35 %  Flow Rate 0 lpm  Minute Ventilation 5.4  Leak 0  Peak Inspiratory Pressure (PIP) 13  Tidal Volume (Vt) 369  Patient Home Equipment No  Auto Titrate No  Press High Alarm 30 cmH2O  Press Low Alarm 5 cmH2O

## 2023-11-28 NOTE — Progress Notes (Addendum)
Spoke with charge and NP Virgel Manifold regarding patient wanting to leave AMA. Chavez at bedside to evaluate and advise patient of risks of leaving AMA. Still wants to leave. Telemetry box removed by patient. IV site discontinued after [facility] paperwork signed. Profanity used when informed that patient would not be be escorted out in wheelchair per staff. Did advise that visitor could get a wheelchair to take patient out, which he did.  Patient and visitor left without incident.

## 2023-11-28 NOTE — Progress Notes (Signed)
    Against Medical Advice   RN reports patient is planning to leave against medical advice. She is A/O x4 and capable of making her own medical decisions. She has a visitor at bedside who will be taking her home. Patient has been warned that this is not medically advisable at this time.   I spoke with patient at beside and tried to convince her to remain hospitalized as she is oxygen dependent and is being treated for COPD exacerbation. She states she has "some oxygen saved up" at home that she plans to use tonight. Patient states that she needs to leave to deal with personal matter and to take care for her dog.   Linda Foley has been educated several times that leaving against medical advice could result in medical complications like Death and Disability.  Patient understands and accepts the risks involved and assumes full responsibilty of this decision. Despite educating the patient, she expresses desire to leave the hospital immediately.   This patient has also been advised that if they feel the need for further medical assistance to return to any available ER or dial 9-1-1.  Informed by Nursing staff that this patient has left care and has signed the form  Against Medical Advice on 11/28/2023 at 2041.    Anthoney Harada, DNP, ACNPC- AG [facility]

## 2023-11-28 NOTE — Plan of Care (Signed)

## 2023-11-28 NOTE — ED Notes (Signed)
ED TO INPATIENT HANDOFF REPORT  Name/Age/Gender Linda Foley 57 y.o. female  Code Status    Code Status Orders  (From admission, onward)           Start     Ordered   11/28/23 0813  Full code  Continuous       Question:  By:  Answer:  Consent: discussion documented in EHR   11/28/23 0814           Code Status History     Date Active Date Inactive Code Status Order ID Comments User Context   10/24/2023 0757 10/26/2023 2333 Full Code [id number]  Bobette Mo, MD Inpatient   02/21/2022 1046 02/23/2022 2008 Full Code [id number]  Teddy Spike, DO Inpatient   06/20/2018 0403 06/21/2018 1521 Full Code [id number]  Hillary Bow, DO Inpatient       Home/SNF/Other Home  Chief Complaint COPD with acute exacerbation (HCC) [J44.1]  Level of Care/Admitting Diagnosis ED Disposition     ED Disposition  Admit   Condition  --   Comment  Hospital Area: [facility] [[id number]]  Level of Care: Progressive [102]  Admit to Progressive based on following criteria: RESPIRATORY PROBLEMS hypoxemic/hypercapnic respiratory failure that is responsive to NIPPV (BiPAP) or High Flow Nasal Cannula (6-80 lpm). Frequent assessment/intervention, no > Q2 hrs < Q4 hrs, to maintain oxygenation and pulmonary hygiene.  May place patient in observation at [facility] or [facility] if equivalent level of care is available:: Yes  Covid Evaluation: Confirmed COVID Negative  Diagnosis: COPD with acute exacerbation [facility]) [[id number]]  Admitting Physician: Maryln Gottron [[id number]]  Attending Physician: Kirby Crigler, MIR Jaxson.Roy [[id number]]          Medical History Past Medical History:  Diagnosis Date   Anxiety    COPD (chronic obstructive pulmonary disease) (HCC)    Polysubstance abuse (HCC)     Allergies Allergies  Allergen Reactions   Penicillins Nausea And Vomiting    IV Location/Drains/Wounds Patient Lines/Drains/Airways Status     Active Line/Drains/Airways     Name  Placement date Placement time Site Days   Peripheral IV 11/28/23 20 G Anterior;Right Forearm 11/28/23  0100  Forearm  less than 1            Labs/Imaging Results for orders placed or performed during the hospital encounter of 11/28/23 (from the past 48 hour(s))  Resp panel by RT-PCR (RSV, Flu A&B, Covid) Anterior Nasal Swab     Status: None   Collection Time: 11/28/23  1:43 AM   Specimen: Anterior Nasal Swab  Result Value Ref Range   SARS Coronavirus 2 by RT PCR NEGATIVE NEGATIVE    Comment: (NOTE) SARS-CoV-2 target nucleic acids are NOT DETECTED.  The SARS-CoV-2 RNA is generally detectable in upper respiratory specimens during the acute phase of infection. The lowest concentration of SARS-CoV-2 viral copies this assay can detect is 138 copies/mL. A negative result does not preclude SARS-Cov-2 infection and should not be used as the sole basis for treatment or other patient management decisions. A negative result may occur with  improper specimen collection/handling, submission of specimen other than nasopharyngeal swab, presence of viral mutation(s) within the areas targeted by this assay, and inadequate number of viral copies(<138 copies/mL). A negative result must be combined with clinical observations, patient history, and epidemiological information. The expected result is Negative.  Fact Sheet for Patients:  BloggerCourse.com  Fact Sheet for Healthcare Providers:  SeriousBroker.it  This test is no t  yet approved or cleared by the [place] and  has been authorized for detection and/or diagnosis of SARS-CoV-2 by FDA under an Emergency Use Authorization (EUA). This EUA will remain  in effect (meaning this test can be used) for the duration of the COVID-19 declaration under Section 564(b)(1) of the Act, 21 U.S.C.section 360bbb-3(b)(1), unless the authorization is terminated  or revoked sooner.       Influenza  A by PCR NEGATIVE NEGATIVE   Influenza B by PCR NEGATIVE NEGATIVE    Comment: (NOTE) The Xpert Xpress SARS-CoV-2/FLU/RSV plus assay is intended as an aid in the diagnosis of influenza from Nasopharyngeal swab specimens and should not be used as a sole basis for treatment. Nasal washings and aspirates are unacceptable for Xpert Xpress SARS-CoV-2/FLU/RSV testing.  Fact Sheet for Patients: BloggerCourse.com  Fact Sheet for Healthcare Providers: SeriousBroker.it  This test is not yet approved or cleared by the [place] FDA and has been authorized for detection and/or diagnosis of SARS-CoV-2 by FDA under an Emergency Use Authorization (EUA). This EUA will remain in effect (meaning this test can be used) for the duration of the COVID-19 declaration under Section 564(b)(1) of the Act, 21 U.S.C. section 360bbb-3(b)(1), unless the authorization is terminated or revoked.     Resp Syncytial Virus by PCR NEGATIVE NEGATIVE    Comment: (NOTE) Fact Sheet for Patients: BloggerCourse.com  Fact Sheet for Healthcare Providers: SeriousBroker.it  This test is not yet approved or cleared by the [place] FDA and has been authorized for detection and/or diagnosis of SARS-CoV-2 by FDA under an Emergency Use Authorization (EUA). This EUA will remain in effect (meaning this test can be used) for the duration of the COVID-19 declaration under Section 564(b)(1) of the Act, 21 U.S.C. section 360bbb-3(b)(1), unless the authorization is terminated or revoked.  Performed at [facility], [address], [place], [place] [address]   Basic metabolic panel     Status: Abnormal   Collection Time: 11/28/23  2:15 AM  Result Value Ref Range   Sodium 141 135 - 145 mmol/L   Potassium 3.7 3.5 - 5.1 mmol/L   Chloride 102 98 - 111 mmol/L   CO2 29 22 - 32 mmol/L   Glucose, Bld 141 (H) 70  - 99 mg/dL    Comment: Glucose reference range applies only to samples taken after fasting for at least 8 hours.   BUN 17 6 - 20 mg/dL   Creatinine, Ser 6.38 0.44 - 1.00 mg/dL   Calcium 8.5 (L) 8.9 - 10.3 mg/dL   GFR, Estimated >75 >64 mL/min    Comment: (NOTE) Calculated using the CKD-EPI Creatinine Equation (2021)    Anion gap 10 5 - 15    Comment: Performed at [facility], [address], [place], [place] [address]  CBC with Differential/Platelet     Status: Abnormal   Collection Time: 11/28/23  2:15 AM  Result Value Ref Range   WBC 6.9 4.0 - 10.5 K/uL   RBC 4.67 3.87 - 5.11 MIL/uL   Hemoglobin 14.8 12.0 - 15.0 g/dL   HCT 18.8 (H) 41.6 - 60.6 %   MCV 100.2 (H) 80.0 - 100.0 fL   MCH 31.7 26.0 - 34.0 pg   MCHC 31.6 30.0 - 36.0 g/dL   RDW 30.1 60.1 - 09.3 %   Platelets 219 150 - 400 K/uL   nRBC 0.0 0.0 - 0.2 %   Neutrophils Relative % 75 %   Neutro Abs 5.2 1.7 -  7.7 K/uL   Lymphocytes Relative 16 %   Lymphs Abs 1.1 0.7 - 4.0 K/uL   Monocytes Relative 5 %   Monocytes Absolute 0.3 0.1 - 1.0 K/uL   Eosinophils Relative 3 %   Eosinophils Absolute 0.2 0.0 - 0.5 K/uL   Basophils Relative 1 %   Basophils Absolute 0.1 0.0 - 0.1 K/uL   Immature Granulocytes 0 %   Abs Immature Granulocytes 0.02 0.00 - 0.07 K/uL    Comment: Performed at [facility], [address], [place], [place] [address]  Blood gas, venous (at [facility] and [facility])     Status: Abnormal   Collection Time: 11/28/23  3:15 AM  Result Value Ref Range   pH, Ven 7.28 7.25 - 7.43   pCO2, Ven 70 (H) 44 - 60 mmHg   pO2, Ven 47 (H) 32 - 45 mmHg   Bicarbonate 32.9 (H) 20.0 - 28.0 mmol/L   Acid-Base Excess 3.9 (H) 0.0 - 2.0 mmol/L   O2 Saturation 79.8 %   Patient temperature 37.0    Drawn by [id number]     Comment: Performed at [facility], [address] [place], [place] [address]   DG Chest Port 1 View  Result Date: 11/28/2023 CLINICAL DATA:  Shortness of breath. EXAM:  PORTABLE CHEST 1 VIEW COMPARISON:  November 07, 2023 FINDINGS: The heart size and mediastinal contours are within normal limits. The lungs are hyperinflated. Mild atelectatic changes are seen within the lateral aspect of the right lung base. No pleural effusion or pneumothorax is identified. A 9 mm benign-appearing sclerotic focus is seen overlying the shaft of the proximal right humerus. No acute osseous abnormalities are identified. IMPRESSION: Mild right basilar atelectasis. Electronically Signed   By: Aram Candela M.D.   On: 11/28/2023 02:12    Pending Labs Unresulted Labs (From admission, onward)     Start     Ordered   11/29/23 0500  Basic metabolic panel  Tomorrow morning,   R        11/28/23 0814   11/29/23 0500  CBC  Tomorrow morning,   R        11/28/23 0814            Vitals/Pain Today's Vitals   11/28/23 0200 11/28/23 0215 11/28/23 0700 11/28/23 0732  BP: (!) 118/98  113/65 94/65  Pulse: 97 91 82 79  Resp: 18 (!) 21 (!) 23 16  Temp:      SpO2: 97% 99% 98% 96%  Weight:      Height:      PainSc:        Isolation Precautions No active isolations  Medications Medications  albuterol (PROVENTIL) (2.5 MG/3ML) 0.083% nebulizer solution (0 mg/hr Nebulization Stopped 11/28/23 0519)  enoxaparin (LOVENOX) injection 30 mg (has no administration in time range)  acetaminophen (TYLENOL) tablet 650 mg (has no administration in time range)    Or  acetaminophen (TYLENOL) suppository 650 mg (has no administration in time range)  traZODone (DESYREL) tablet 25 mg (has no administration in time range)  ondansetron (ZOFRAN) tablet 4 mg (has no administration in time range)    Or  ondansetron (ZOFRAN) injection 4 mg (has no administration in time range)  albuterol (PROVENTIL) (2.5 MG/3ML) 0.083% nebulizer solution 2.5 mg (has no administration in time range)  methylPREDNISolone sodium succinate (SOLU-MEDROL) 40 mg/mL injection 40 mg (has no administration in time range)   ipratropium-albuterol (DUONEB) 0.5-2.5 (3) MG/3ML nebulizer solution 3 mL (3 mLs Nebulization Given  11/28/23 0835)  pantoprazole (PROTONIX) EC tablet 40 mg (has no administration in time range)  senna-docusate (Senokot-S) tablet 1 tablet (has no administration in time range)  polyethylene glycol (MIRALAX / GLYCOLAX) packet 17 g (has no administration in time range)  LORazepam (ATIVAN) injection 1 mg (1 mg Intravenous Given 11/28/23 0303)  ondansetron (ZOFRAN) injection 4 mg (4 mg Intravenous Given 11/28/23 0303)    Mobility walks with person assist

## 2023-11-28 NOTE — ED Triage Notes (Signed)
Pt arrives via [facility] from home in respiratory distress, with reports of shortness of breath x2 days with productive cough. Pt reports normally wearing 5-6L of oxygen all the time at home but ran out approx. 2 days ago as well as she reports she ran out of her inhalers 2 days ago also. Pt denies fever chills, reports nausea and headache at this time. Per EMS pt room air SPO2 80% on their arrival. Pt using accessory muscles to breathe and EMS reports pt is becoming more tired now than when they first arrived to her. Pt skin dusky and warm at this time. Pt reports minimal improvement in her breathing also.  EMS administered 10mg  Albuterol and 1mg  atrovent via duonebs, 125mg  solumedrol, and 2gm IV Magnesium prior to arrival. Pt SPO2 currently 99% with duoneb via simple mask in place with 8L/min of oxygen.   ED provider requested to room at this time for rapid evaluation of pt.

## 2023-11-28 NOTE — Progress Notes (Signed)
   11/28/23 0153  BiPAP/CPAP/SIPAP  $ Non-Invasive Ventilator  Non-Invasive Vent Initial  $ Face Mask Medium Yes  BiPAP/CPAP/SIPAP Pt Type Adult  BiPAP/CPAP/SIPAP V60  Mask Type Full face mask  Mask Size Medium  Set Rate 8 breaths/min  Respiratory Rate 22 breaths/min  IPAP 10 cmH20  EPAP 5 cmH2O  FiO2 (%) 35 %  Flow Rate 0 lpm  Minute Ventilation 12.5  Leak 0  Peak Inspiratory Pressure (PIP) 11  Tidal Volume (Vt) 603  Patient Home Equipment No  Auto Titrate No  Press High Alarm 30 cmH2O  Press Low Alarm 5 cmH2O

## 2023-11-28 NOTE — H&P (Signed)
History and Physical  Linda Foley MWN:[id number] DOB: 11/13/1966 DOA: 11/28/2023  PCP: Oneita Hurt, No   Chief Complaint: Shortness of breath  HPI: Linda Foley is a 57 y.o. female with medical history significant for substance abuse, anxiety, COPD with chronic hypoxia requiring 5 to 6 L nasal cannula oxygen admitted to the hospital with recurrent acute exacerbation of COPD.  Patient has had multiple hospital admissions for acute exacerbation of COPD, on 11/1 she left the hospital AMA after being admitted for the same.  Patient tells me that a couple of days ago, she ran out of oxygen as well as all of her medications.  She does not have a PCP, usually gets her medications from the hospital.  She did not take any of her medicines other than daily aspirin.  She denies any chest pain, had some subjective fever, has been coughing, it is nonproductive.  Got very short of breath last night and came to the hospital by EMS, she was given IV steroids, albuterol neb, magnesium by EMS.  She was found to be saturating 80% on room air by EMS upon their arrival, she was in quite a bit of respiratory distress, using accessory muscles.  She was rapidly evaluated in the ER, placed on BiPAP.  Currently, she is resting comfortably says that she feels much better still wearing BiPAP.  Review of Systems: Please see HPI for pertinent positives and negatives. A complete 10 system review of systems are otherwise negative.  Past Medical History:  Diagnosis Date   Anxiety    COPD (chronic obstructive pulmonary disease) (HCC)    Polysubstance abuse (HCC)    Past Surgical History:  Procedure Laterality Date   CESAREAN SECTION     CHOLECYSTECTOMY     TUBAL LIGATION      Social History:  reports that she has been smoking cigarettes. She has a 15 pack-year smoking history. She has never used smokeless tobacco. She reports current drug use. Drug: Marijuana. She reports that she does not drink alcohol.   Allergies   Allergen Reactions   Penicillins Nausea And Vomiting    Family History  Problem Relation Age of Onset   Diabetes Mother      Prior to Admission medications   Medication Sig Start Date End Date Taking? Authorizing Provider  albuterol (PROVENTIL) (2.5 MG/3ML) 0.083% nebulizer solution Take 3 mLs (2.5 mg total) by nebulization every 6 (six) hours as needed for wheezing or shortness of breath. 11/07/23   Durwin Glaze, MD  albuterol (VENTOLIN HFA) 108 (90 Base) MCG/ACT inhaler Inhale 1-2 puffs into the lungs every 6 (six) hours as needed for wheezing or shortness of breath. 11/07/23   Durwin Glaze, MD  azithromycin (ZITHROMAX) 250 MG tablet Take 1 tablet (250 mg total) by mouth daily. Take 1 tablet by mouth daily (first dose in ER) 11/07/23   Durwin Glaze, MD  predniSONE (DELTASONE) 50 MG tablet Take 1 tab by mouth daily 11/07/23   Durwin Glaze, MD    Physical Exam: BP 94/65   Pulse 79   Temp 98.1 F (36.7 C)   Resp 16   Ht 5\' 2"  (1.575 m)   Wt 47.6 kg   LMP 12/12/2011   SpO2 96%   BMI 19.20 kg/m   General:  Alert, oriented, calm, thin appearing female who looks older than her stated age, resting comfortably on BiPAP, pleasant and conversant.  No cough, looks quite comfortable. Cardiovascular: RRR, no murmurs or rubs, no peripheral  edema  Respiratory: Breath sounds are distant, but surprisingly clear to auscultation bilaterally, no wheezes, no crackles  Abdomen: soft, nontender, nondistended, normal bowel tones heard  Skin: dry, no rashes  Musculoskeletal: no joint effusions, normal range of motion  Psychiatric: appropriate affect, normal speech           Labs on Admission:  Basic Metabolic Panel: Recent Labs  Lab 11/28/23 0215  NA 141  K 3.7  CL 102  CO2 29  GLUCOSE 141*  BUN 17  CREATININE 0.59  CALCIUM 8.5*   Liver Function Tests: No results for input(s): "AST", "ALT", "ALKPHOS", "BILITOT", "PROT", "ALBUMIN" in the last 168 hours. No results for input(s):  "LIPASE", "AMYLASE" in the last 168 hours. No results for input(s): "AMMONIA" in the last 168 hours. CBC: Recent Labs  Lab 11/28/23 0215  WBC 6.9  NEUTROABS 5.2  HGB 14.8  HCT 46.8*  MCV 100.2*  PLT 219   Cardiac Enzymes: No results for input(s): "CKTOTAL", "CKMB", "CKMBINDEX", "TROPONINI" in the last 168 hours.  BNP (last 3 results) No results for input(s): "BNP" in the last 8760 hours.  ProBNP (last 3 results) No results for input(s): "PROBNP" in the last 8760 hours.  CBG: No results for input(s): "GLUCAP" in the last 168 hours.  Radiological Exams on Admission: DG Chest Port 1 View  Result Date: 11/28/2023 CLINICAL DATA:  Shortness of breath. EXAM: PORTABLE CHEST 1 VIEW COMPARISON:  November 07, 2023 FINDINGS: The heart size and mediastinal contours are within normal limits. The lungs are hyperinflated. Mild atelectatic changes are seen within the lateral aspect of the right lung base. No pleural effusion or pneumothorax is identified. A 9 mm benign-appearing sclerotic focus is seen overlying the shaft of the proximal right humerus. No acute osseous abnormalities are identified. IMPRESSION: Mild right basilar atelectasis. Electronically Signed   By: Aram Candela M.D.   On: 11/28/2023 02:12    Assessment/Plan Linda Foley is a 57 y.o. female with medical history significant for substance abuse, anxiety, COPD with chronic hypoxia requiring 5 to 6 L nasal cannula oxygen admitted to the hospital with recurrent acute exacerbation of COPD.   Acute exacerbation of COPD-with cough, significant hypoxia, no significant respiratory acidosis.  Due to running out of her home medications and oxygen a couple of days prior to admission.  Seems to be significantly improved now. -Observation admission to progressive -Wean off BiPAP now if tolerated, to titrate nasal cannula oxygen for O2 saturation greater than 90% -Received 125 mg IV Solu-Medrol with EMS, continue with 40 mg IV twice  daily -Scheduled DuoNebs, as needed albuterol -Incentive spirometer, and flutter valve -No clear indication for antibiotics at this time  GERD-oral Protonix  Constipation-scheduled Senokot, with as needed MiraLAX  History of polycythemia-due to COPD and ongoing tobacco abuse, monitor with labs  DVT prophylaxis: Lovenox     Code Status: Full Code  Consults called: None  Admission status: Observation  Time spent: 49 minutes  Drewey Begue Sharlette Dense MD [facility] Pager [phone]  If 7PM-7AM, please contact night-coverage [email or web address] Password [email or web address]  11/28/2023, 8:15 AM

## 2023-11-28 NOTE — ED Notes (Signed)
Nurse called to bedside and pt states that "my mouth is dry and I can't breathe, its freaking me out! Can I take the mask off?" Pt instructed that mask is necessary at this time. However, small sip of water provided to pt to wet her mouth, pt attempting to take several large drinks of water, and instructed by this nurse

## 2023-11-28 NOTE — ED Provider Notes (Signed)
[place] EMERGENCY DEPARTMENT AT [facility] Provider Note   CSN: [id number] Arrival date & time: 11/28/23  0126     History  Chief Complaint  Patient presents with   Respiratory Distress    Linda Foley is a 57 y.o. female presented to ED with shortness of breath.  Patient has a history of COPD, chronic hypoxic respiratory failure, on 4 L baseline cannula, smoking history, brought in by EMS for worsening shortness of breath.  EMS reports they gave her 2 rounds of DuoNebs, 125 mg IV Solu-Medrol, as well as 2 g of IV magnesium.  Patient is continuing to feel quite short of breath.  She was seen in the ED most recently 2 weeks ago on November 15 for COPD exacerbation.  Prior to that she had been hospitalized 1 month ago for COPD exacerbation.  HPI     Home Medications Prior to Admission medications   Medication Sig Start Date End Date Taking? Authorizing Provider  albuterol (PROVENTIL) (2.5 MG/3ML) 0.083% nebulizer solution Take 3 mLs (2.5 mg total) by nebulization every 6 (six) hours as needed for wheezing or shortness of breath. 11/07/23   Durwin Glaze, MD  albuterol (VENTOLIN HFA) 108 (90 Base) MCG/ACT inhaler Inhale 1-2 puffs into the lungs every 6 (six) hours as needed for wheezing or shortness of breath. 11/07/23   Durwin Glaze, MD  azithromycin (ZITHROMAX) 250 MG tablet Take 1 tablet (250 mg total) by mouth daily. Take 1 tablet by mouth daily (first dose in ER) 11/07/23   Durwin Glaze, MD  predniSONE (DELTASONE) 50 MG tablet Take 1 tab by mouth daily 11/07/23   Durwin Glaze, MD      Allergies    Penicillins    Review of Systems   Review of Systems  Physical Exam Updated Vital Signs BP (!) 118/98   Pulse 91   Temp 98.1 F (36.7 C)   Resp (!) 21   Ht 5\' 2"  (1.575 m)   Wt 47.6 kg   LMP 12/12/2011   SpO2 99%   BMI 19.20 kg/m  Physical Exam Constitutional:      General: She is not in acute distress.    Comments: Thin, frail, cachectic appearing   HENT:     Head: Normocephalic and atraumatic.  Eyes:     Conjunctiva/sclera: Conjunctivae normal.     Pupils: Pupils are equal, round, and reactive to light.  Cardiovascular:     Rate and Rhythm: Normal rate and regular rhythm.  Pulmonary:     Effort: Pulmonary effort is normal. No respiratory distress.     Comments: Expiratory wheezing bilaterally Abdominal:     General: There is no distension.     Tenderness: There is no abdominal tenderness.  Skin:    General: Skin is warm and dry.  Neurological:     General: No focal deficit present.     Mental Status: She is alert. Mental status is at baseline.  Psychiatric:        Mood and Affect: Mood normal.        Behavior: Behavior normal.     ED Results / Procedures / Treatments   Labs (all labs ordered are listed, but only abnormal results are displayed) Labs Reviewed  BASIC METABOLIC PANEL - Abnormal; Notable for the following components:      Result Value   Glucose, Bld 141 (*)    Calcium 8.5 (*)    All other components within normal limits  CBC  WITH DIFFERENTIAL/PLATELET - Abnormal; Notable for the following components:   HCT 46.8 (*)    MCV 100.2 (*)    All other components within normal limits  BLOOD GAS, VENOUS - Abnormal; Notable for the following components:   pCO2, Ven 70 (*)    pO2, Ven 47 (*)    Bicarbonate 32.9 (*)    Acid-Base Excess 3.9 (*)    All other components within normal limits  RESP PANEL BY RT-PCR (RSV, FLU A&B, COVID)  [id number]    EKG None  Radiology DG Chest Port 1 View  Result Date: 11/28/2023 CLINICAL DATA:  Shortness of breath. EXAM: PORTABLE CHEST 1 VIEW COMPARISON:  November 07, 2023 FINDINGS: The heart size and mediastinal contours are within normal limits. The lungs are hyperinflated. Mild atelectatic changes are seen within the lateral aspect of the right lung base. No pleural effusion or pneumothorax is identified. A 9 mm benign-appearing sclerotic focus is seen overlying the shaft of  the proximal right humerus. No acute osseous abnormalities are identified. IMPRESSION: Mild right basilar atelectasis. Electronically Signed   By: Aram Candela M.D.   On: 11/28/2023 02:12    Procedures .Critical Care  Performed by: Terald Sleeper, MD Authorized by: Terald Sleeper, MD   Critical care provider statement:    Critical care time (minutes):  40   Critical care time was exclusive of:  Separately billable procedures and treating other patients   Critical care was necessary to treat or prevent imminent or life-threatening deterioration of the following conditions:  Respiratory failure   Critical care was time spent personally by me on the following activities:  Ordering and performing treatments and interventions, ordering and review of laboratory studies, ordering and review of radiographic studies, pulse oximetry, review of old charts, examination of patient and evaluation of patient's response to treatment Comments:     COPD exacerbation on bipap     Medications Ordered in ED Medications  albuterol (PROVENTIL) (2.5 MG/3ML) 0.083% nebulizer solution (10 mg/hr Nebulization New Bag/Given 11/28/23 0152)  LORazepam (ATIVAN) injection 1 mg (1 mg Intravenous Given 11/28/23 0303)  ondansetron (ZOFRAN) injection 4 mg (4 mg Intravenous Given 11/28/23 0303)    ED Course/ Medical Decision Making/ A&P Clinical Course as of 11/28/23 0336  Fri Nov 28, 2023  0137 RN to call RT now to initiate bipap [MT]  0336 Reassessed and doing well on BiPAP, work of breathing is improved.  She was dealing with a lot of anxiety with the BiPAP and if given 1 mg of Ativan as well as some Zofran.   [MT]    Clinical Course User Index [MT] Angelos Wasco, Kermit Balo, MD                                 Medical Decision Making Amount and/or Complexity of Data Reviewed Labs: ordered. Radiology: ordered.  Risk Prescription drug management. Decision regarding hospitalization.   This patient presents to  the ED with concern for his breath, wheezing. This involves an extensive number of treatment options, and is a complaint that carries with it a high risk of complications and morbidity.  The differential diagnosis includes COPD exacerbation versus viral URI versus bacterial pneumonia versus other  Co-morbidities that complicate the patient evaluation: History of smoking, COPD, and high risk of exacerbation  Additional history obtained from EMS  External records from outside source obtained and reviewed including most recent hospital discharge summary  I ordered and personally interpreted labs.  The pertinent results include: COVID and flu are negative.  Venous gas with probable suspected slightly elevated chronic bicarbonate and CO2 consistent with chronic COPD.  No acidosis.  BMP and CBC unremarkable  I ordered imaging studies including x-ray of the chest I independently visualized and interpreted imaging which showed no emergent finding I agree with the radiologist interpretation  The patient was maintained on a cardiac monitor.  I personally viewed and interpreted the cardiac monitored which showed an underlying rhythm of: Sinus tachycardia  I ordered medication including continuous nebulizers, IV Ativan for anxiety, IV Zofran for nausea.  IV steroids and IV magnesium were given by EMS   I have reviewed the patients home medicines and have made adjustments as needed  Test Considered: Low suspicion for acute PE in this clinical setting  After the interventions noted above, I reevaluated the patient and found that they have: improved -patient much more comfortable on her BiPAP machine and on reassessment had some improvement of her wheezing.   Dispostion:  After consideration of the diagnostic results and the patients response to treatment, I feel that the patent would benefit from medical admission         Final Clinical Impression(s) / ED Diagnoses Final diagnoses:  COPD  exacerbation [facility])    Rx / DC Orders ED Discharge Orders     None         Kaidon Kinker, Kermit Balo, MD 11/28/23 [phone]

## 2023-11-28 NOTE — Progress Notes (Signed)
Pt taken off bipap per MD request.  Pt placed on 5L [place], per pt she wears O2 at home at 5L.  Sats 97%, RR 22, HR 90.  No distress noted.  Scheduled neb tx given

## 2023-11-29 ENCOUNTER — Encounter (HOSPITAL_COMMUNITY): Payer: Self-pay | Admitting: Internal Medicine

## 2023-11-29 ENCOUNTER — Observation Stay (HOSPITAL_COMMUNITY)
Admission: EM | Admit: 2023-11-29 | Discharge: 2023-11-30 | Disposition: A | Discharge status: Home / Self Care | Payer: 59 | Attending: Internal Medicine | Admitting: Internal Medicine

## 2023-11-29 ENCOUNTER — Other Ambulatory Visit: Payer: Self-pay

## 2023-11-29 DIAGNOSIS — F17200 Nicotine dependence, unspecified, uncomplicated: Secondary | ICD-10-CM | POA: Diagnosis not present

## 2023-11-29 DIAGNOSIS — Z720 Tobacco use: Secondary | ICD-10-CM | POA: Diagnosis not present

## 2023-11-29 DIAGNOSIS — J441 Chronic obstructive pulmonary disease with (acute) exacerbation: Principal | ICD-10-CM | POA: Diagnosis present

## 2023-11-29 DIAGNOSIS — R0602 Shortness of breath: Secondary | ICD-10-CM | POA: Diagnosis present

## 2023-11-29 MED ORDER — NICOTINE 7 MG/24HR TD PT24
7.0000 mg | MEDICATED_PATCH | Freq: Every day | TRANSDERMAL | Status: DC
  Administered 2023-11-29 – 2023-11-30 (×2): 7 mg via TRANSDERMAL
  Filled 2023-11-29 (×2): qty 1

## 2023-11-29 MED ORDER — ENOXAPARIN SODIUM 40 MG/0.4ML IJ SOSY
40.0000 mg | PREFILLED_SYRINGE | INTRAMUSCULAR | Status: DC
  Administered 2023-11-29: 40 mg via SUBCUTANEOUS
  Filled 2023-11-29: qty 0.4

## 2023-11-29 MED ORDER — MELATONIN 5 MG PO TABS
5.0000 mg | ORAL_TABLET | Freq: Every evening | ORAL | Status: DC | PRN
  Administered 2023-11-29: 5 mg via ORAL
  Filled 2023-11-29: qty 1

## 2023-11-29 MED ORDER — ALBUTEROL SULFATE (2.5 MG/3ML) 0.083% IN NEBU
10.0000 mg/h | INHALATION_SOLUTION | Freq: Once | RESPIRATORY_TRACT | Status: AC
  Administered 2023-11-29: 10 mg/h via RESPIRATORY_TRACT
  Filled 2023-11-29: qty 3

## 2023-11-29 MED ORDER — LEVOFLOXACIN 500 MG PO TABS
500.0000 mg | ORAL_TABLET | Freq: Every day | ORAL | Status: DC
  Administered 2023-11-29 – 2023-11-30 (×2): 500 mg via ORAL
  Filled 2023-11-29 (×2): qty 1

## 2023-11-29 MED ORDER — IPRATROPIUM-ALBUTEROL 0.5-2.5 (3) MG/3ML IN SOLN
RESPIRATORY_TRACT | Status: AC
  Filled 2023-11-29: qty 3

## 2023-11-29 MED ORDER — ONDANSETRON HCL 4 MG/2ML IJ SOLN: 4.0000 mg | Freq: Four times a day (QID) | INTRAMUSCULAR | Status: DC | PRN

## 2023-11-29 MED ORDER — PREDNISONE 20 MG PO TABS
60.0000 mg | ORAL_TABLET | Freq: Once | ORAL | Status: AC
  Administered 2023-11-29: 60 mg via ORAL
  Filled 2023-11-29: qty 3

## 2023-11-29 MED ORDER — BUDESONIDE 0.25 MG/2ML IN SUSP
0.2500 mg | Freq: Two times a day (BID) | RESPIRATORY_TRACT | Status: DC
  Administered 2023-11-29 – 2023-11-30 (×3): 0.25 mg via RESPIRATORY_TRACT
  Filled 2023-11-29 (×2): qty 2

## 2023-11-29 MED ORDER — ACETAMINOPHEN 325 MG PO TABS
650.0000 mg | ORAL_TABLET | Freq: Four times a day (QID) | ORAL | Status: DC | PRN
  Administered 2023-11-29: 650 mg via ORAL
  Filled 2023-11-29: qty 2

## 2023-11-29 MED ORDER — ALBUTEROL SULFATE (2.5 MG/3ML) 0.083% IN NEBU: 2.5000 mg | INHALATION_SOLUTION | RESPIRATORY_TRACT | Status: DC | PRN

## 2023-11-29 MED ORDER — IPRATROPIUM-ALBUTEROL 0.5-2.5 (3) MG/3ML IN SOLN
3.0000 mL | Freq: Four times a day (QID) | RESPIRATORY_TRACT | Status: DC
  Administered 2023-11-29 – 2023-11-30 (×5): 3 mL via RESPIRATORY_TRACT
  Filled 2023-11-29 (×4): qty 3

## 2023-11-29 MED ORDER — METHYLPREDNISOLONE SODIUM SUCC 40 MG IJ SOLR
40.0000 mg | Freq: Two times a day (BID) | INTRAMUSCULAR | Status: DC
  Administered 2023-11-29 – 2023-11-30 (×3): 40 mg via INTRAVENOUS
  Filled 2023-11-29 (×3): qty 1

## 2023-11-29 MED ORDER — OXYCODONE HCL 5 MG PO TABS
5.0000 mg | ORAL_TABLET | Freq: Four times a day (QID) | ORAL | Status: DC | PRN
  Administered 2023-11-29 – 2023-11-30 (×2): 5 mg via ORAL
  Filled 2023-11-29 (×2): qty 1

## 2023-11-29 MED ORDER — ALBUTEROL SULFATE (2.5 MG/3ML) 0.083% IN NEBU
INHALATION_SOLUTION | RESPIRATORY_TRACT | Status: AC
  Filled 2023-11-29: qty 12

## 2023-11-29 MED ORDER — SACCHAROMYCES BOULARDII 250 MG PO CAPS
250.0000 mg | ORAL_CAPSULE | Freq: Two times a day (BID) | ORAL | Status: DC
  Administered 2023-11-29 – 2023-11-30 (×3): 250 mg via ORAL
  Filled 2023-11-29 (×3): qty 1

## 2023-11-29 NOTE — Progress Notes (Signed)
The patient smokes 1 pack of cigarettes a day and is requesting a nicotine patch. Messaged Chinita Greenland.

## 2023-11-29 NOTE — ED Triage Notes (Signed)
Pt BIB [facility] from home. Pt c/o difficulty breathing.upon ems arrival pt had upper respiratory wheezing. Ems administered  albuterol neb treatment. Post neb pt O2 82% on room air. Pt 96% on 15L non-rebreather. Pt Hx COPD.   148/78 108HR 96% 15L NRB 20RR

## 2023-11-29 NOTE — Progress Notes (Signed)
The  patient is now requesting sleep medication. Messaged Chinita Greenland.

## 2023-11-29 NOTE — ED Provider Notes (Signed)
[place] EMERGENCY DEPARTMENT AT [facility] Provider Note   CSN: [id number] Arrival date & time: 11/29/23  0419     History  Chief Complaint  Patient presents with   Shortness of Breath    Linda Foley is a 57 y.o. female.  The history is provided by the patient.  Patient with history of anxiety and COPD presents with shortness of breath.  Patient was just in the hospital & left AGAINST MEDICAL ADVICE last night.  After going home she reports she started having increasing cough, wheezing and shortness of breath.  Denies hemoptysis. She continues to smoke.  She is supposed to be on home oxygen but ran out.  She reports she left AMA due to an emergency.  Since she started feeling worse she called ambulance   Last dose of steroids was last night Past Medical History:  Diagnosis Date   Anxiety    COPD (chronic obstructive pulmonary disease) (HCC)    Polysubstance abuse (HCC)     Home Medications Prior to Admission medications   Medication Sig Start Date End Date Taking? Authorizing Provider  albuterol (PROVENTIL) (2.5 MG/3ML) 0.083% nebulizer solution Take 3 mLs (2.5 mg total) by nebulization every 6 (six) hours as needed for wheezing or shortness of breath. 11/07/23  Yes Durwin Glaze, MD  albuterol (VENTOLIN HFA) 108 (90 Base) MCG/ACT inhaler Inhale 1-2 puffs into the lungs every 6 (six) hours as needed for wheezing or shortness of breath. Patient taking differently: Inhale 2 puffs into the lungs every 6 (six) hours as needed for wheezing or shortness of breath. 11/07/23  Yes Durwin Glaze, MD  azithromycin (ZITHROMAX) 250 MG tablet Take 1 tablet (250 mg total) by mouth daily. Take 1 tablet by mouth daily (first dose in ER) Patient not taking: Reported on 11/28/2023 11/07/23   Durwin Glaze, MD  predniSONE (DELTASONE) 50 MG tablet Take 1 tab by mouth daily Patient not taking: Reported on 11/28/2023 11/07/23   Durwin Glaze, MD      Allergies    Penicillins     Review of Systems   Review of Systems  Constitutional:  Negative for fever.  Respiratory:  Positive for cough and shortness of breath.     Physical Exam Updated Vital Signs BP 125/82   Pulse (!) 109   Temp 97.7 F (36.5 C) (Oral)   Resp 16   LMP 12/12/2011   SpO2 100%  Physical Exam CONSTITUTIONAL: Chronically ill-appearing HEAD: Normocephalic/atraumatic EYES: EOMI ENMT: Mucous membranes moist, poor dentition NECK: supple no meningeal signs CV: S1/S2 noted, no murmurs/rubs/gallops noted LUNGS: Wheezing bilaterally, on nasal cannula ABDOMEN: soft NEURO: Pt is awake/alert/appropriate, moves all extremitiesx4.  No facial droop.   EXTREMITIES: pulses normal/equal, full ROM, no lower extremity edema SKIN: warm, color normal  ED Results / Procedures / Treatments   Labs (all labs ordered are listed, but only abnormal results are displayed) Labs Reviewed - No data to display  EKG EKG Interpretation Date/Time:  Saturday November 29 2023 04:34:08 EST Ventricular Rate:  103 PR Interval:  193 QRS Duration:  88 QT Interval:  328 QTC Calculation: 430 R Axis:   82  Text Interpretation: Sinus tachycardia Consider right atrial enlargement No significant change since last tracing Confirmed by Zadie Rhine ([id number]) on 11/29/2023 4:41:08 AM  Radiology DG Chest Port 1 View  Result Date: 11/28/2023 CLINICAL DATA:  Shortness of breath. EXAM: PORTABLE CHEST 1 VIEW COMPARISON:  November 07, 2023 FINDINGS: The heart size and  mediastinal contours are within normal limits. The lungs are hyperinflated. Mild atelectatic changes are seen within the lateral aspect of the right lung base. No pleural effusion or pneumothorax is identified. A 9 mm benign-appearing sclerotic focus is seen overlying the shaft of the proximal right humerus. No acute osseous abnormalities are identified. IMPRESSION: Mild right basilar atelectasis. Electronically Signed   By: Aram Candela M.D.   On: 11/28/2023  02:12    Procedures Procedures    Medications Ordered in ED Medications  albuterol (PROVENTIL) (2.5 MG/3ML) 0.083% nebulizer solution (  Not Given 11/29/23 0525)  predniSONE (DELTASONE) tablet 60 mg (60 mg Oral Given 11/29/23 0526)  albuterol (PROVENTIL) (2.5 MG/3ML) 0.083% nebulizer solution (10 mg/hr Nebulization Given 11/29/23 0518)    ED Course/ Medical Decision Making/ A&P                                 Medical Decision Making Risk Prescription drug management. Decision regarding hospitalization.   This patient presents to the ED for concern of shortness of breath, this involves an extensive number of treatment options, and is a complaint that carries with it a high risk of complications and morbidity.  The differential diagnosis includes but is not limited to Acute coronary syndrome, pneumonia, acute pulmonary edema, pneumothorax, acute anemia, pulmonary embolism COPD exacerbation  Comorbidities that complicate the patient evaluation: Patient's presentation is complicated by their history of COPD  Social Determinants of Health: Patient's  multiple ER visits and smoking history   increases the complexity of managing their presentation  Additional history obtained: Records reviewed previous admission documents   Cardiac Monitoring: The patient was maintained on a cardiac monitor.  I personally viewed and interpreted the cardiac monitor which showed an underlying rhythm of:  sinus rhythm  Medicines ordered and prescription drug management: I ordered medication including albuterol nebulizer and steroids for wheezing  Critical Interventions:   admission for respiratory failure and COPD exacerbation  Consultations Obtained: I requested consultation with the admitting physician [facility]. Julian Reil , and discussed  findings as well as pertinent plan - they recommend: Admit  Reevaluation: After the interventions noted above, I reevaluated the patient and found that they  have :stayed the same  Complexity of problems addressed: Patient's presentation is most consistent with  acute presentation with potential threat to life or bodily function  Disposition: After consideration of the diagnostic results and the patient's response to treatment,  I feel that the patent would benefit from admission   .    Patient was just in the hospital & left against medical advice approximately 8 PM last night.  After going home she became more short of breath.  Will defer any labs or imaging.  Patient started on steroids and nebs and called report to hospitalist       Final Clinical Impression(s) / ED Diagnoses Final diagnoses:  COPD exacerbation [facility])    Rx / DC Orders ED Discharge Orders     None         Zadie Rhine, MD 11/29/23 [phone]

## 2023-11-29 NOTE — ED Notes (Signed)
Assumed care of patient. Patient resting comfortably in bed with no signs of acute distress noted. Waiting on hospital admission orders, pt asking for something to drink.

## 2023-11-29 NOTE — ED Notes (Signed)
ED TO INPATIENT HANDOFF REPORT  Name/Age/Gender Linda Foley 57 y.o. female  Code Status Code Status History     Date Active Date Inactive Code Status Order ID Comments User Context   11/28/2023 0814 11/29/2023 0221 Full Code [id number]  Maryln Gottron, MD ED   10/24/2023 0757 10/26/2023 2333 Full Code [id number]  Bobette Mo, MD Inpatient   02/21/2022 1046 02/23/2022 2008 Full Code [id number]  Teddy Spike, DO Inpatient   06/20/2018 0403 06/21/2018 1521 Full Code [id number]  Hillary Bow, DO Inpatient    Questions for Most Recent Historical Code Status (Order [id number])     Question Answer   By: Consent: discussion documented in EHR            Home/SNF/Other Home  Chief Complaint COPD with acute exacerbation (HCC) [J44.1]  Level of Care/Admitting Diagnosis ED Disposition     ED Disposition  Admit   Condition  --   Comment  Hospital Area: [facility] [[id number]]  Level of Care: Med-Surg [16]  May place patient in observation at [facility] or [facility] if equivalent level of care is available:: No  Covid Evaluation: Asymptomatic - no recent exposure (last 10 days) testing not required  Diagnosis: COPD with acute exacerbation [facility]) [[id number]]  Admitting Physician: Osvaldo Shipper [[id number]]  Attending Physician: Osvaldo Shipper [[id number]]          Medical History Past Medical History:  Diagnosis Date   Anxiety    COPD (chronic obstructive pulmonary disease) (HCC)    Polysubstance abuse (HCC)     Allergies Allergies  Allergen Reactions   Penicillins Nausea And Vomiting    Did it involve swelling of the face/tongue/throat, SOB, or low BP? No Did it involve sudden or severe rash/hives, skin peeling, or any reaction on the inside of your mouth or nose? No Did you need to seek medical attention at a hospital or doctor's office? No When did it last happen?      Several Years Ago If all above answers are "NO", may proceed with cephalosporin  use.      IV Location/Drains/Wounds Patient Lines/Drains/Airways Status     Active Line/Drains/Airways     Name Placement date Placement time Site Days   Peripheral IV 11/29/23 20 G Left;Posterior Hand 11/29/23  --  Hand  less than 1            Labs/Imaging Results for orders placed or performed during the hospital encounter of 11/28/23 (from the past 48 hour(s))  Resp panel by RT-PCR (RSV, Flu A&B, Covid) Anterior Nasal Swab     Status: None   Collection Time: 11/28/23  1:43 AM   Specimen: Anterior Nasal Swab  Result Value Ref Range   SARS Coronavirus 2 by RT PCR NEGATIVE NEGATIVE    Comment: (NOTE) SARS-CoV-2 target nucleic acids are NOT DETECTED.  The SARS-CoV-2 RNA is generally detectable in upper respiratory specimens during the acute phase of infection. The lowest concentration of SARS-CoV-2 viral copies this assay can detect is 138 copies/mL. A negative result does not preclude SARS-Cov-2 infection and should not be used as the sole basis for treatment or other patient management decisions. A negative result may occur with  improper specimen collection/handling, submission of specimen other than nasopharyngeal swab, presence of viral mutation(s) within the areas targeted by this assay, and inadequate number of viral copies(<138 copies/mL). A negative result must be combined with clinical observations, patient history, and epidemiological information. The expected result is Negative.  Fact Sheet for Patients:  BloggerCourse.com  Fact Sheet for Healthcare Providers:  SeriousBroker.it  This test is no t yet approved or cleared by the [place] FDA and  has been authorized for detection and/or diagnosis of SARS-CoV-2 by FDA under an Emergency Use Authorization (EUA). This EUA will remain  in effect (meaning this test can be used) for the duration of the COVID-19 declaration under Section 564(b)(1) of the Act,  21 U.S.C.section 360bbb-3(b)(1), unless the authorization is terminated  or revoked sooner.       Influenza A by PCR NEGATIVE NEGATIVE   Influenza B by PCR NEGATIVE NEGATIVE    Comment: (NOTE) The Xpert Xpress SARS-CoV-2/FLU/RSV plus assay is intended as an aid in the diagnosis of influenza from Nasopharyngeal swab specimens and should not be used as a sole basis for treatment. Nasal washings and aspirates are unacceptable for Xpert Xpress SARS-CoV-2/FLU/RSV testing.  Fact Sheet for Patients: BloggerCourse.com  Fact Sheet for Healthcare Providers: SeriousBroker.it  This test is not yet approved or cleared by the [place] FDA and has been authorized for detection and/or diagnosis of SARS-CoV-2 by FDA under an Emergency Use Authorization (EUA). This EUA will remain in effect (meaning this test can be used) for the duration of the COVID-19 declaration under Section 564(b)(1) of the Act, 21 U.S.C. section 360bbb-3(b)(1), unless the authorization is terminated or revoked.     Resp Syncytial Virus by PCR NEGATIVE NEGATIVE    Comment: (NOTE) Fact Sheet for Patients: BloggerCourse.com  Fact Sheet for Healthcare Providers: SeriousBroker.it  This test is not yet approved or cleared by the [place] FDA and has been authorized for detection and/or diagnosis of SARS-CoV-2 by FDA under an Emergency Use Authorization (EUA). This EUA will remain in effect (meaning this test can be used) for the duration of the COVID-19 declaration under Section 564(b)(1) of the Act, 21 U.S.C. section 360bbb-3(b)(1), unless the authorization is terminated or revoked.  Performed at [facility], [address], [place], [place] [address]   Basic metabolic panel     Status: Abnormal   Collection Time: 11/28/23  2:15 AM  Result Value Ref Range   Sodium 141 135 - 145 mmol/L    Potassium 3.7 3.5 - 5.1 mmol/L   Chloride 102 98 - 111 mmol/L   CO2 29 22 - 32 mmol/L   Glucose, Bld 141 (H) 70 - 99 mg/dL    Comment: Glucose reference range applies only to samples taken after fasting for at least 8 hours.   BUN 17 6 - 20 mg/dL   Creatinine, Ser 6.14 0.44 - 1.00 mg/dL   Calcium 8.5 (L) 8.9 - 10.3 mg/dL   GFR, Estimated >43 >15 mL/min    Comment: (NOTE) Calculated using the CKD-EPI Creatinine Equation (2021)    Anion gap 10 5 - 15    Comment: Performed at [facility], [address], [place], [place] [address]  CBC with Differential/Platelet     Status: Abnormal   Collection Time: 11/28/23  2:15 AM  Result Value Ref Range   WBC 6.9 4.0 - 10.5 K/uL   RBC 4.67 3.87 - 5.11 MIL/uL   Hemoglobin 14.8 12.0 - 15.0 g/dL   HCT 76.1 (H) 95.0 - 93.2 %   MCV 100.2 (H) 80.0 - 100.0 fL   MCH 31.7 26.0 - 34.0 pg   MCHC 31.6 30.0 - 36.0 g/dL   RDW 67.1 24.5 - 80.9 %   Platelets 219 150 - 400 K/uL  nRBC 0.0 0.0 - 0.2 %   Neutrophils Relative % 75 %   Neutro Abs 5.2 1.7 - 7.7 K/uL   Lymphocytes Relative 16 %   Lymphs Abs 1.1 0.7 - 4.0 K/uL   Monocytes Relative 5 %   Monocytes Absolute 0.3 0.1 - 1.0 K/uL   Eosinophils Relative 3 %   Eosinophils Absolute 0.2 0.0 - 0.5 K/uL   Basophils Relative 1 %   Basophils Absolute 0.1 0.0 - 0.1 K/uL   Immature Granulocytes 0 %   Abs Immature Granulocytes 0.02 0.00 - 0.07 K/uL    Comment: Performed at [facility], [address] [place], [place] [address]  Blood gas, venous (at [facility] and [facility])     Status: Abnormal   Collection Time: 11/28/23  3:15 AM  Result Value Ref Range   pH, Ven 7.28 7.25 - 7.43   pCO2, Ven 70 (H) 44 - 60 mmHg   pO2, Ven 47 (H) 32 - 45 mmHg   Bicarbonate 32.9 (H) 20.0 - 28.0 mmol/L   Acid-Base Excess 3.9 (H) 0.0 - 2.0 mmol/L   O2 Saturation 79.8 %   Patient temperature 37.0    Drawn by [id number]     Comment: Performed at [facility] [address],  [place], [place] [address]   DG Chest Port 1 View  Result Date: 11/28/2023 CLINICAL DATA:  Shortness of breath. EXAM: PORTABLE CHEST 1 VIEW COMPARISON:  November 07, 2023 FINDINGS: The heart size and mediastinal contours are within normal limits. The lungs are hyperinflated. Mild atelectatic changes are seen within the lateral aspect of the right lung base. No pleural effusion or pneumothorax is identified. A 9 mm benign-appearing sclerotic focus is seen overlying the shaft of the proximal right humerus. No acute osseous abnormalities are identified. IMPRESSION: Mild right basilar atelectasis. Electronically Signed   By: Aram Candela M.D.   On: 11/28/2023 02:12    Pending Labs Unresulted Labs (From admission, onward)    None       Vitals/Pain Today's Vitals   11/29/23 0700 11/29/23 0715 11/29/23 0730 11/29/23 0745  BP: 113/62 112/60 104/73 109/64  Pulse: 100 (!) 102 (!) 107 98  Resp: 15 12 (!) 27 13  Temp:      TempSrc:      SpO2: 94% 92% 90% 93%  PainSc:        Isolation Precautions No active isolations  Medications Medications  albuterol (PROVENTIL) (2.5 MG/3ML) 0.083% nebulizer solution (  Not Given 11/29/23 0525)  oxyCODONE (Oxy IR/ROXICODONE) immediate release tablet 5 mg (has no administration in time range)  acetaminophen (TYLENOL) tablet 650 mg (has no administration in time range)  ondansetron (ZOFRAN) injection 4 mg (has no administration in time range)  ipratropium-albuterol (DUONEB) 0.5-2.5 (3) MG/3ML nebulizer solution 3 mL (has no administration in time range)  budesonide (PULMICORT) nebulizer solution 0.25 mg (has no administration in time range)  methylPREDNISolone sodium succinate (SOLU-MEDROL) 40 mg/mL injection 40 mg (has no administration in time range)  levofloxacin (LEVAQUIN) tablet 500 mg (has no administration in time range)  saccharomyces boulardii (FLORASTOR) capsule 250 mg (has no administration in time range)  predniSONE (DELTASONE) tablet 60 mg (60  mg Oral Given 11/29/23 0526)  albuterol (PROVENTIL) (2.5 MG/3ML) 0.083% nebulizer solution (10 mg/hr Nebulization Given 11/29/23 0518)    Mobility walks

## 2023-11-29 NOTE — H&P (Signed)
[facility] History and Physical  Linda Foley:[id number] DOB: 1966-09-19 DOA: 11/29/2023   PCP: Pcp, No  Specialists: None  Chief Complaint: Shortness of breath  HPI: Linda Foley is a 57 y.o. female with a past medical history of substance abuse, anxiety disorder, COPD, chronic hypoxic respiratory failure on home oxygen at 5 to 6 L/min was recently seen in the emergency department on 12/6 with COPD exacerbation.  She was admitted but then decided to leave AMA.  She came back to the hospital earlier this morning with persistent shortness of breath.  She was noted to be saturating 82% on room air.  Initially required nonrebreather.  She was given nebulizer treatments.  Symptoms persisted so she would require hospitalization for further management.  She denies any chest pain.  Complains of tooth pain in her right tooth.  No nausea or vomiting.  She was told that she would benefit from staying in the hospital and not leaving before she is better.  She understands.  In the emergency department COVID-19, influenza and RSV PCR's were negative.  WBC was noted to be normal.  Venous blood gas showed a pH of 7.28 with pCO2 of 70.  Chest x-ray showed atelectasis.  Home Medications: Prior to Admission medications   Medication Sig Start Date End Date Taking? Authorizing Provider  albuterol (PROVENTIL) (2.5 MG/3ML) 0.083% nebulizer solution Take 3 mLs (2.5 mg total) by nebulization every 6 (six) hours as needed for wheezing or shortness of breath. 11/07/23  Yes Durwin Glaze, MD  albuterol (VENTOLIN HFA) 108 (90 Base) MCG/ACT inhaler Inhale 1-2 puffs into the lungs every 6 (six) hours as needed for wheezing or shortness of breath. Patient taking differently: Inhale 2 puffs into the lungs every 6 (six) hours as needed for wheezing or shortness of breath. 11/07/23  Yes Durwin Glaze, MD  azithromycin (ZITHROMAX) 250 MG tablet Take 1 tablet (250 mg total) by mouth daily. Take 1 tablet by mouth  daily (first dose in ER) Patient not taking: Reported on 11/28/2023 11/07/23   Durwin Glaze, MD  predniSONE (DELTASONE) 50 MG tablet Take 1 tab by mouth daily Patient not taking: Reported on 11/28/2023 11/07/23   Durwin Glaze, MD    Allergies:  Allergies  Allergen Reactions   Penicillins Nausea And Vomiting    Did it involve swelling of the face/tongue/throat, SOB, or low BP? No Did it involve sudden or severe rash/hives, skin peeling, or any reaction on the inside of your mouth or nose? No Did you need to seek medical attention at a hospital or doctor's office? No When did it last happen?      Several Years Ago If all above answers are "NO", may proceed with cephalosporin use.      Past Medical History: Past Medical History:  Diagnosis Date   Anxiety    COPD (chronic obstructive pulmonary disease) (HCC)    Polysubstance abuse (HCC)     Past Surgical History:  Procedure Laterality Date   CESAREAN SECTION     CHOLECYSTECTOMY     TUBAL LIGATION      Social History: Lives in [place].  Continues to smoke half pack of cigarettes on a daily basis.  Occasional marijuana use.  Family History:  Family History  Problem Relation Age of Onset   Diabetes Mother      Review of Systems - History obtained from the patient General ROS: positive for  - fatigue Psychological ROS: positive for - anxiety Ophthalmic ROS:  negative ENT ROS: negative Allergy and Immunology ROS: negative Hematological and Lymphatic ROS: negative Endocrine ROS: negative Respiratory ROS: As in HPI Cardiovascular ROS: no chest pain or dyspnea on exertion Gastrointestinal ROS: no abdominal pain, change in bowel habits, or black or bloody stools Genito-Urinary ROS: no dysuria, trouble voiding, or hematuria Musculoskeletal ROS: negative Neurological ROS: no TIA or stroke symptoms Dermatological ROS: negative  Physical Examination  Vitals:   11/29/23 0745 11/29/23 0828 11/29/23 0838 11/29/23 0905  BP:  109/64 129/83    Pulse: 98 (!) 103    Resp: 13 14    Temp:  98 F (36.7 C)    TempSrc:  Oral    SpO2: 93% 97%  95%  Weight:   50.3 kg   Height:   5\' 2"  (1.575 m)     BP 129/83 (BP Location: Left Arm)   Pulse (!) 103   Temp 98 F (36.7 C) (Oral)   Resp 14   Ht 5\' 2"  (1.575 m)   Wt 50.3 kg   LMP 12/12/2011   SpO2 95%   BMI 20.28 kg/m   General appearance: alert, cooperative, appears stated age, and no distress Head: Normocephalic, without obvious abnormality, atraumatic Eyes: conjunctivae/corneas clear. PERRL, EOM's intact. Throat: Poor dentition noted Neck: no adenopathy, no carotid bruit, no JVD, supple, symmetrical, trachea midline, and thyroid not enlarged, symmetric, no tenderness/mass/nodules Back: symmetric, no curvature. ROM normal. No CVA tenderness. Resp: Tachypneic at rest.  No use of accessory muscles.  Diffuse wheezing bilaterally.  Few crackles at the bases. Cardio: regular rate and rhythm, S1, S2 normal, no murmur, click, rub or gallop GI: soft, non-tender; bowel sounds normal; no masses,  no organomegaly Extremities: extremities normal, atraumatic, no cyanosis or edema Pulses: 2+ and symmetric Skin: Skin color, texture, turgor normal. No rashes or lesions Lymph nodes: Cervical, supraclavicular, and axillary nodes normal. Neurologic: Awake alert.  Oriented x 3.  Cranial nerves II to XII intact.  Motor strength equal bilateral upper and lower extremities.   Labs on Admission: I have personally reviewed following labs and imaging studies  CBC: Recent Labs  Lab 11/28/23 0215  WBC 6.9  NEUTROABS 5.2  HGB 14.8  HCT 46.8*  MCV 100.2*  PLT 219   Basic Metabolic Panel: Recent Labs  Lab 11/28/23 0215  NA 141  K 3.7  CL 102  CO2 29  GLUCOSE 141*  BUN 17  CREATININE 0.59  CALCIUM 8.5*   GFR: Estimated Creatinine Clearance: 62.1 mL/min (by C-G formula based on SCr of 0.59 mg/dL).   Radiological Exams on Admission: DG Chest Port 1 View  Result  Date: 11/28/2023 CLINICAL DATA:  Shortness of breath. EXAM: PORTABLE CHEST 1 VIEW COMPARISON:  November 07, 2023 FINDINGS: The heart size and mediastinal contours are within normal limits. The lungs are hyperinflated. Mild atelectatic changes are seen within the lateral aspect of the right lung base. No pleural effusion or pneumothorax is identified. A 9 mm benign-appearing sclerotic focus is seen overlying the shaft of the proximal right humerus. No acute osseous abnormalities are identified. IMPRESSION: Mild right basilar atelectasis. Electronically Signed   By: Aram Candela M.D.   On: 11/28/2023 02:12    My interpretation of Electrocardiogram: Sinus tachycardia at 103 bpm.  Normal axis.  Intervals are normal.  No concerning ST or T wave changes noted.   Problem List  Principal Problem:   COPD with acute exacerbation (HCC) Active Problems:   Tobacco abuse   Assessment: This is a 57 year old Caucasian  female with past medical history of COPD on home oxygen presented with worsening shortness of breath.  She left AMA last night and came back early this morning.  She will be hospitalized for further management of acute COPD exacerbation.  Plan: Acute COPD exacerbation/acute on chronic respiratory failure with hypoxia: Will initiate intravenous steroids, antibiotics, nebulizer treatments, she will be given nebulized budesonide.  She is not on maintenance inhaler treatment prior to admission.  This will need to be addressed prior to discharge.  Saturations are in the early 90s on 4 to 5 L of oxygen.  Tobacco abuse/marijuana abuse: She was counseled.   DVT Prophylaxis: Lovenox Code Status: Full code Family Communication: Discussed with patient Disposition: Hopefully return home when improved Consults called: None Admission Status: Status is: Observation The patient remains OBS appropriate and will d/c before 2 midnights.    Severity of Illness: The appropriate patient status for this  patient is OBSERVATION. Observation status is judged to be reasonable and necessary in order to provide the required intensity of service to ensure the patient's safety. The patient's presenting symptoms, physical exam findings, and initial radiographic and laboratory data in the context of their medical condition is felt to place them at decreased risk for further clinical deterioration. Furthermore, it is anticipated that the patient will be medically stable for discharge from the hospital within 2 midnights of admission.    Further management decisions will depend on results of further testing and patient's response to treatment.   Matika Bartell [employer]  [facility] [employer] on [email or web address]  11/29/2023, 11:02 AM

## 2023-11-30 DIAGNOSIS — Z720 Tobacco use: Secondary | ICD-10-CM | POA: Diagnosis not present

## 2023-11-30 DIAGNOSIS — J441 Chronic obstructive pulmonary disease with (acute) exacerbation: Secondary | ICD-10-CM | POA: Diagnosis not present

## 2023-11-30 LAB — BASIC METABOLIC PANEL
Anion gap: 5 (ref 5–15)
BUN: 26 mg/dL — ABNORMAL HIGH (ref 6–20)
CO2: 30 mmol/L (ref 22–32)
Calcium: 8.7 mg/dL — ABNORMAL LOW (ref 8.9–10.3)
Chloride: 102 mmol/L (ref 98–111)
Creatinine, Ser: 0.7 mg/dL (ref 0.44–1.00)
GFR, Estimated: >60 mL/min (ref >60)
Glucose, Bld: 143 mg/dL — ABNORMAL HIGH (ref 70–99)
Potassium: 4.8 mmol/L (ref 3.5–5.1)
Sodium: 137 mmol/L (ref 135–145)

## 2023-11-30 LAB — CBC
HCT: 42.8 % (ref 36.0–46.0)
Hemoglobin: 13.1 g/dL (ref 12.0–15.0)
MCH: 31.5 pg (ref 26.0–34.0)
MCHC: 30.6 g/dL (ref 30.0–36.0)
MCV: 102.9 fL — ABNORMAL HIGH (ref 80.0–100.0)
Platelets: 248 10*3/uL (ref 150–400)
RBC: 4.16 MIL/uL (ref 3.87–5.11)
RDW: 13.7 % (ref 11.5–15.5)
WBC: 10.4 10*3/uL (ref 4.0–10.5)
nRBC: 0 % (ref 0.0–0.2)

## 2023-11-30 MED ORDER — LEVOFLOXACIN 500 MG PO TABS: 500.0000 mg | ORAL_TABLET | Freq: Every day | ORAL | 0 refills | Status: AC

## 2023-11-30 MED ORDER — SACCHAROMYCES BOULARDII 250 MG PO CAPS: 250.0000 mg | ORAL_CAPSULE | Freq: Two times a day (BID) | ORAL | 0 refills | Status: AC

## 2023-11-30 MED ORDER — OXYCODONE HCL 5 MG PO TABS: 5.0000 mg | ORAL_TABLET | Freq: Four times a day (QID) | ORAL | 0 refills | Status: AC | PRN

## 2023-11-30 MED ORDER — PREDNISONE 20 MG PO TABS: ORAL_TABLET | ORAL | 0 refills | Status: DC

## 2023-11-30 NOTE — Progress Notes (Signed)
Transition of Care [facility]) - Inpatient Brief Assessment   Patient Details  Name: Linda Foley MRN: [id number] Date of Birth: 03/03/1966  Transition of [facility]) CM/SW Contact:    Georgie Chard, LCSW Phone Number: 11/30/2023, 9:27 AM   Clinical Narrative: CSW spoke to patient at this time patient just needed o2 ordered to DC home. Patient is active with [employer]. [employer] will bring o2 to patient's room.  There are no further TOC needs    Transition of Care Asessment: Insurance and Status: Insurance coverage has been reviewed Patient has primary care physician: Yes Home environment has been reviewed: yes Prior level of function:: walking Prior/Current Home Services: No current home services Social Determinants of Health Reivew: SDOH reviewed needs interventions (o2 re-ordered) Readmission risk has been reviewed: Yes Transition of care needs: no transition of care needs at this time

## 2023-12-01 NOTE — Discharge Summary (Signed)
[facility]  Physician Discharge Summary   Patient ID: TANEKIA RUGEL MRN: [id number] DOB/AGE: [birth year] 57 y.o.  Admit date: 11/29/2023 Discharge date: 11/30/2023    PCP: Pcp, No  DISCHARGE DIAGNOSES:    COPD with acute exacerbation (HCC)   Tobacco abuse   RECOMMENDATIONS FOR OUTPATIENT FOLLOW UP: Patient instructed to follow-up with her primary care provider. Instructed to stop smoking cigarettes.   Home Health: None Equipment/Devices: Continue with home oxygen.  CODE STATUS: Full code  DISCHARGE CONDITION: fair  Diet recommendation: As before  INITIAL HISTORY: 57 y.o. female with a past medical history of substance abuse, anxiety disorder, COPD, chronic hypoxic respiratory failure on home oxygen at 5 to 6 L/min was recently seen in the emergency department on 12/6 with COPD exacerbation.  She was admitted but then decided to leave AMA.  She came back to the hospital earlier this morning with persistent shortness of breath.  She was noted to be saturating 82% on room air.  Initially required nonrebreather.  She was given nebulizer treatments.  Symptoms persisted so she would require hospitalization for further management.  She denies any chest pain.  Complains of tooth pain in her right tooth.  No nausea or vomiting.  She was told that she would benefit from staying in the hospital and not leaving before she is better.  She understands.   In the emergency department COVID-19, influenza and RSV PCR's were negative.  WBC was noted to be normal.  Venous blood gas showed a pH of 7.28 with pCO2 of 70.  Chest x-ray showed atelectasis.  HOSPITAL COURSE:   Acute COPD exacerbation/acute on chronic respiratory failure with hypoxia: Started on steroids antibiotics and nebulizer treatments.  She was also given nebulized budesonide.  Started improving.  Feels like she is back to baseline.  Will be discharged on steroid taper.  She has nebulizer treatments at home along with  inhalors.  Requesting assistance with replenishing her home oxygen supply.  TOC consulted for same.  Patient was counseled to stop smoking cigarettes.     Tobacco abuse/marijuana abuse: She was counseled.   Patient is very keen on going home today.  She seems to be stable.  TOC was able to arrange home oxygen.  Okay for discharge home today.   PERTINENT LABS:  The results of significant diagnostics from this hospitalization (including imaging, microbiology, ancillary and laboratory) are listed below for reference.    Microbiology: Recent Results (from the past 240 hour(s))  Resp panel by RT-PCR (RSV, Flu A&B, Covid) Anterior Nasal Swab     Status: None   Collection Time: 11/28/23  1:43 AM   Specimen: Anterior Nasal Swab  Result Value Ref Range Status   SARS Coronavirus 2 by RT PCR NEGATIVE NEGATIVE Final    Comment: (NOTE) SARS-CoV-2 target nucleic acids are NOT DETECTED.  The SARS-CoV-2 RNA is generally detectable in upper respiratory specimens during the acute phase of infection. The lowest concentration of SARS-CoV-2 viral copies this assay can detect is 138 copies/mL. A negative result does not preclude SARS-Cov-2 infection and should not be used as the sole basis for treatment or other patient management decisions. A negative result may occur with  improper specimen collection/handling, submission of specimen other than nasopharyngeal swab, presence of viral mutation(s) within the areas targeted by this assay, and inadequate number of viral copies(<138 copies/mL). A negative result must be combined with clinical observations, patient history, and epidemiological information. The expected result is Negative.  Fact Sheet for Patients:  BloggerCourse.com  Fact Sheet for Healthcare Providers:  SeriousBroker.it  This test is no t yet approved or cleared by the [place] FDA and  has been authorized for detection and/or  diagnosis of SARS-CoV-2 by FDA under an Emergency Use Authorization (EUA). This EUA will remain  in effect (meaning this test can be used) for the duration of the COVID-19 declaration under Section 564(b)(1) of the Act, 21 U.S.C.section 360bbb-3(b)(1), unless the authorization is terminated  or revoked sooner.       Influenza A by PCR NEGATIVE NEGATIVE Final   Influenza B by PCR NEGATIVE NEGATIVE Final    Comment: (NOTE) The Xpert Xpress SARS-CoV-2/FLU/RSV plus assay is intended as an aid in the diagnosis of influenza from Nasopharyngeal swab specimens and should not be used as a sole basis for treatment. Nasal washings and aspirates are unacceptable for Xpert Xpress SARS-CoV-2/FLU/RSV testing.  Fact Sheet for Patients: BloggerCourse.com  Fact Sheet for Healthcare Providers: SeriousBroker.it  This test is not yet approved or cleared by the [place] FDA and has been authorized for detection and/or diagnosis of SARS-CoV-2 by FDA under an Emergency Use Authorization (EUA). This EUA will remain in effect (meaning this test can be used) for the duration of the COVID-19 declaration under Section 564(b)(1) of the Act, 21 U.S.C. section 360bbb-3(b)(1), unless the authorization is terminated or revoked.     Resp Syncytial Virus by PCR NEGATIVE NEGATIVE Final    Comment: (NOTE) Fact Sheet for Patients: BloggerCourse.com  Fact Sheet for Healthcare Providers: SeriousBroker.it  This test is not yet approved or cleared by the [place] FDA and has been authorized for detection and/or diagnosis of SARS-CoV-2 by FDA under an Emergency Use Authorization (EUA). This EUA will remain in effect (meaning this test can be used) for the duration of the COVID-19 declaration under Section 564(b)(1) of the Act, 21 U.S.C. section 360bbb-3(b)(1), unless the authorization is terminated  or revoked.  Performed at [facility], [address] [place], [place] [address]      Labs:   Basic Metabolic Panel: Recent Labs  Lab 11/28/23 0215 11/30/23 0330  NA 141 137  K 3.7 4.8  CL 102 102  CO2 29 30  GLUCOSE 141* 143*  BUN 17 26*  CREATININE 0.59 0.70  CALCIUM 8.5* 8.7*    CBC: Recent Labs  Lab 11/28/23 0215 11/30/23 0330  WBC 6.9 10.4  NEUTROABS 5.2  --   HGB 14.8 13.1  HCT 46.8* 42.8  MCV 100.2* 102.9*  PLT 219 248     IMAGING STUDIES DG Chest Port 1 View  Result Date: 11/28/2023 CLINICAL DATA:  Shortness of breath. EXAM: PORTABLE CHEST 1 VIEW COMPARISON:  November 07, 2023 FINDINGS: The heart size and mediastinal contours are within normal limits. The lungs are hyperinflated. Mild atelectatic changes are seen within the lateral aspect of the right lung base. No pleural effusion or pneumothorax is identified. A 9 mm benign-appearing sclerotic focus is seen overlying the shaft of the proximal right humerus. No acute osseous abnormalities are identified. IMPRESSION: Mild right basilar atelectasis. Electronically Signed   By: Aram Candela M.D.   On: 11/28/2023 02:12   DG Chest 2 View  Result Date: 11/07/2023 CLINICAL DATA:  Shortness of breath. EXAM: CHEST - 2 VIEW COMPARISON:  None Available. FINDINGS: The heart size is normal. Changes of COPD are noted. No focal airspace disease is present. No pleural effusion or pneumothorax is present. The visualized soft tissues and bony thorax are unremarkable. IMPRESSION:  COPD without acute cardiopulmonary disease. Electronically Signed   By: Marin Roberts M.D.   On: 11/07/2023 17:21    DISCHARGE EXAMINATION: Vitals:   11/29/23 2000 11/29/23 2107 11/30/23 0215 11/30/23 0936  BP:  103/64 100/65 115/65  Pulse:  94 88 94  Resp:  16 16 16   Temp:  98.3 F (36.8 C) 97.8 F (36.6 C) 98.2 F (36.8 C)  TempSrc:      SpO2: 97% 100% 97% 97%  Weight:      Height:       General  appearance: Awake alert.  In no distress Resp: Mild tachypnea noted without use of accessory muscles.  Coarse breath sounds with few scattered wheezing.  Improved from yesterday. Cardio: S1-S2 is normal regular.  No S3-S4.  No rubs murmurs or bruit GI: Abdomen is soft.  Nontender nondistended.  Bowel sounds are present normal.  No masses organomegaly     DISPOSITION: Home  Discharge Instructions     Call MD for:  difficulty breathing, headache or visual disturbances   Complete by: As directed    Call MD for:  extreme fatigue   Complete by: As directed    Call MD for:  persistant dizziness or light-headedness   Complete by: As directed    Call MD for:  persistant nausea and vomiting   Complete by: As directed    Call MD for:  severe uncontrolled pain   Complete by: As directed    Call MD for:  temperature >100.4   Complete by: As directed    Diet general   Complete by: As directed    Discharge instructions   Complete by: As directed    Please be sure to follow-up with your primary care provider.  Take your medications as prescribed.  You were cared for by a hospitalist during your hospital stay. If you have any questions about your discharge medications or the care you received while you were in the hospital after you are discharged, you can call the unit and asked to speak with the hospitalist on call if the hospitalist that took care of you is not available. Once you are discharged, your primary care physician will handle any further medical issues. Please note that NO REFILLS for any discharge medications will be authorized once you are discharged, as it is imperative that you return to your primary care physician (or establish a relationship with a primary care physician if you do not have one) for your aftercare needs so that they can reassess your need for medications and monitor your lab values. If you do not have a primary care physician, you can call [phone] for a physician  referral.   Increase activity slowly   Complete by: As directed          Allergies as of 11/30/2023       Reactions   Penicillins Nausea And Vomiting   Did it involve swelling of the face/tongue/throat, SOB, or low BP? No Did it involve sudden or severe rash/hives, skin peeling, or any reaction on the inside of your mouth or nose? No Did you need to seek medical attention at a hospital or doctor's office? No When did it last happen?      Several Years Ago If all above answers are "NO", may proceed with cephalosporin use.        Medication List     STOP taking these medications    azithromycin 250 MG tablet Commonly known as: OVFIEPPIR  TAKE these medications    albuterol 108 (90 Base) MCG/ACT inhaler Commonly known as: VENTOLIN HFA Inhale 1-2 puffs into the lungs every 6 (six) hours as needed for wheezing or shortness of breath. What changed: how much to take   albuterol (2.5 MG/3ML) 0.083% nebulizer solution Commonly known as: PROVENTIL Take 3 mLs (2.5 mg total) by nebulization every 6 (six) hours as needed for wheezing or shortness of breath. What changed: Another medication with the same name was changed. Make sure you understand how and when to take each.   levofloxacin 500 MG tablet Commonly known as: LEVAQUIN Take 1 tablet (500 mg total) by mouth daily for 5 days.   oxyCODONE 5 MG immediate release tablet Commonly known as: Oxy IR/ROXICODONE Take 1 tablet (5 mg total) by mouth every 6 (six) hours as needed for up to 3 days for moderate pain (pain score 4-6).   predniSONE 20 MG tablet Commonly known as: DELTASONE Take 3 tablets once daily for 3 days followed by 2 tablets once daily for 3 days followed by 1 tablet once daily for 3 days and then stop What changed:  medication strength additional instructions   saccharomyces boulardii 250 MG capsule Commonly known as: FLORASTOR Take 1 capsule (250 mg total) by mouth 2 (two) times daily for 14  days.           TOTAL DISCHARGE TIME: 35 minutes  Edgar Reisz [employer] on [email or web address]  12/01/2023, 10:49 AM

## 2023-12-27 DIAGNOSIS — J441 Chronic obstructive pulmonary disease with (acute) exacerbation: Secondary | ICD-10-CM | POA: Diagnosis not present

## 2023-12-27 DIAGNOSIS — F1910 Other psychoactive substance abuse, uncomplicated: Secondary | ICD-10-CM | POA: Diagnosis not present

## 2023-12-27 DIAGNOSIS — Z720 Tobacco use: Secondary | ICD-10-CM | POA: Diagnosis not present

## 2023-12-27 DIAGNOSIS — J449 Chronic obstructive pulmonary disease, unspecified: Secondary | ICD-10-CM | POA: Diagnosis not present

## 2024-03-28 ENCOUNTER — Encounter (HOSPITAL_COMMUNITY): Payer: Self-pay | Admitting: Emergency Medicine

## 2024-03-28 ENCOUNTER — Inpatient Hospital Stay (HOSPITAL_COMMUNITY)
Admission: EM | Admit: 2024-03-28 | Discharge: 2024-03-31 | DRG: 190 | Disposition: A | Discharge status: Home / Self Care | Attending: Internal Medicine | Admitting: Internal Medicine

## 2024-03-28 ENCOUNTER — Other Ambulatory Visit: Payer: Self-pay

## 2024-03-28 ENCOUNTER — Emergency Department (HOSPITAL_COMMUNITY)

## 2024-03-28 DIAGNOSIS — Z880 Allergy status to penicillin: Secondary | ICD-10-CM

## 2024-03-28 DIAGNOSIS — Z79899 Other long term (current) drug therapy: Secondary | ICD-10-CM

## 2024-03-28 DIAGNOSIS — F17210 Nicotine dependence, cigarettes, uncomplicated: Secondary | ICD-10-CM | POA: Diagnosis present

## 2024-03-28 DIAGNOSIS — F1210 Cannabis abuse, uncomplicated: Secondary | ICD-10-CM | POA: Diagnosis present

## 2024-03-28 DIAGNOSIS — Z7951 Long term (current) use of inhaled steroids: Secondary | ICD-10-CM | POA: Diagnosis not present

## 2024-03-28 DIAGNOSIS — Z716 Tobacco abuse counseling: Secondary | ICD-10-CM | POA: Diagnosis not present

## 2024-03-28 DIAGNOSIS — T486X6A Underdosing of antiasthmatics, initial encounter: Secondary | ICD-10-CM | POA: Diagnosis present

## 2024-03-28 DIAGNOSIS — J441 Chronic obstructive pulmonary disease with (acute) exacerbation: Principal | ICD-10-CM | POA: Diagnosis present

## 2024-03-28 DIAGNOSIS — F419 Anxiety disorder, unspecified: Secondary | ICD-10-CM | POA: Diagnosis present

## 2024-03-28 DIAGNOSIS — Z9851 Tubal ligation status: Secondary | ICD-10-CM

## 2024-03-28 DIAGNOSIS — Z9981 Dependence on supplemental oxygen: Secondary | ICD-10-CM

## 2024-03-28 DIAGNOSIS — Z98891 History of uterine scar from previous surgery: Secondary | ICD-10-CM | POA: Diagnosis not present

## 2024-03-28 DIAGNOSIS — Z91148 Patient's other noncompliance with medication regimen for other reason: Secondary | ICD-10-CM

## 2024-03-28 DIAGNOSIS — Z5982 Transportation insecurity: Secondary | ICD-10-CM

## 2024-03-28 DIAGNOSIS — Z7982 Long term (current) use of aspirin: Secondary | ICD-10-CM

## 2024-03-28 DIAGNOSIS — Z833 Family history of diabetes mellitus: Secondary | ICD-10-CM | POA: Diagnosis not present

## 2024-03-28 DIAGNOSIS — F1910 Other psychoactive substance abuse, uncomplicated: Secondary | ICD-10-CM | POA: Diagnosis present

## 2024-03-28 DIAGNOSIS — Z9049 Acquired absence of other specified parts of digestive tract: Secondary | ICD-10-CM

## 2024-03-28 DIAGNOSIS — J9621 Acute and chronic respiratory failure with hypoxia: Secondary | ICD-10-CM | POA: Diagnosis present

## 2024-03-28 DIAGNOSIS — Z1152 Encounter for screening for COVID-19: Secondary | ICD-10-CM

## 2024-03-28 DIAGNOSIS — Z604 Social exclusion and rejection: Secondary | ICD-10-CM | POA: Diagnosis present

## 2024-03-28 DIAGNOSIS — Z5941 Food insecurity: Secondary | ICD-10-CM

## 2024-03-28 LAB — TROPONIN I (HIGH SENSITIVITY)
Troponin I (High Sensitivity): 3 ng/L (ref <18)
Troponin I (High Sensitivity): 2 ng/L (ref <18)

## 2024-03-28 LAB — CBC WITH DIFFERENTIAL/PLATELET
Abs Immature Granulocytes: 0.01 10*3/uL (ref 0.00–0.07)
Basophils Absolute: 0.1 10*3/uL (ref 0.0–0.1)
Basophils Relative: 1 %
Eosinophils Absolute: 0.5 10*3/uL (ref 0.0–0.5)
Eosinophils Relative: 7 %
HCT: 52.8 % — ABNORMAL HIGH (ref 36.0–46.0)
Hemoglobin: 16.3 g/dL — ABNORMAL HIGH (ref 12.0–15.0)
Immature Granulocytes: 0 %
Lymphocytes Relative: 24 %
Lymphs Abs: 2 10*3/uL (ref 0.7–4.0)
MCH: 30.1 pg (ref 26.0–34.0)
MCHC: 30.9 g/dL (ref 30.0–36.0)
MCV: 97.4 fL (ref 80.0–100.0)
Monocytes Absolute: 0.8 10*3/uL (ref 0.1–1.0)
Monocytes Relative: 10 %
Neutro Abs: 4.8 10*3/uL (ref 1.7–7.7)
Neutrophils Relative %: 58 %
Platelets: 190 10*3/uL (ref 150–400)
RBC: 5.42 MIL/uL — ABNORMAL HIGH (ref 3.87–5.11)
RDW: 13.8 % (ref 11.5–15.5)
WBC: 8.1 10*3/uL (ref 4.0–10.5)
nRBC: 0 % (ref 0.0–0.2)

## 2024-03-28 LAB — RESP PANEL BY RT-PCR (RSV, FLU A&B, COVID)  RVPGX2
Influenza A by PCR: NEGATIVE
Influenza B by PCR: NEGATIVE
Resp Syncytial Virus by PCR: NEGATIVE
SARS Coronavirus 2 by RT PCR: NEGATIVE

## 2024-03-28 LAB — COMPREHENSIVE METABOLIC PANEL WITH GFR
ALT: 28 U/L (ref 0–44)
AST: 32 U/L (ref 15–41)
Albumin: 3.4 g/dL — ABNORMAL LOW (ref 3.5–5.0)
Alkaline Phosphatase: 44 U/L (ref 38–126)
Anion gap: 6 (ref 5–15)
BUN: 13 mg/dL (ref 6–20)
CO2: 30 mmol/L (ref 22–32)
Calcium: 8.4 mg/dL — ABNORMAL LOW (ref 8.9–10.3)
Chloride: 102 mmol/L (ref 98–111)
Creatinine, Ser: 0.54 mg/dL (ref 0.44–1.00)
GFR, Estimated: >60 mL/min (ref >60)
Glucose, Bld: 126 mg/dL — ABNORMAL HIGH (ref 70–99)
Potassium: 4 mmol/L (ref 3.5–5.1)
Sodium: 138 mmol/L (ref 135–145)
Total Bilirubin: 0.6 mg/dL (ref 0.0–1.2)
Total Protein: 7.5 g/dL (ref 6.5–8.1)

## 2024-03-28 MED ORDER — IPRATROPIUM-ALBUTEROL 0.5-2.5 (3) MG/3ML IN SOLN
3.0000 mL | Freq: Once | RESPIRATORY_TRACT | Status: AC
  Administered 2024-03-28: 3 mL via RESPIRATORY_TRACT
  Filled 2024-03-28: qty 3

## 2024-03-28 MED ORDER — PREDNISONE 50 MG PO TABS: 50.0000 mg | ORAL_TABLET | Freq: Every day | ORAL | Status: DC

## 2024-03-28 MED ORDER — NICOTINE 7 MG/24HR TD PT24
7.0000 mg | MEDICATED_PATCH | Freq: Every day | TRANSDERMAL | Status: DC
Start: 2024-03-29 — End: 2024-03-30
  Administered 2024-03-29 (×2): 7 mg via TRANSDERMAL
  Filled 2024-03-28 (×2): qty 1

## 2024-03-28 MED ORDER — ENOXAPARIN SODIUM 40 MG/0.4ML IJ SOSY
40.0000 mg | PREFILLED_SYRINGE | INTRAMUSCULAR | Status: DC
Start: 2024-03-28 — End: 2024-03-31
  Administered 2024-03-28 – 2024-03-30 (×3): 40 mg via SUBCUTANEOUS
  Filled 2024-03-28 (×3): qty 0.4

## 2024-03-28 MED ORDER — ONDANSETRON HCL 4 MG/2ML IJ SOLN: 4.0000 mg | Freq: Four times a day (QID) | INTRAMUSCULAR | Status: DC | PRN

## 2024-03-28 MED ORDER — ACETAMINOPHEN 650 MG RE SUPP: 650.0000 mg | Freq: Four times a day (QID) | RECTAL | Status: DC | PRN

## 2024-03-28 MED ORDER — ARFORMOTEROL TARTRATE 15 MCG/2ML IN NEBU
15.0000 ug | INHALATION_SOLUTION | Freq: Two times a day (BID) | RESPIRATORY_TRACT | Status: DC
  Administered 2024-03-28 – 2024-03-31 (×6): 15 ug via RESPIRATORY_TRACT
  Filled 2024-03-28 (×6): qty 2

## 2024-03-28 MED ORDER — ONDANSETRON HCL 4 MG PO TABS: 4.0000 mg | ORAL_TABLET | Freq: Four times a day (QID) | ORAL | Status: DC | PRN

## 2024-03-28 MED ORDER — HYDROXYZINE HCL 10 MG PO TABS
10.0000 mg | ORAL_TABLET | Freq: Once | ORAL | Status: AC | PRN
  Administered 2024-03-29: 10 mg via ORAL
  Filled 2024-03-28: qty 1

## 2024-03-28 MED ORDER — GUAIFENESIN-DM 100-10 MG/5ML PO SYRP
5.0000 mL | ORAL_SOLUTION | ORAL | Status: DC | PRN
  Administered 2024-03-29: 5 mL via ORAL
  Filled 2024-03-28: qty 10

## 2024-03-28 MED ORDER — AZITHROMYCIN 500 MG PO TABS
500.0000 mg | ORAL_TABLET | Freq: Every day | ORAL | Status: DC
  Administered 2024-03-28 – 2024-03-30 (×3): 500 mg via ORAL
  Filled 2024-03-28 (×3): qty 2

## 2024-03-28 MED ORDER — IPRATROPIUM-ALBUTEROL 0.5-2.5 (3) MG/3ML IN SOLN
3.0000 mL | Freq: Four times a day (QID) | RESPIRATORY_TRACT | Status: DC
  Administered 2024-03-28 – 2024-03-29 (×4): 3 mL via RESPIRATORY_TRACT
  Filled 2024-03-28 (×4): qty 3

## 2024-03-28 MED ORDER — OXYCODONE HCL 5 MG PO TABS
5.0000 mg | ORAL_TABLET | ORAL | Status: DC | PRN
  Administered 2024-03-28 – 2024-03-31 (×9): 5 mg via ORAL
  Filled 2024-03-28 (×9): qty 1

## 2024-03-28 MED ORDER — BUDESONIDE 0.25 MG/2ML IN SUSP
0.2500 mg | Freq: Two times a day (BID) | RESPIRATORY_TRACT | Status: DC
  Administered 2024-03-28: 0.25 mg via RESPIRATORY_TRACT
  Filled 2024-03-28: qty 2

## 2024-03-28 MED ORDER — ACETAMINOPHEN 325 MG PO TABS: 650.0000 mg | ORAL_TABLET | Freq: Four times a day (QID) | ORAL | Status: DC | PRN

## 2024-03-28 MED ORDER — PREDNISONE 50 MG PO TABS
50.0000 mg | ORAL_TABLET | Freq: Every day | ORAL | Status: DC
  Administered 2024-03-28 – 2024-03-31 (×4): 50 mg via ORAL
  Filled 2024-03-28 (×4): qty 1

## 2024-03-28 NOTE — ED Notes (Signed)
 Patient reports using 7 L (give or take) of oxygen at home.

## 2024-03-28 NOTE — ED Provider Notes (Signed)
 [place] EMERGENCY DEPARTMENT AT [facility] Provider Note   CSN: [id number] Arrival date & time: 03/28/24  1352     History  Chief Complaint  Patient presents with   Shortness of Breath    Linda Foley is a 58 y.o. female, hx of COPD, polysubstance use, who presents to the ED secondary to severe shortness of breath, cough, and runny nose has been going on for the last couple days.  She Si Gaul states she ran out of her inhalers, and has not been taking them.  Typically wears 5 to 7 L of O2 at home.  States that her shortness of breath is gotten worse since she has been off her inhalers.  Denies any sick contacts.  Denies any current IV drug use.  Denies any chest pain, but she states that she feels very congested.  Has been coughing up more sputum recently.  Denies any fevers or chills however.  Home Medications Prior to Admission medications   Medication Sig Start Date End Date Taking? Authorizing Provider  albuterol (PROVENTIL) (2.5 MG/3ML) 0.083% nebulizer solution Take 3 mLs (2.5 mg total) by nebulization every 6 (six) hours as needed for wheezing or shortness of breath. 11/07/23  Yes Durwin Glaze, MD  albuterol (VENTOLIN HFA) 108 (90 Base) MCG/ACT inhaler Inhale 1-2 puffs into the lungs every 6 (six) hours as needed for wheezing or shortness of breath. Patient taking differently: Inhale 2 puffs into the lungs every 6 (six) hours as needed for wheezing or shortness of breath. 11/07/23  Yes Durwin Glaze, MD  aspirin EC 81 MG tablet Take 81 mg by mouth daily as needed for mild pain (pain score 1-3) or moderate pain (pain score 4-6). Swallow whole.   Yes [provider]      Allergies    Penicillins    Review of Systems   Review of Systems  Constitutional:  Negative for fever.  Respiratory:  Positive for cough and shortness of breath.     Physical Exam Updated Vital Signs BP 128/71   Pulse 80   Temp (!) 97.5 F (36.4 C) (Oral)   Resp 15   LMP  12/12/2011   SpO2 100%  Physical Exam Vitals and nursing note reviewed.  Constitutional:      General: She is not in acute distress.    Appearance: She is well-developed.  HENT:     Head: Normocephalic and atraumatic.  Eyes:     Conjunctiva/sclera: Conjunctivae normal.  Cardiovascular:     Rate and Rhythm: Normal rate and regular rhythm.     Heart sounds: No murmur heard. Pulmonary:     Effort: Pulmonary effort is normal. No respiratory distress.     Breath sounds: Examination of the right-upper field reveals wheezing. Examination of the left-upper field reveals wheezing. Examination of the right-lower field reveals wheezing. Examination of the left-lower field reveals wheezing. Wheezing present.  Abdominal:     Palpations: Abdomen is soft.     Tenderness: There is no abdominal tenderness.  Musculoskeletal:        General: No swelling.     Cervical back: Neck supple.  Skin:    General: Skin is warm and dry.     Capillary Refill: Capillary refill takes less than 2 seconds.  Neurological:     Mental Status: She is alert.  Psychiatric:        Mood and Affect: Mood normal.     ED Results / Procedures / Treatments   Labs (  all labs ordered are listed, but only abnormal results are displayed) Labs Reviewed  CBC WITH DIFFERENTIAL/PLATELET - Abnormal; Notable for the following components:      Result Value   RBC 5.42 (*)    Hemoglobin 16.3 (*)    HCT 52.8 (*)    All other components within normal limits  COMPREHENSIVE METABOLIC PANEL WITH GFR - Abnormal; Notable for the following components:   Glucose, Bld 126 (*)    Calcium 8.4 (*)    Albumin 3.4 (*)    All other components within normal limits  RESP PANEL BY RT-PCR (RSV, FLU A&B, COVID)  [id number]  TROPONIN I (HIGH SENSITIVITY)  TROPONIN I (HIGH SENSITIVITY)    EKG None  Radiology DG Chest Port 1 View Result Date: 03/28/2024 CLINICAL DATA:  Worsening shortness of breath. EXAM: PORTABLE CHEST 1 VIEW COMPARISON:   06/12/2023. FINDINGS: The heart size and mediastinal contours are hyperinflation of the lungs is noted. No consolidation, effusion, or pneumothorax is seen. The bony structures appear stable. IMPRESSION: A stable chest with no active disease. Electronically Signed   By: Thornell Sartorius M.D.   On: 03/28/2024 14:42    Procedures Procedures    Medications Ordered in ED Medications  ipratropium-albuterol (DUONEB) 0.5-2.5 (3) MG/3ML nebulizer solution 3 mL (3 mLs Nebulization Given 03/28/24 1423)  ipratropium-albuterol (DUONEB) 0.5-2.5 (3) MG/3ML nebulizer solution 3 mL (3 mLs Nebulization Given 03/28/24 1535)    ED Course/ Medical Decision Making/ A&P                                 Medical Decision Making Patient is a 58 year old-year-old female, history of polysubstance abuse, shortness of breath, has been out of inhalers for the last 2 days.  Reports, increased shortness of breath, productive cough, has been going on, she is diffusely wheezing, and has rhonchi, is on 7 L of O2 currently, which when she wears 5 to 7 L of O2 at home.  EMS is already given her Atrovent, albuterol, she was started on a continuous neb, when she came to the ER, and she has received magnesium and Solu-Medrol, prior to me seeing her.  She will need a chest x-ray, troponin, EKG, and COVID/flu, given worsening shortness of breath, with productive cough.  Amount and/or Complexity of Data Reviewed Labs: ordered.    Details: Labs unremarkable, troponin negative Radiology: ordered.    Details: Chest x-ray shows no acute findings ECG/medicine tests:  Decision-making details documented in ED Course. Discussion of management or test interpretation with external provider(s): Discussed with patient, she is not feeling any better after the DuoNeb, this makes a 3rd or 4th nebulizer that she has had without any improvement, she is already had magnesium, and Solu-Medrol, she is not improving at all.  She still on 7 L of O2, which she  says she only wears when she is really bad.  Typically only wears 5 L of O2 at home.  We will admit to hospitalist, for COPD exacerbation, admitted to Dr. Avie Arenas  Risk Prescription drug management. Decision regarding hospitalization.    Final Clinical Impression(s) / ED Diagnoses Final diagnoses:  COPD exacerbation [facility])    Rx / DC Orders ED Discharge Orders     None         Harjot Zavadil, Harley [place], PA 03/28/24 1602    Glyn Ade, MD 03/28/24 2116

## 2024-03-28 NOTE — H&P (Signed)
 History and Physical    Linda Foley WGN:[id number] DOB: Jan 25, 1966 DOA: 03/28/2024  PCP: Patient, No Pcp Per   Chief Complaint:  sob  HPI: Linda Foley is a 58 y.o. female with medical history significant of COPD who presents emergency department due to shortness of breath.  Patient developed cough runny nose shortness of breath over the last several days.  She typically wears 5 L of oxygen at home and ran out of her inhalers.  She presented to the ER for further assessment.  On arrival she was found to be hypoxic with wheezing satting in the 80s.  Labs were obtained which demonstrated WBC 8.1, hemoglobin 16.3, platelets 190, CMP unrevealing, troponin 3.  Patient underwent chest x-ray which showed no acute findings.  Patient was admitted for further workup of COPD exacerbation.  On evaluation she had persistent wheezing cough and shortness of breath.  She denied fevers or chills.  She was on her home oxygen.   Review of Systems: Review of Systems  Constitutional: Negative.  Negative for chills and fever.  HENT: Negative.    Eyes: Negative.   Respiratory:  Positive for cough, shortness of breath and wheezing.   Cardiovascular: Negative.   Gastrointestinal: Negative.   Genitourinary: Negative.   Musculoskeletal: Negative.   Skin: Negative.   Neurological: Negative.   Endo/Heme/Allergies: Negative.   Psychiatric/Behavioral: Negative.       As per HPI otherwise 10 point review of systems negative.   Allergies  Allergen Reactions   Penicillins Nausea And Vomiting    Did it involve swelling of the face/tongue/throat, SOB, or low BP? No Did it involve sudden or severe rash/hives, skin peeling, or any reaction on the inside of your mouth or nose? No Did you need to seek medical attention at a hospital or doctor's office? No When did it last happen?      Several Years Ago If all above answers are "NO", may proceed with cephalosporin use.      Past Medical History:  Diagnosis  Date   Anxiety    COPD (chronic obstructive pulmonary disease) (HCC)    Polysubstance abuse (HCC)     Past Surgical History:  Procedure Laterality Date   CESAREAN SECTION     CHOLECYSTECTOMY     TUBAL LIGATION       reports that she has been smoking cigarettes. She has a 15 pack-year smoking history. She has never used smokeless tobacco. She reports current drug use. Drug: Marijuana. She reports that she does not drink alcohol.  Family History  Problem Relation Age of Onset   Diabetes Mother     Prior to Admission medications   Medication Sig Start Date End Date Taking? Authorizing Provider  albuterol (PROVENTIL) (2.5 MG/3ML) 0.083% nebulizer solution Take 3 mLs (2.5 mg total) by nebulization every 6 (six) hours as needed for wheezing or shortness of breath. 11/07/23  Yes Durwin Glaze, MD  albuterol (VENTOLIN HFA) 108 (90 Base) MCG/ACT inhaler Inhale 1-2 puffs into the lungs every 6 (six) hours as needed for wheezing or shortness of breath. Patient taking differently: Inhale 2 puffs into the lungs every 6 (six) hours as needed for wheezing or shortness of breath. 11/07/23  Yes Durwin Glaze, MD  aspirin EC 81 MG tablet Take 81 mg by mouth daily as needed for mild pain (pain score 1-3) or moderate pain (pain score 4-6). Swallow whole.   Yes [provider]    Physical Exam: Vitals:   03/28/24 1404  03/28/24 1500  BP: 123/83 128/71  Pulse: 80 80  Resp: 18 15  Temp: (!) 97.5 F (36.4 C)   TempSrc: Oral   SpO2: 97% 100%   Physical Exam Constitutional:      Appearance: She is normal weight.  HENT:     Head: Normocephalic.     Mouth/Throat:     Mouth: Mucous membranes are moist.  Eyes:     Pupils: Pupils are equal, round, and reactive to light.  Cardiovascular:     Rate and Rhythm: Normal rate and regular rhythm.  Pulmonary:     Effort: Tachypnea and respiratory distress present.     Breath sounds: Wheezing present.  Abdominal:     General: Bowel sounds are  normal.     Palpations: Abdomen is soft.  Musculoskeletal:        General: Normal range of motion.     Cervical back: Normal range of motion.  Skin:    General: Skin is warm.     Capillary Refill: Capillary refill takes less than 2 seconds.  Neurological:     General: No focal deficit present.     Mental Status: She is alert.        Labs on Admission: I have personally reviewed the patients's labs and imaging studies.  Assessment/Plan Principal Problem:   COPD exacerbation (HCC)   # Acute Hypoxia resp failure most likely secondary to COPD exacerbation - Patient has heavy smoking history - On 5 L oxygen at baseline - Only using albuterol at home and ran out - No infiltrates on chest x-ray - Wheezing  Plan: Scheduled DuoNebs Pulmicort and Brovana Azithromycin and prednisone  # Tobacco abuse-encourage cessation  Admission status: Inpatient Telemetry  Certification: The appropriate patient status for this patient is INPATIENT. Inpatient status is judged to be reasonable and necessary in order to provide the required intensity of service to ensure the patient's safety. The patient's presenting symptoms, physical exam findings, and initial radiographic and laboratory data in the context of their chronic comorbidities is felt to place them at high risk for further clinical deterioration. Furthermore, it is not anticipated that the patient will be medically stable for discharge from the hospital within 2 midnights of admission.   * I certify that at the point of admission it is my clinical judgment that the patient will require inpatient hospital care spanning beyond 2 midnights from the point of admission due to high intensity of service, high risk for further deterioration and high frequency of surveillance required.Alan Mulder MD [facility] If 7PM-7AM, please contact night-coverage [email or web address]  03/28/2024, 4:32 PM

## 2024-03-28 NOTE — ED Triage Notes (Signed)
 Patient presents from home due to shortness of breath that started 2 days ago and has worsened. She ran out of inhalers at home and normally uses O2. EMS reports rhonchi and wheezing. SPO2 was originally 86% with first contact on her porch. They administered 10 mg of Albuterol, Atrovent, 2 g magnesium, 125 mg of solumedrol.     Hx: COPD, Bronchitis,    EMS vitals: 99% SPO2 on NEB 137/79 BP 80 P

## 2024-03-29 DIAGNOSIS — J441 Chronic obstructive pulmonary disease with (acute) exacerbation: Secondary | ICD-10-CM | POA: Diagnosis not present

## 2024-03-29 LAB — MRSA NEXT GEN BY PCR, NASAL: MRSA by PCR Next Gen: DETECTED — AB

## 2024-03-29 MED ORDER — BUDESONIDE 0.5 MG/2ML IN SUSP
0.5000 mg | Freq: Two times a day (BID) | RESPIRATORY_TRACT | Status: DC
  Administered 2024-03-29 – 2024-03-31 (×5): 0.5 mg via RESPIRATORY_TRACT
  Filled 2024-03-29 (×5): qty 2

## 2024-03-29 MED ORDER — NICOTINE 7 MG/24HR TD PT24
7.0000 mg | MEDICATED_PATCH | Freq: Every day | TRANSDERMAL | Status: DC
  Administered 2024-03-29 – 2024-03-31 (×3): 7 mg via TRANSDERMAL
  Filled 2024-03-29 (×3): qty 1

## 2024-03-29 MED ORDER — NICOTINE 7 MG/24HR TD PT24: 7.0000 mg | MEDICATED_PATCH | Freq: Every day | TRANSDERMAL | Status: DC

## 2024-03-29 MED ORDER — ALUM & MAG HYDROXIDE-SIMETH 200-200-20 MG/5ML PO SUSP
15.0000 mL | ORAL | Status: DC | PRN
Start: 2024-03-29 — End: 2024-03-30
  Administered 2024-03-29 – 2024-03-30 (×2): 15 mL via ORAL
  Filled 2024-03-29 (×2): qty 30

## 2024-03-29 MED ORDER — GABAPENTIN 100 MG PO CAPS
200.0000 mg | ORAL_CAPSULE | Freq: Three times a day (TID) | ORAL | Status: DC
  Administered 2024-03-29 – 2024-03-30 (×4): 200 mg via ORAL
  Filled 2024-03-29 (×4): qty 2

## 2024-03-29 MED ORDER — METHOCARBAMOL 500 MG PO TABS
500.0000 mg | ORAL_TABLET | Freq: Four times a day (QID) | ORAL | Status: DC | PRN
  Administered 2024-03-30 – 2024-03-31 (×2): 500 mg via ORAL
  Filled 2024-03-29 (×2): qty 1

## 2024-03-29 MED ORDER — ALBUTEROL SULFATE (2.5 MG/3ML) 0.083% IN NEBU
2.5000 mg | INHALATION_SOLUTION | RESPIRATORY_TRACT | Status: DC | PRN
  Administered 2024-03-30: 2.5 mg via RESPIRATORY_TRACT
  Filled 2024-03-29: qty 3

## 2024-03-29 MED ORDER — IPRATROPIUM-ALBUTEROL 0.5-2.5 (3) MG/3ML IN SOLN
3.0000 mL | Freq: Two times a day (BID) | RESPIRATORY_TRACT | Status: DC
  Administered 2024-03-29 – 2024-03-31 (×4): 3 mL via RESPIRATORY_TRACT
  Filled 2024-03-29 (×4): qty 3

## 2024-03-29 MED ORDER — CHLORHEXIDINE GLUCONATE CLOTH 2 % EX PADS
6.0000 | MEDICATED_PAD | Freq: Every day | CUTANEOUS | Status: DC
  Administered 2024-03-29 – 2024-03-31 (×3): 6 via TOPICAL

## 2024-03-29 MED ORDER — ORAL CARE MOUTH RINSE: 15.0000 mL | OROMUCOSAL | Status: DC | PRN

## 2024-03-29 MED ORDER — MUPIROCIN 2 % EX OINT
1.0000 | TOPICAL_OINTMENT | Freq: Two times a day (BID) | CUTANEOUS | Status: DC
  Administered 2024-03-29 – 2024-03-31 (×5): 1 via NASAL
  Filled 2024-03-29: qty 22

## 2024-03-29 NOTE — TOC Initial Note (Signed)
 Transition of Care [facility]) - Initial/Assessment Note   Patient Details  Name: Linda Foley MRN: [id number] Date of Birth: January 29, 1966  Transition of Care [facility]) CM/SW Contact:    Ewing Schlein, LCSW Phone Number: 03/29/2024, 2:01 PM  Clinical Narrative: Patient resides in a hotel. Patient is active with [employer] for home oxygen. Patient is on 5L/min at baseline and has a 10L concentrator. Food pantry and transportation resources added to AVS. PT/OT consulted. TOC following for recommendations.  Expected Discharge Plan:  (TBD) Barriers to Discharge: Continued Medical Work up  Expected Discharge Plan and Services In-house Referral: Clinical Social Work Living arrangements for the past 2 months: Hotel/Motel  Prior Living Arrangements/Services Living arrangements for the past 2 months: Hotel/Motel Lives with:: Self Patient language and need for interpreter reviewed:: Yes Do you feel safe going back to the place where you live?: Yes      Need for Family Participation in Patient Care: No (Comment) Care giver support system in place?: Yes (comment) Current home services: DME (Home oxygen through [employer].) Criminal Activity/Legal Involvement Pertinent to Current Situation/Hospitalization: No - Comment as needed  Activities of Daily Living ADL Screening (condition at time of admission) Independently performs ADLs?: Yes (appropriate for developmental age) Is the patient deaf or have difficulty hearing?: No Does the patient have difficulty seeing, even when wearing glasses/contacts?: No Does the patient have difficulty concentrating, remembering, or making decisions?: No  Emotional Assessment Orientation: : Oriented to Self, Oriented to Place, Oriented to  Time, Oriented to Situation Psych Involvement: No (comment)  Admission diagnosis:  COPD exacerbation (HCC) [J44.1] Patient Active Problem List   Diagnosis Date Noted   Incontinence in female 10/24/2023   Polycythemia 10/24/2023    Hypocalcemia 10/24/2023   Hyperglycemia 10/24/2023   MRSA carrier 10/24/2023   COPD exacerbation (HCC) 02/21/2022   Tobacco abuse 02/21/2022   COPD with acute exacerbation (HCC) 06/20/2018   Polysubstance abuse (HCC) 06/20/2018   PCP:  Patient, No Pcp Per Pharmacy:   [facility] [address], [place] - [address] [place] [address] Phone: [phone] Fax: [phone]  [facility] [address] ([place]), [place] - [address] [place]) [place] [address] Phone: [phone] Fax: [phone]  Social Drivers of Health (SDOH) Social History: SDOH Screenings   Food Insecurity: Food Insecurity Present (03/28/2024)  Housing: Low Risk  (03/28/2024)  Transportation Needs: Unmet Transportation Needs (03/28/2024)  Utilities: Not At Risk (03/28/2024)  Social Connections: Socially Isolated (03/28/2024)  Tobacco Use: High Risk (03/28/2024)   SDOH Interventions: Food Insecurity Interventions: Inpatient TOC, Other (Comment) (Food pantry resources added to AVS.) Transportation Interventions: Inpatient TOC, Other (Comment) (Medicaid transportation information added to AVS.) Social Connections Interventions: Intervention Not Indicated, Inpatient TOC  Readmission Risk Interventions    03/29/2024    1:56 PM  Readmission Risk Prevention Plan  Transportation Screening Complete  HRI or Home Care Consult Complete  Social Work Consult for Recovery Care Planning/Counseling Complete  Palliative Care Screening Not Applicable  Medication Review Ocean[employer]) Complete

## 2024-03-29 NOTE — Progress Notes (Signed)
 PROGRESS NOTE    Linda Foley  ZDG:[id number] DOB: [birth year] DOA: 03/28/2024 PCP: Patient, No Pcp Per    Brief Narrative:   Linda Foley is a 58 y.o. female with past medical history significant for COPD/chronic hypoxic respiratory failure on 5 L [place] at baseline (reports recently ran out of her albuterol inhaler/nebulizer solution) who presented to [facility] ED on 03/28/2024 from home via EMS with shortness of breath.  Patient reports developed cough, nasal congestion over the last several days.  On EMS arrival, patient was noted to be hypoxic with wheezing with SpO2 in the 80s. Patient was given albuterol, Atrovent, 2 g IV magnesium, 125 mg IV Solu-Medrol by EMS.  And transported to the ED.   In the ED, Trinate 7.5 F, HR 80, RR 18, BP 123/83, SpO2 91% on 6 L nasal cannula.  WBC 8.1, hemoglobin 16.3, platelet count 190.  Sodium 138, potassium 4.0, chloride 102, CO2 30, glucose 126, BUN 13, creatinine 0.54.  AST 32, ALT 28, total bilirubin 0.6.  High sensitive troponin 3> 2.  COVID/influenza/RSV PCR negative.  Chest x-ray with stable chest, no active disease, noted hyperinflation with no consolidation or effusion.  Patient received DuoNeb treatments x 2.  TRH consulted for admission for further evaluation management of acute on chronic hypoxic respiratory failure/COPD exacerbation.  Assessment & Plan:   Acute on chronic hypoxic respiratory failure, POA COPD exacerbation Patient Linda Foley ED with progressive shortness of breath, cough and nasal congestion.  Was noted to be hypoxic with SpO2 in the 80s per EMS.  Complicated by patient running out of her home albuterol inhaler/nebulized medicine. -- Azithromycin 500 mg p.o. daily -- Brovana neb twice daily -- Pulmicort neb twice daily -- DuoNeb scheduled every 2 hours -- Prednisone 50 mg p.o. daily -- Albuterol neb every 4 hours as needed wheezing/shortness of breath  -- Continue supplemental oxygen, maintain SpO2 greater than 88%,  on 5 L nasal cannula at baseline  Tobacco use disorder Counseled on need for complete cessation/abstinence. -- Nicotine patch   DVT prophylaxis: enoxaparin (LOVENOX) injection 40 mg Start: 03/28/24 2200 SCDs Start: 03/28/24 1629    Code Status: Full Code Family Communication: No family present at bedside  Disposition Plan:  Level of care: Telemetry Status is: Inpatient Remains inpatient appropriate because: Scheduled neb treatments    Consultants:  None  Procedures:  None  Antimicrobials:  Azithromycin 4/6>>   Subjective: Patient seen examined bedside, lying in bed.  Continues with shortness of breath/wheezing, oxygen now down to 4 L nasal cannula.  Patient reports needs refills of her home albuterol.  No other specific questions, concerns or complaints at this time.  Denies headache, no dizziness, no chest pain, no palpitations, no abdominal pain, no fever/chills/night sweats, no nausea/vomiting/diarrhea, no focal weakness, no fatigue, no paresthesia.  No acute events overnight per nurse staff.  Objective: Vitals:   03/29/24 0749 03/29/24 0809 03/29/24 0812 03/29/24 1304  BP:    122/72  Pulse:    98  Resp:    16  Temp:    98.9 F (37.2 C)  TempSrc:    Oral  SpO2: 98% 96% 95% 97%  Weight:      Height:        Intake/Output Summary (Last 24 hours) at 03/29/2024 1307 Last data filed at 03/28/2024 2330 Gross per 24 hour  Intake 600 ml  Output --  Net 600 ml   Filed Weights   03/28/24 1842  Weight: 47.1 kg  Examination:  Physical Exam: GEN: NAD, alert and oriented x 3, chronically ill/thin in appearance, appears older than stated age HEENT: NCAT, PERRL, EOMI, sclera clear, MMM PULM: Breath sounds diminished bilateral bases with wheezing throughout all lung fields, normal respiratory effort without accessory muscle use, on 4 L nasal cannula (5L baseline) CV: RRR w/o M/G/R GI: abd soft, NTND, + BS MSK: no peripheral edema, moves all extremities  independently NEURO: CN II-XII intact, no focal deficits, sensation to light touch intact PSYCH: normal mood/affect Integumentary: No concerning rashes/lesions/wounds nonossified skin surfaces    Data Reviewed: I have personally reviewed following labs and imaging studies  CBC: Recent Labs  Lab 03/28/24 1422  WBC 8.1  NEUTROABS 4.8  HGB 16.3*  HCT 52.8*  MCV 97.4  PLT 190   Basic Metabolic Panel: Recent Labs  Lab 03/28/24 1422  NA 138  K 4.0  CL 102  CO2 30  GLUCOSE 126*  BUN 13  CREATININE 0.54  CALCIUM 8.4*   GFR: Estimated Creatinine Clearance: 58.4 mL/min (by C-G formula based on SCr of 0.54 mg/dL). Liver Function Tests: Recent Labs  Lab 03/28/24 1422  AST 32  ALT 28  ALKPHOS 44  BILITOT 0.6  PROT 7.5  ALBUMIN 3.4*   No results for input(s): "LIPASE", "AMYLASE" in the last 168 hours. No results for input(s): "AMMONIA" in the last 168 hours. Coagulation Profile: No results for input(s): "INR", "PROTIME" in the last 168 hours. Cardiac Enzymes: No results for input(s): "CKTOTAL", "CKMB", "CKMBINDEX", "TROPONINI" in the last 168 hours. BNP (last 3 results) No results for input(s): "PROBNP" in the last 8760 hours. HbA1C: No results for input(s): "HGBA1C" in the last 72 hours. CBG: No results for input(s): "GLUCAP" in the last 168 hours. Lipid Profile: No results for input(s): "CHOL", "HDL", "LDLCALC", "TRIG", "CHOLHDL", "LDLDIRECT" in the last 72 hours. Thyroid Function Tests: No results for input(s): "TSH", "T4TOTAL", "FREET4", "T3FREE", "THYROIDAB" in the last 72 hours. Anemia Panel: No results for input(s): "VITAMINB12", "FOLATE", "FERRITIN", "TIBC", "IRON", "RETICCTPCT" in the last 72 hours. Sepsis Labs: No results for input(s): "PROCALCITON", "LATICACIDVEN" in the last 168 hours.  Recent Results (from the past 240 hours)  Resp panel by RT-PCR (RSV, Flu A&B, Covid) Anterior Nasal Swab     Status: None   Collection Time: 03/28/24  2:22 PM    Specimen: Anterior Nasal Swab  Result Value Ref Range Status   SARS Coronavirus 2 by RT PCR NEGATIVE NEGATIVE Final    Comment: (NOTE) SARS-CoV-2 target nucleic acids are NOT DETECTED.  The SARS-CoV-2 RNA is generally detectable in upper respiratory specimens during the acute phase of infection. The lowest concentration of SARS-CoV-2 viral copies this assay can detect is 138 copies/mL. A negative result does not preclude SARS-Cov-2 infection and should not be used as the sole basis for treatment or other patient management decisions. A negative result may occur with  improper specimen collection/handling, submission of specimen other than nasopharyngeal swab, presence of viral mutation(s) within the areas targeted by this assay, and inadequate number of viral copies(<138 copies/mL). A negative result must be combined with clinical observations, patient history, and epidemiological information. The expected result is Negative.  Fact Sheet for Patients:  BloggerCourse.com  Fact Sheet for Healthcare Providers:  SeriousBroker.it  This test is no t yet approved or cleared by the [place] FDA and  has been authorized for detection and/or diagnosis of SARS-CoV-2 by FDA under an Emergency Use Authorization (EUA). This EUA will remain  in effect (meaning  this test can be used) for the duration of the COVID-19 declaration under Section 564(b)(1) of the Act, 21 U.S.C.section 360bbb-3(b)(1), unless the authorization is terminated  or revoked sooner.       Influenza A by PCR NEGATIVE NEGATIVE Final   Influenza B by PCR NEGATIVE NEGATIVE Final    Comment: (NOTE) The Xpert Xpress SARS-CoV-2/FLU/RSV plus assay is intended as an aid in the diagnosis of influenza from Nasopharyngeal swab specimens and should not be used as a sole basis for treatment. Nasal washings and aspirates are unacceptable for Xpert Xpress  SARS-CoV-2/FLU/RSV testing.  Fact Sheet for Patients: BloggerCourse.com  Fact Sheet for Healthcare Providers: SeriousBroker.it  This test is not yet approved or cleared by the [place] FDA and has been authorized for detection and/or diagnosis of SARS-CoV-2 by FDA under an Emergency Use Authorization (EUA). This EUA will remain in effect (meaning this test can be used) for the duration of the COVID-19 declaration under Section 564(b)(1) of the Act, 21 U.S.C. section 360bbb-3(b)(1), unless the authorization is terminated or revoked.     Resp Syncytial Virus by PCR NEGATIVE NEGATIVE Final    Comment: (NOTE) Fact Sheet for Patients: BloggerCourse.com  Fact Sheet for Healthcare Providers: SeriousBroker.it  This test is not yet approved or cleared by the [place] FDA and has been authorized for detection and/or diagnosis of SARS-CoV-2 by FDA under an Emergency Use Authorization (EUA). This EUA will remain in effect (meaning this test can be used) for the duration of the COVID-19 declaration under Section 564(b)(1) of the Act, 21 U.S.C. section 360bbb-3(b)(1), unless the authorization is terminated or revoked.  Performed at [facility], [address], [place], [place] [address]   MRSA Next Gen by PCR, Nasal     Status: Abnormal   Collection Time: 03/29/24  2:34 AM   Specimen: Nasal Mucosa; Nasal Swab  Result Value Ref Range Status   MRSA by PCR Next Gen DETECTED (A) NOT DETECTED Final    Comment: CRITICAL RESULT CALLED TO, READ BACK BY AND VERIFIED WITH: HILDA ANG, RN @ [phone] 03/29/24 MH (NOTE) The GeneXpert MRSA Assay (FDA approved for NASAL specimens only), is one component of a comprehensive MRSA colonization surveillance program. It is not intended to diagnose MRSA infection nor to guide or monitor treatment for MRSA infections. Test performance  is not FDA approved in patients less than 96 years old. Performed at [facility], [address] [place], [place] [address]          Radiology Studies: [facility] Chest Port 1 View Result Date: 03/28/2024 CLINICAL DATA:  Worsening shortness of breath. EXAM: PORTABLE CHEST 1 VIEW COMPARISON:  06/12/2023. FINDINGS: The heart size and mediastinal contours are hyperinflation of the lungs is noted. No consolidation, effusion, or pneumothorax is seen. The bony structures appear stable. IMPRESSION: A stable chest with no active disease. Electronically Signed   By: Thornell Sartorius M.D.   On: 03/28/2024 14:42        Scheduled Meds:  arformoterol  15 mcg Nebulization BID   azithromycin  500 mg Oral Daily   budesonide (PULMICORT) nebulizer solution  0.5 mg Nebulization BID   Chlorhexidine Gluconate Cloth  6 each Topical Daily   enoxaparin (LOVENOX) injection  40 mg Subcutaneous Q24H   gabapentin  200 mg Oral TID   ipratropium-albuterol  3 mL Nebulization BID   mupirocin ointment  1 Application Nasal BID   nicotine  7 mg Transdermal Daily   predniSONE  50  mg Oral Q breakfast   Continuous Infusions:   LOS: 1 day    Time spent: 52 minutes spent on 03/29/2024 caring for this patient face-to-face including chart review, ordering labs/tests, documenting, discussion with nursing staff, consultants, updating family and interview/physical exam    Alvira Philips [place], DO [facility] Available via Epic secure chat 7am-7pm After these hours, please refer to coverage provider listed on [email or web address] 03/29/2024, 1:07 PM

## 2024-03-29 NOTE — Evaluation (Signed)
 Physical Therapy Evaluation Patient Details Name: Linda Foley MRN: [id number] DOB: [birth year] Today's Date: 03/29/2024  History of Present Illness  Linda Foley is a 58 y.o. female admitted with COPD exacerbation. PMH: COPD, polysubstance use  Clinical Impression  Pt admitted with above diagnosis. Pt reports from extended stay hotel, lives on 2nd level and has flight of stairs to navigate, reports brother moved in with her and complete household chores, pt reports hasn't ambulated in a year due to SOB, reports on 6L O2 at baseline. On eval, pt initially restless and somewhat impulsive, attempting to transfer to [facility] prior to RW in position and therapist present to assist, then removes O2 nasal cannula while standing in order to ambulate into restroom, heavy education regarding maintaining o2 tubing and BSC positioned for quick voiding as pt requested. Once back in bed, pt reclines back and closes eyes, answers therapist's questions with eyes closed, denies any complaints, requests sprite to drink- RN notified of variety.  Pt on 4L O2 at 91% upon arrival, desat to 85% with transfer, improves to 88% with seated pursed lip breathing, increased to 5L and SpO2 91%- RN notified. Recommend HHPT with continued brother support at home; will continue to assess DME needs pending progress in hospital. Pt currently with functional limitations due to the deficits listed below (see PT Problem List). Pt will benefit from acute skilled PT to increase their independence and safety with mobility to allow discharge.           If plan is discharge home, recommend the following: A little help with walking and/or transfers;A little help with bathing/dressing/bathroom;Assistance with cooking/housework;Assist for transportation;Help with stairs or ramp for entrance   Can travel by private vehicle        Equipment Recommendations Other (comment) (rollator possibly pending trial)  Recommendations for Other  Services       Functional Status Assessment Patient has had a recent decline in their functional status and demonstrates the ability to make significant improvements in function in a reasonable and predictable amount of time.     Precautions / Restrictions Precautions Precautions: Fall Precaution/Restrictions Comments: pt reports 6L O2 at baseline Restrictions Weight Bearing Restrictions Per Provider Order: No      Mobility  Bed Mobility Overal bed mobility: Needs Assistance Bed Mobility: Supine to Sit, Sit to Supine     Supine to sit: Supervision, HOB elevated, Used rails Sit to supine: Supervision, HOB elevated, Used rails   General bed mobility comments: supv for supine<>sit, therapist managing lines for safety    Transfers Overall transfer level: Needs assistance Equipment used: Rolling walker (2 wheels) Transfers: Sit to/from Stand, Bed to chair/wheelchair/BSC Sit to Stand: Contact guard assist   Step pivot transfers: Contact guard assist       General transfer comment: CGA for safety with STS and step pivot to [facility], heavy cues for safety with transfer including hand placement and RW management; pt impulsively removes O2 tubing in standing in attempt to ambulate into restroom without O2 tank needing heavy safety education on using BSC to maintain O2 tubing.    Ambulation/Gait                  Stairs            Wheelchair Mobility     Tilt Bed    Modified Rankin (Stroke Patients Only)       Balance Overall balance assessment: Needs assistance Sitting-balance support: Feet supported Sitting balance-Leahy Scale: Good  Standing balance support: Reliant on assistive device for balance, During functional activity, Bilateral upper extremity supported Standing balance-Leahy Scale: Poor                               Pertinent Vitals/Pain Pain Assessment Pain Assessment: 0-10 Pain Score: 5  Pain Location: head Pain Descriptors  / Indicators: Aching ("just hurts behind my eyes") Pain Intervention(s): Limited activity within patient's tolerance, Monitored during session, Repositioned    Home Living Family/patient expects to be discharged to:: Other (Comment) (extended stay hotel)                   Additional Comments: Pt reports lives on 2nd floor of extended stay hotel, has to climb flight of steps to room but also states she hasn't walked in a year? Pt reports her elder brother moved in with her who fixes her meals. Pt reports she has a family member's RW.    Prior Function Prior Level of Function : Needs assist             Mobility Comments: pt reports nonambulatory for 1 year due to SOB ADLs Comments: pt reports using diapers for bowel/bladder, reports doesn't bathe due to SOB, reports brother complete household chores and groceries are delivered     [facility] Assessment   Upper Extremity Assessment Upper Extremity Assessment: Defer to OT evaluation    Lower Extremity Assessment Lower Extremity Assessment: Overall WFL for tasks assessed    Cervical / Trunk Assessment Cervical / Trunk Assessment: Normal  Communication   Communication Communication: No apparent difficulties    Cognition Arousal: Alert     PT - Cognitive impairments: Difficult to assess                       PT - Cognition Comments: pt a&o x4. Pt initially restless and somewhat impulsive, requesting multiple things (drink, assistance with food stamps, restroom) heavy cues for safety with mobility. Once back in bed, pt very calm, reclines to supine with eyes closed, answers all questions with eyes closed.         Cueing       General Comments General comments (skin integrity, edema, etc.): Pt desats to 85% on 4L with transfer and improves to 88 with pursed lip breathing, return 5L and SpO2 91%. pt left on 5L with Spo2 91%- RN notified.    Exercises     Assessment/Plan    PT Assessment Patient  needs continued PT services  PT Problem List Decreased activity tolerance;Decreased balance;Decreased cognition;Decreased knowledge of use of DME;Decreased safety awareness;Decreased knowledge of precautions;Cardiopulmonary status limiting activity       PT Treatment Interventions DME instruction;Gait training;Stair training;Functional mobility training;Therapeutic activities;Therapeutic exercise;Balance training;Cognitive remediation;Patient/family education    PT Goals (Current goals can be found in the Care Plan section)  Acute Rehab PT Goals Patient Stated Goal: "can someone help me get foodstamps renewed?" PT Goal Formulation: With patient Time For Goal Achievement: 04/12/24 Potential to Achieve Goals: Good    Frequency Min 3X/week     Co-evaluation               AM-PAC PT "6 Clicks" Mobility  Outcome Measure Help needed turning from your back to your side while in a flat bed without using bedrails?: None Help needed moving from lying on your back to sitting on the side of a flat bed without using bedrails?: None Help  needed moving to and from a bed to a chair (including a wheelchair)?: A Little Help needed standing up from a chair using your arms (e.g., wheelchair or bedside chair)?: A Little Help needed to walk in hospital room?: A Little Help needed climbing 3-5 steps with a railing? : A Lot 6 Click Score: 19    End of Session Equipment Utilized During Treatment: Gait belt Activity Tolerance: Patient tolerated treatment well;Patient limited by fatigue Patient left: in bed;with call bell/phone within reach;with bed alarm set Nurse Communication: Mobility status;Other (comment) (SpO2/O2) PT Visit Diagnosis: Unsteadiness on feet (R26.81);Other abnormalities of gait and mobility (R26.89);Muscle weakness (generalized) (M62.81)    Time: 1324-4010 PT Time Calculation (min) (ACUTE ONLY): 26 min   Charges:   PT Evaluation $PT Eval Low Complexity: 1 Low PT  Treatments $Therapeutic Activity: 8-22 mins PT General Charges $$ ACUTE PT VISIT: 1 Visit         Tori Julianah Marciel PT, DPT 03/29/24, 3:49 PM

## 2024-03-30 DIAGNOSIS — J441 Chronic obstructive pulmonary disease with (acute) exacerbation: Secondary | ICD-10-CM | POA: Diagnosis not present

## 2024-03-30 MED ORDER — PANTOPRAZOLE SODIUM 40 MG PO TBEC
40.0000 mg | DELAYED_RELEASE_TABLET | Freq: Every day | ORAL | Status: DC
  Administered 2024-03-30 – 2024-03-31 (×2): 40 mg via ORAL
  Filled 2024-03-30 (×2): qty 1

## 2024-03-30 MED ORDER — GABAPENTIN 300 MG PO CAPS
300.0000 mg | ORAL_CAPSULE | Freq: Three times a day (TID) | ORAL | Status: DC
  Administered 2024-03-30 – 2024-03-31 (×3): 300 mg via ORAL
  Filled 2024-03-30 (×3): qty 1

## 2024-03-30 MED ORDER — ALUM & MAG HYDROXIDE-SIMETH 200-200-20 MG/5ML PO SUSP
30.0000 mL | ORAL | Status: DC | PRN
  Administered 2024-03-30: 30 mL via ORAL
  Filled 2024-03-30: qty 30

## 2024-03-30 NOTE — Evaluation (Signed)
 Occupational Therapy Evaluation Patient Details Name: Linda Foley MRN: [id number] DOB: [birth year] Today's Date: 03/30/2024   History of Present Illness   Linda Foley is a 58 yr old female admitted with shortness of breath and found to have COPD exacerbation. PMH: COPD, polysubstance use     Clinical Impressions Pt is currently presenting with the below listed deficits (see OT problem list). As such, her ADL performance, participation in meaningful activities, and overall functional endurance is compromised. She reports use of 4-6L O2 around the clock at her baseline. Due to decreased activity tolerance and shortness of breath, she has been very limited from a functional stand-point over the past ~1 yr. Today, she was noted to be on 4L O2 via nasal cannula, with her O2 saturation noted to be 87% with light activity on 4L O2. She required education on pursed lip breathing, introductory energy conservation strategies, use of an pulse oximeter to monitor her O2 saturation, and activity pacing. Further education & reinforcement on the aforementioned is recommended. OT will continue to follow her for services in the hospital setting. OT recommends she receive HH OT, a bedside commode, shower chair, and pulse oximeter.      If plan is discharge home, recommend the following:   Assist for transportation;Help with stairs or ramp for entrance;Assistance with cooking/housework     Functional Status Assessment   Patient has had a recent decline in their functional status and demonstrates the ability to make significant improvements in function in a reasonable and predictable amount of time.     Equipment Recommendations   BSC/3in1;Other (comment);Tub/shower seat (Pulse Oximeter to monitor O2 saturation)     Recommendations for Other Services         Precautions/Restrictions   Restrictions Weight Bearing Restrictions Per Provider Order: No Other Position/Activity Restrictions:  O2 dependent at baseline. Uses 4-6L     Mobility Bed Mobility Overal bed mobility: Needs Assistance Bed Mobility: Supine to Sit, Sit to Supine     Supine to sit: Supervision, HOB elevated, Used rails Sit to supine: Supervision, HOB elevated, Used rails        Transfers Overall transfer level: Needs assistance Equipment used: None Transfers: Sit to/from Stand Sit to Stand: Contact guard assist                  Balance     Sitting balance-Leahy Scale: Good       Standing balance-Leahy Scale: Fair              ADL either performed or assessed with clinical judgement   ADL Overall ADL's : Needs assistance/impaired;Independent Eating/Feeding: Sitting   Grooming: Set up;Sitting;Supervision/safety Grooming Details (indicate cue type and reason): simulated seated EOB         Upper Body Dressing : Set up;Supervision/safety;Sitting Upper Body Dressing Details (indicate cue type and reason): seated EOB, based on clinical judgement Lower Body Dressing: Contact guard assist;Sit to/from stand             Vision   Additional Comments: She correctly read the time depicted on the wall clock.            Pertinent Vitals/Pain Pain Assessment Pain Assessment: 0-10 Pain Score: 7  Pain Location: B LE Pain Intervention(s): Limited activity within patient's tolerance, Monitored during session, Repositioned     Extremity/Trunk Assessment Upper Extremity Assessment Upper Extremity Assessment: Overall WFL for tasks assessed;Right hand dominant   Lower Extremity Assessment Lower Extremity Assessment: Overall WFL for tasks assessed  Communication Communication Communication: No apparent difficulties   Cognition Arousal: Alert Behavior During Therapy: WFL for tasks assessed/performed               OT - Cognition Comments: Oriented x4, able to follow commands                 Following commands: Intact                  Home  Living Family/patient expects to be discharged to:: Other (Comment) ([employer] Stay Motel)          Additional Comments: Pt reports lives on 2nd floor of extended stay hotel, has to climb flight of steps to room. Pt reports her elder brother moved in with her who fixes her meals.       Prior Functioning/Environment Prior Level of Function : Needs assist             Mobility Comments: She reported being mostly bedridden for 1 year, due to decreased endurance and SOB. ADLs Comments: She reported being very limited by decreased endurance/SOB. She used briefs for toileting management, and did minimal dressing and bathing. She mostly ordered food for meals.    OT Problem List: Cardiopulmonary status limiting activity;Decreased strength;Decreased activity tolerance;Impaired balance (sitting and/or standing);Decreased knowledge of use of DME or AE;Decreased safety awareness;Pain   OT Treatment/Interventions: Self-care/ADL training;Therapeutic exercise;Energy conservation;DME and/or AE instruction;Patient/family education;Therapeutic activities;Balance training      OT Goals(Current goals can be found in the care plan section)   Acute Rehab OT Goals Patient Stated Goal: improved quality of life and to be able to tolerate more activity OT Goal Formulation: With patient Time For Goal Achievement: 04/13/24 Potential to Achieve Goals: Good ADL Goals Pt Will Transfer to Toilet: with supervision;ambulating;grab bars Pt Will Perform Toileting - Clothing Manipulation and hygiene: with supervision;sit to/from stand Additional ADL Goal #1: Pt will independently verbalize and/or implement at least 3 energy conservation strategies during self-care tasks, to facilitate improved overall activity tolerance.   OT Frequency:  Min 2X/week       AM-PAC OT "6 Clicks" Daily Activity     Outcome Measure Help from another person eating meals?: None Help from another person taking care of personal  grooming?: A Little Help from another person toileting, which includes using toliet, bedpan, or urinal?: A Little Help from another person bathing (including washing, rinsing, drying)?: A Lot Help from another person to put on and taking off regular upper body clothing?: A Little Help from another person to put on and taking off regular lower body clothing?: A Little 6 Click Score: 18   End of Session Equipment Utilized During Treatment: Oxygen Nurse Communication: Other (comment) (nurse tech informed of pt requesting to be washed off)  Activity Tolerance: Other (comment) (Limited by compromised endurance and SOB) Patient left: in bed;with call bell/phone within reach  OT Visit Diagnosis: Muscle weakness (generalized) (M62.81);Unsteadiness on feet (R26.81)                Time: 1455-1530 OT Time Calculation (min): 35 min Charges:  OT General Charges $OT Visit: 1 Visit OT Evaluation $OT Eval Moderate Complexity: 1 Mod OT Treatments $Therapeutic Activity: 8-22 mins    Reuben Likes, OTR/L 03/30/2024, 5:15 PM

## 2024-03-30 NOTE — Progress Notes (Signed)
 PROGRESS NOTE    Linda Foley  ZOX:[id number] DOB: 01/29/1966 DOA: 03/28/2024 PCP: Patient, No Pcp Per    Brief Narrative:   Linda Foley is a 58 y.o. female with past medical history significant for COPD/chronic hypoxic respiratory failure on 5 L [place] at baseline (reports recently ran out of her albuterol inhaler/nebulizer solution) who presented to [facility] ED on 03/28/2024 from home via EMS with shortness of breath.  Patient reports developed cough, nasal congestion over the last several days.  On EMS arrival, patient was noted to be hypoxic with wheezing with SpO2 in the 80s. Patient was given albuterol, Atrovent, 2 g IV magnesium, 125 mg IV Solu-Medrol by EMS.  And transported to the ED.   In the ED, [place] 7.5 F, HR 80, RR 18, BP 123/83, SpO2 91% on 6 L nasal cannula.  WBC 8.1, hemoglobin 16.3, platelet count 190.  Sodium 138, potassium 4.0, chloride 102, CO2 30, glucose 126, BUN 13, creatinine 0.54.  AST 32, ALT 28, total bilirubin 0.6.  High sensitive troponin 3> 2.  COVID/influenza/RSV PCR negative.  Chest x-ray with stable chest, no active disease, noted hyperinflation with no consolidation or effusion.  Patient received DuoNeb treatments x 2.  TRH consulted for admission for further evaluation management of acute on chronic hypoxic respiratory failure/COPD exacerbation.  Assessment & Plan:   Acute on chronic hypoxic respiratory failure, POA COPD exacerbation Patient [name] with progressive shortness of breath, cough and nasal congestion.  Was noted to be hypoxic with SpO2 in the 80s per EMS.  Complicated by patient running out of her home albuterol inhaler/nebulized medicine. -- Azithromycin 500 mg p.o. daily -- Brovana neb twice daily -- Pulmicort neb twice daily -- DuoNeb scheduled BID -- Prednisone 50 mg p.o. daily -- Albuterol neb every 4 hours as needed wheezing/shortness of breath  -- Continue supplemental oxygen, maintain SpO2 greater than 88%, on 5 L  nasal cannula at baseline  Tobacco use disorder Counseled on need for complete cessation/abstinence. -- Nicotine patch   DVT prophylaxis: enoxaparin (LOVENOX) injection 40 mg Start: 03/28/24 2200 SCDs Start: 03/28/24 1629    Code Status: Full Code Family Communication: No family present at bedside  Disposition Plan:  Level of care: Med-Surg Status is: Inpatient Remains inpatient appropriate because: Scheduled neb treatments    Consultants:  None  Procedures:  None  Antimicrobials:  Azithromycin 4/6>>   Subjective: Patient seen examined bedside, lying in bed.  Patient reports dyspnea improved, on 4 L nasal cannula which is below her typical baseline 5 L.  Continues with cramps/pain to her legs.  Patient reports needs refills of her home albuterol.  No other specific questions, concerns or complaints at this time.  Denies headache, no dizziness, no chest pain, no palpitations, no abdominal pain, no fever/chills/night sweats, no nausea/vomiting/diarrhea, no focal weakness, no fatigue, no paresthesia.  No acute events overnight per nurse staff.  Objective: Vitals:   03/29/24 2037 03/30/24 0602 03/30/24 0759 03/30/24 0811  BP: 114/67 118/71    Pulse: 91 75    Resp: 14 14    Temp: 98.3 F (36.8 C) 98 F (36.7 C)    TempSrc:  Oral    SpO2: 95% 97% 92% 95%  Weight:      Height:        Intake/Output Summary (Last 24 hours) at 03/30/2024 1234 Last data filed at 03/30/2024 0600 Gross per 24 hour  Intake 400 ml  Output --  Net 400 ml  Filed Weights   03/28/24 1842  Weight: 47.1 kg    Examination:  Physical Exam: GEN: NAD, alert and oriented x 3, chronically ill/thin in appearance, appears older than stated age HEENT: NCAT, PERRL, EOMI, sclera clear, MMM, poor dentition PULM: Breath sounds diminished bilateral bases with wheezing throughout all lung fields, normal respiratory effort without accessory muscle use, on 4 L nasal cannula (5L baseline) CV: RRR w/o  M/G/R GI: abd soft, NTND, + BS MSK: no peripheral edema, moves all extremities independently NEURO: CN II-XII intact, no focal deficits, sensation to light touch intact PSYCH: normal mood/affect Integumentary: No concerning rashes/lesions/wounds nonossified skin surfaces    Data Reviewed: I have personally reviewed following labs and imaging studies  CBC: Recent Labs  Lab 03/28/24 1422  WBC 8.1  NEUTROABS 4.8  HGB 16.3*  HCT 52.8*  MCV 97.4  PLT 190   Basic Metabolic Panel: Recent Labs  Lab 03/28/24 1422  NA 138  K 4.0  CL 102  CO2 30  GLUCOSE 126*  BUN 13  CREATININE 0.54  CALCIUM 8.4*   GFR: Estimated Creatinine Clearance: 58.4 mL/min (by C-G formula based on SCr of 0.54 mg/dL). Liver Function Tests: Recent Labs  Lab 03/28/24 1422  AST 32  ALT 28  ALKPHOS 44  BILITOT 0.6  PROT 7.5  ALBUMIN 3.4*   No results for input(s): "LIPASE", "AMYLASE" in the last 168 hours. No results for input(s): "AMMONIA" in the last 168 hours. Coagulation Profile: No results for input(s): "INR", "PROTIME" in the last 168 hours. Cardiac Enzymes: No results for input(s): "CKTOTAL", "CKMB", "CKMBINDEX", "TROPONINI" in the last 168 hours. BNP (last 3 results) No results for input(s): "PROBNP" in the last 8760 hours. HbA1C: No results for input(s): "HGBA1C" in the last 72 hours. CBG: No results for input(s): "GLUCAP" in the last 168 hours. Lipid Profile: No results for input(s): "CHOL", "HDL", "LDLCALC", "TRIG", "CHOLHDL", "LDLDIRECT" in the last 72 hours. Thyroid Function Tests: No results for input(s): "TSH", "T4TOTAL", "FREET4", "T3FREE", "THYROIDAB" in the last 72 hours. Anemia Panel: No results for input(s): "VITAMINB12", "FOLATE", "FERRITIN", "TIBC", "IRON", "RETICCTPCT" in the last 72 hours. Sepsis Labs: No results for input(s): "PROCALCITON", "LATICACIDVEN" in the last 168 hours.  Recent Results (from the past 240 hours)  Resp panel by RT-PCR (RSV, Flu A&B, Covid)  Anterior Nasal Swab     Status: None   Collection Time: 03/28/24  2:22 PM   Specimen: Anterior Nasal Swab  Result Value Ref Range Status   SARS Coronavirus 2 by RT PCR NEGATIVE NEGATIVE Final    Comment: (NOTE) SARS-CoV-2 target nucleic acids are NOT DETECTED.  The SARS-CoV-2 RNA is generally detectable in upper respiratory specimens during the acute phase of infection. The lowest concentration of SARS-CoV-2 viral copies this assay can detect is 138 copies/mL. A negative result does not preclude SARS-Cov-2 infection and should not be used as the sole basis for treatment or other patient management decisions. A negative result may occur with  improper specimen collection/handling, submission of specimen other than nasopharyngeal swab, presence of viral mutation(s) within the areas targeted by this assay, and inadequate number of viral copies(<138 copies/mL). A negative result must be combined with clinical observations, patient history, and epidemiological information. The expected result is Negative.  Fact Sheet for Patients:  BloggerCourse.com  Fact Sheet for Healthcare Providers:  SeriousBroker.it  This test is no t yet approved or cleared by the [place] FDA and  has been authorized for detection and/or diagnosis of SARS-CoV-2 by  FDA under an Emergency Use Authorization (EUA). This EUA will remain  in effect (meaning this test can be used) for the duration of the COVID-19 declaration under Section 564(b)(1) of the Act, 21 U.S.C.section 360bbb-3(b)(1), unless the authorization is terminated  or revoked sooner.       Influenza A by PCR NEGATIVE NEGATIVE Final   Influenza B by PCR NEGATIVE NEGATIVE Final    Comment: (NOTE) The Xpert Xpress SARS-CoV-2/FLU/RSV plus assay is intended as an aid in the diagnosis of influenza from Nasopharyngeal swab specimens and should not be used as a sole basis for treatment. Nasal washings  and aspirates are unacceptable for Xpert Xpress SARS-CoV-2/FLU/RSV testing.  Fact Sheet for Patients: BloggerCourse.com  Fact Sheet for Healthcare Providers: SeriousBroker.it  This test is not yet approved or cleared by the [place] FDA and has been authorized for detection and/or diagnosis of SARS-CoV-2 by FDA under an Emergency Use Authorization (EUA). This EUA will remain in effect (meaning this test can be used) for the duration of the COVID-19 declaration under Section 564(b)(1) of the Act, 21 U.S.C. section 360bbb-3(b)(1), unless the authorization is terminated or revoked.     Resp Syncytial Virus by PCR NEGATIVE NEGATIVE Final    Comment: (NOTE) Fact Sheet for Patients: BloggerCourse.com  Fact Sheet for Healthcare Providers: SeriousBroker.it  This test is not yet approved or cleared by the [place] FDA and has been authorized for detection and/or diagnosis of SARS-CoV-2 by FDA under an Emergency Use Authorization (EUA). This EUA will remain in effect (meaning this test can be used) for the duration of the COVID-19 declaration under Section 564(b)(1) of the Act, 21 U.S.C. section 360bbb-3(b)(1), unless the authorization is terminated or revoked.  Performed at [facility], [address] [place], [place] [address]   MRSA Next Gen by PCR, Nasal     Status: Abnormal   Collection Time: 03/29/24  2:34 AM   Specimen: Nasal Mucosa; Nasal Swab  Result Value Ref Range Status   MRSA by PCR Next Gen DETECTED (A) NOT DETECTED Final    Comment: CRITICAL RESULT CALLED TO, READ BACK BY AND VERIFIED WITH: HILDA ANG, RN @ [phone] 03/29/24 MH (NOTE) The GeneXpert MRSA Assay (FDA approved for NASAL specimens only), is one component of a comprehensive MRSA colonization surveillance program. It is not intended to diagnose MRSA infection nor to guide or monitor  treatment for MRSA infections. Test performance is not FDA approved in patients less than 74 years old. Performed at [facility], [address], [place], [place] [address]          Radiology Studies: [facility] Chest Port 1 View Result Date: 03/28/2024 CLINICAL DATA:  Worsening shortness of breath. EXAM: PORTABLE CHEST 1 VIEW COMPARISON:  06/12/2023. FINDINGS: The heart size and mediastinal contours are hyperinflation of the lungs is noted. No consolidation, effusion, or pneumothorax is seen. The bony structures appear stable. IMPRESSION: A stable chest with no active disease. Electronically Signed   By: Thornell Sartorius M.D.   On: 03/28/2024 14:42        Scheduled Meds:  arformoterol  15 mcg Nebulization BID   azithromycin  500 mg Oral Daily   budesonide (PULMICORT) nebulizer solution  0.5 mg Nebulization BID   Chlorhexidine Gluconate Cloth  6 each Topical Daily   enoxaparin (LOVENOX) injection  40 mg Subcutaneous Q24H   gabapentin  300 mg Oral TID   ipratropium-albuterol  3 mL Nebulization BID   mupirocin ointment  1 Application  Nasal BID   nicotine  7 mg Transdermal Daily   pantoprazole  40 mg Oral Daily   predniSONE  50 mg Oral Q breakfast   Continuous Infusions:   LOS: 2 days    Time spent: 52 minutes spent on 03/30/2024 caring for this patient face-to-face including chart review, ordering labs/tests, documenting, discussion with nursing staff, consultants, updating family and interview/physical exam    Alvira Philips [place], DO [facility] Available via Epic secure chat 7am-7pm After these hours, please refer to coverage provider listed on [email or web address] 03/30/2024, 12:34 PM

## 2024-03-31 ENCOUNTER — Other Ambulatory Visit (HOSPITAL_COMMUNITY): Payer: Self-pay

## 2024-03-31 DIAGNOSIS — J441 Chronic obstructive pulmonary disease with (acute) exacerbation: Secondary | ICD-10-CM | POA: Diagnosis not present

## 2024-03-31 MED ORDER — PREDNISONE 10 MG PO TABS
ORAL_TABLET | ORAL | 0 refills | Status: AC
  Filled 2024-03-31: qty 30, 12d supply, fill #0

## 2024-03-31 MED ORDER — ALUM & MAG HYDROXIDE-SIMETH 200-200-20 MG/5ML PO SUSP
30.0000 mL | ORAL | 0 refills | Status: AC | PRN
  Filled 2024-03-31: qty 355, 2d supply, fill #0

## 2024-03-31 MED ORDER — METHOCARBAMOL 500 MG PO TABS
500.0000 mg | ORAL_TABLET | Freq: Four times a day (QID) | ORAL | 0 refills | Status: AC | PRN
  Filled 2024-03-31: qty 60, 15d supply, fill #0

## 2024-03-31 MED ORDER — MOMETASONE FURO-FORMOTEROL FUM 200-5 MCG/ACT IN AERO
2.0000 | INHALATION_SPRAY | Freq: Two times a day (BID) | RESPIRATORY_TRACT | 9 refills | Status: AC
  Filled 2024-03-31 – 2024-04-25 (×2): qty 13, 30d supply, fill #0

## 2024-03-31 MED ORDER — ALBUTEROL SULFATE (2.5 MG/3ML) 0.083% IN NEBU
2.5000 mg | INHALATION_SOLUTION | Freq: Four times a day (QID) | RESPIRATORY_TRACT | 12 refills | Status: AC | PRN
Start: 2024-03-31 — End: 2024-06-29
  Filled 2024-03-31: qty 75, 7d supply, fill #0

## 2024-03-31 MED ORDER — MONTELUKAST SODIUM 10 MG PO TABS
10.0000 mg | ORAL_TABLET | Freq: Every day | ORAL | 0 refills | Status: AC
  Filled 2024-03-31: qty 30, 30d supply, fill #0
  Filled 2024-04-25: qty 30, 30d supply, fill #1

## 2024-03-31 MED ORDER — GABAPENTIN 300 MG PO CAPS
300.0000 mg | ORAL_CAPSULE | Freq: Three times a day (TID) | ORAL | 0 refills | Status: AC
  Filled 2024-03-31: qty 270, 90d supply, fill #0

## 2024-03-31 MED ORDER — PANTOPRAZOLE SODIUM 40 MG PO TBEC
40.0000 mg | DELAYED_RELEASE_TABLET | Freq: Every day | ORAL | 0 refills | Status: AC
  Filled 2024-03-31: qty 30, 30d supply, fill #0
  Filled 2024-04-25: qty 30, 30d supply, fill #1

## 2024-03-31 MED ORDER — OXYCODONE HCL 5 MG PO TABS
5.0000 mg | ORAL_TABLET | Freq: Four times a day (QID) | ORAL | 0 refills | Status: AC | PRN
  Filled 2024-03-31: qty 20, 5d supply, fill #0

## 2024-03-31 MED ORDER — ALBUTEROL SULFATE HFA 108 (90 BASE) MCG/ACT IN AERS
2.0000 | INHALATION_SPRAY | Freq: Four times a day (QID) | RESPIRATORY_TRACT | 3 refills | Status: AC | PRN
  Filled 2024-03-31: qty 6.7, 25d supply, fill #0

## 2024-03-31 MED ORDER — AZITHROMYCIN 500 MG PO TABS
500.0000 mg | ORAL_TABLET | Freq: Every day | ORAL | 0 refills | Status: AC
  Filled 2024-03-31: qty 2, 2d supply, fill #0

## 2024-03-31 NOTE — Discharge Summary (Signed)
 Physician Discharge Summary  Linda Foley NWG:[id number] DOB: 1966/03/26 DOA: 03/28/2024  PCP: Linda, No Pcp Per  Admit date: 03/28/2024 Discharge date: 03/31/2024  Admitted From: Home Disposition: Home  Recommendations for Outpatient Follow-up:  Follow up with PCP in 1-2 weeks Outpatient referral to pulmonology for chronic management of her COPD Started on Dulera inhaler, montelukast for optimize control of her underlying COPD Continue prednisone taper on discharge for COPD exacerbation Continue azithromycin to complete 5-day antibiotic course Continue to encourage tobacco cessation Repeat hold home albuterol MDI/albuterol neb solution  Home Health: No Equipment/Devices: Remains on home O2 4-5 L per nasal cannula  Discharge Condition: Stable CODE STATUS: Full code Diet recommendation: Regular diet  History of present illness:   Linda Foley is a 58 y.o. female with past medical history significant for COPD/chronic hypoxic respiratory failure on 5 L [place] at baseline (reports recently ran out of her albuterol inhaler/nebulizer solution) who presented to [facility] ED on 03/28/2024 from home via EMS with shortness of breath.  Linda reports developed cough, nasal congestion over the last several days.  On EMS arrival, Linda was noted to be hypoxic with wheezing with SpO2 in the 80s. Linda was given albuterol, Atrovent, 2 g IV magnesium, 125 mg IV Solu-Medrol by EMS.  And transported to the ED.    In the ED, Trinate 7.5 F, HR 80, RR 18, BP 123/83, SpO2 91% on 6 L nasal cannula.  WBC 8.1, hemoglobin 16.3, platelet count 190.  Sodium 138, potassium 4.0, chloride 102, CO2 30, glucose 126, BUN 13, creatinine 0.54.  AST 32, ALT 28, total bilirubin 0.6.  High sensitive troponin 3> 2.  COVID/influenza/RSV PCR negative.  Chest x-ray with stable chest, no active disease, noted hyperinflation with no consolidation or effusion.  Linda received DuoNeb treatments x 2.  TRH consulted  for admission for further evaluation management of acute on chronic hypoxic respiratory failure/COPD exacerbation.  Hospital course:  Acute on chronic hypoxic respiratory failure, POA COPD exacerbation Linda Foley ED with progressive shortness of breath, cough and nasal congestion.  Was noted to be hypoxic with SpO2 in the 80s per EMS.  Complicated by Linda running out of her home albuterol inhaler/nebulized medicine.  Linda was started on scheduled neb treatments with Brovana, Pulmicort, DuoNebs, azithromycin and IV followed by oral steroids.  Linda's symptoms improved and Linda was titrated down on her supplemental oxygen down to 4 L nasal cannula with SpO2 97% at rest at time of discharge.  Will start on Dulera inhaler and montelukast for chronic control of her underlying COPD.  Will continue azithromycin to complete 5-day course, prednisone taper.  Refilled her home albuterol neb solution and albuterol MDI.  Outpatient follow-up with PCP.  Outpatient referral to pulmonology for chronic management of her COPD.   Tobacco use disorder Counseled on need for complete cessation/abstinence.  Discharge Diagnoses:  Principal Problem:   COPD exacerbation [facility])    Discharge Instructions  Discharge Instructions     Call MD for:  difficulty breathing, headache or visual disturbances   Complete by: As directed    Call MD for:  extreme fatigue   Complete by: As directed    Call MD for:  persistant dizziness or light-headedness   Complete by: As directed    Call MD for:  persistant nausea and vomiting   Complete by: As directed    Call MD for:  severe uncontrolled pain   Complete by: As directed    Call MD for:  temperature >100.4   Complete by: As directed    Diet - low sodium heart healthy   Complete by: As directed    Increase activity slowly   Complete by: As directed    Pulmonary Visit   Complete by: As directed    Chronic management of COPD   Reason for referral: Other  Pulmonary      Allergies as of 03/31/2024       Reactions   Penicillins Nausea And Vomiting   Did it involve swelling of the face/tongue/throat, SOB, or low BP? No Did it involve sudden or severe rash/hives, skin peeling, or any reaction on the inside of your mouth or nose? No Did you need to seek medical attention at a hospital or doctor's office? No When did it last happen?      Several Years Ago If all above answers are "NO", may proceed with cephalosporin use.        Medication List     TAKE these medications    albuterol (2.5 MG/3ML) 0.083% nebulizer solution Commonly known as: PROVENTIL Take 3 mLs (2.5 mg total) by nebulization every 6 (six) hours as needed for wheezing or shortness of breath.   albuterol 108 (90 Base) MCG/ACT inhaler Commonly known as: VENTOLIN HFA Inhale 2 puffs into the lungs every 6 (six) hours as needed for wheezing or shortness of breath.   alum & mag hydroxide-simeth 200-200-20 MG/5ML suspension Commonly known as: MAALOX/MYLANTA Take 30 mLs by mouth every 4 (four) hours as needed for indigestion or heartburn.   aspirin EC 81 MG tablet Take 81 mg by mouth daily as needed for mild pain (pain score 1-3) or moderate pain (pain score 4-6). Swallow whole.   azithromycin 500 MG tablet Commonly known as: ZITHROMAX Take 1 tablet (500 mg total) by mouth daily for 2 days.   gabapentin 300 MG capsule Commonly known as: NEURONTIN Take 1 capsule (300 mg total) by mouth 3 (three) times daily.   methocarbamol 500 MG tablet Commonly known as: ROBAXIN Take 1 tablet (500 mg total) by mouth every 6 (six) hours as needed for muscle spasms.   mometasone-formoterol 200-5 MCG/ACT Aero Commonly known as: DULERA Inhale 2 puffs into the lungs 2 (two) times daily.   montelukast 10 MG tablet Commonly known as: SINGULAIR Take 1 tablet (10 mg total) by mouth daily.   oxyCODONE 5 MG immediate release tablet Commonly known as: Oxy IR/ROXICODONE Take 1 tablet (5  mg total) by mouth every 6 (six) hours as needed for moderate pain (pain score 4-6).   pantoprazole 40 MG tablet Commonly known as: PROTONIX Take 1 tablet (40 mg total) by mouth daily.   predniSONE 10 MG tablet Commonly known as: DELTASONE Take 4 tablets (40 mg total) by mouth daily for 3 days, THEN 3 tablets (30 mg total) daily for 3 days, THEN 2 tablets (20 mg total) daily for 3 days, THEN 1 tablet (10 mg total) daily for 3 days. Start taking on: April 01, 2024        Follow-up Information     [facility] [employer]. Call.   Why: Call to get set up with Medicaid transportation. Contact information: [address] [place], [place] [address] [phone]        [employer]. Call.   Contact information: [address] [place], [place] [address] [phone]        [employer] researcher, broadcasting/film/video. Call.   Contact information: [employer] [address]  [place], [place] [address] [phone]        [employer]. Call.   Contact information: [address] [place], [place] [address] [phone]        [employer]. Call.   Contact information: [address] [place], [place] [address] [phone]        [facility]. Call.   Contact information: [address] [place], [place] [address] [phone]               Allergies  Allergen Reactions   Penicillins Nausea And Vomiting    Did it involve swelling of the face/tongue/throat, SOB, or low BP? No Did it involve sudden or severe rash/hives, skin peeling, or any reaction on the inside of your mouth or nose? No Did you need to seek medical attention at a hospital or doctor's office? No When did it last happen?      Several Years Ago If all above answers are "NO", may proceed with cephalosporin use.      Consultations: None   Procedures/Studies: DG Chest Port 1  View Result Date: 03/28/2024 CLINICAL DATA:  Worsening shortness of breath. EXAM: PORTABLE CHEST 1 VIEW COMPARISON:  06/12/2023. FINDINGS: The heart size and mediastinal contours are hyperinflation of the lungs is noted. No consolidation, effusion, or pneumothorax is seen. The bony structures appear stable. IMPRESSION: A stable chest with no active disease. Electronically Signed   By: Thornell Sartorius M.D.   On: 03/28/2024 14:42     Subjective: Linda seen examined bedside, lying in bed.  Watching TV.  Remains on 4 L nasal cannula which is below her typical baseline of 5 L.  Breathing much improved.  Discussed with Linda starting on chronic controlling inhalers with Dulera and montelukast.  Requesting refills of her home albuterol.  No other questions or concerns at this time.  Ready for discharge home.  Denies headache, no dizziness, no chest pain, no palpitations, no shortness of breath more than her typical baseline, no fever/chills/night sweats, no nausea/vomiting/diarrhea, no focal weakness, no fatigue, no paresthesias.  No acute events overnight per nursing staff.  Discharge Exam: Vitals:   03/31/24 0806 03/31/24 0810  BP:    Pulse:    Resp:    Temp:    SpO2: 96% 98%   Vitals:   03/30/24 2119 03/31/24 0428 03/31/24 0806 03/31/24 0810  BP: 109/62 104/82    Pulse: 89 82    Resp: 18 20    Temp: 98.5 F (36.9 C) 98.7 F (37.1 C)    TempSrc: Oral Oral    SpO2: 97% 95% 96% 98%  Weight:      Height:        Physical Exam: GEN: NAD, alert and oriented x 3, chronically ill/thin in appearance, appears older than stated age HEENT: NCAT, PERRL, EOMI, sclera clear, MMM, poor dentition PULM: Breath sounds diminished bilateral bases with wheezing throughout all lung fields, normal respiratory effort without accessory muscle use, on 4 L nasal cannula (5L baseline) CV: RRR w/o M/G/R GI: abd soft, NTND, + BS MSK: no peripheral edema, moves all extremities independently NEURO: CN II-XII  intact, no focal deficits, sensation to light touch intact PSYCH: normal mood/affect Integumentary: No concerning rashes/lesions/wounds nonossified skin surfaces    The results of significant diagnostics from this hospitalization (including imaging, microbiology, ancillary and laboratory) are listed below for reference.     Microbiology: Recent Results (from the past  240 hours)  Resp panel by RT-PCR (RSV, Flu A&B, Covid) Anterior Nasal Swab     Status: None   Collection Time: 03/28/24  2:22 PM   Specimen: Anterior Nasal Swab  Result Value Ref Range Status   SARS Coronavirus 2 by RT PCR NEGATIVE NEGATIVE Final    Comment: (NOTE) SARS-CoV-2 target nucleic acids are NOT DETECTED.  The SARS-CoV-2 RNA is generally detectable in upper respiratory specimens during the acute phase of infection. The lowest concentration of SARS-CoV-2 viral copies this assay can detect is 138 copies/mL. A negative result does not preclude SARS-Cov-2 infection and should not be used as the sole basis for treatment or other Linda management decisions. A negative result may occur with  improper specimen collection/handling, submission of specimen other than nasopharyngeal swab, presence of viral mutation(s) within the areas targeted by this assay, and inadequate number of viral copies(<138 copies/mL). A negative result must be combined with clinical observations, Linda history, and epidemiological information. The expected result is Negative.  Fact Sheet for Patients:  BloggerCourse.com  Fact Sheet for Healthcare Providers:  SeriousBroker.it  This test is no t yet approved or cleared by the [place] FDA and  has been authorized for detection and/or diagnosis of SARS-CoV-2 by FDA under an Emergency Use Authorization (EUA). This EUA will remain  in effect (meaning this test can be used) for the duration of the COVID-19 declaration under Section  564(b)(1) of the Act, 21 U.S.C.section 360bbb-3(b)(1), unless the authorization is terminated  or revoked sooner.       Influenza A by PCR NEGATIVE NEGATIVE Final   Influenza B by PCR NEGATIVE NEGATIVE Final    Comment: (NOTE) The Xpert Xpress SARS-CoV-2/FLU/RSV plus assay is intended as an aid in the diagnosis of influenza from Nasopharyngeal swab specimens and should not be used as a sole basis for treatment. Nasal washings and aspirates are unacceptable for Xpert Xpress SARS-CoV-2/FLU/RSV testing.  Fact Sheet for Patients: BloggerCourse.com  Fact Sheet for Healthcare Providers: SeriousBroker.it  This test is not yet approved or cleared by the [place] FDA and has been authorized for detection and/or diagnosis of SARS-CoV-2 by FDA under an Emergency Use Authorization (EUA). This EUA will remain in effect (meaning this test can be used) for the duration of the COVID-19 declaration under Section 564(b)(1) of the Act, 21 U.S.C. section 360bbb-3(b)(1), unless the authorization is terminated or revoked.     Resp Syncytial Virus by PCR NEGATIVE NEGATIVE Final    Comment: (NOTE) Fact Sheet for Patients: BloggerCourse.com  Fact Sheet for Healthcare Providers: SeriousBroker.it  This test is not yet approved or cleared by the [place] FDA and has been authorized for detection and/or diagnosis of SARS-CoV-2 by FDA under an Emergency Use Authorization (EUA). This EUA will remain in effect (meaning this test can be used) for the duration of the COVID-19 declaration under Section 564(b)(1) of the Act, 21 U.S.C. section 360bbb-3(b)(1), unless the authorization is terminated or revoked.  Performed at [facility], [address] [place], [place] [address]   MRSA Next Gen by PCR, Nasal     Status: Abnormal   Collection Time: 03/29/24  2:34 AM   Specimen:  Nasal Mucosa; Nasal Swab  Result Value Ref Range Status   MRSA by PCR Next Gen DETECTED (A) NOT DETECTED Final    Comment: CRITICAL RESULT CALLED TO, READ BACK BY AND VERIFIED WITH: HILDA ANG, RN @ [phone] 03/29/24 MH (NOTE) The GeneXpert MRSA Assay (FDA approved for NASAL specimens  only), is one component of a comprehensive MRSA colonization surveillance program. It is not intended to diagnose MRSA infection nor to guide or monitor treatment for MRSA infections. Test performance is not FDA approved in patients less than 60 years old. Performed at [facility], [address], [place], [place] [address]      Labs: BNP (last 3 results) No results for input(s): "BNP" in the last 8760 hours. Basic Metabolic Panel: Recent Labs  Lab 03/28/24 1422  NA 138  K 4.0  CL 102  CO2 30  GLUCOSE 126*  BUN 13  CREATININE 0.54  CALCIUM 8.4*   Liver Function Tests: Recent Labs  Lab 03/28/24 1422  AST 32  ALT 28  ALKPHOS 44  BILITOT 0.6  PROT 7.5  ALBUMIN 3.4*   No results for input(s): "LIPASE", "AMYLASE" in the last 168 hours. No results for input(s): "AMMONIA" in the last 168 hours. CBC: Recent Labs  Lab 03/28/24 1422  WBC 8.1  NEUTROABS 4.8  HGB 16.3*  HCT 52.8*  MCV 97.4  PLT 190   Cardiac Enzymes: No results for input(s): "CKTOTAL", "CKMB", "CKMBINDEX", "TROPONINI" in the last 168 hours. BNP: Invalid input(s): "POCBNP" CBG: No results for input(s): "GLUCAP" in the last 168 hours. D-Dimer No results for input(s): "DDIMER" in the last 72 hours. Hgb A1c No results for input(s): "HGBA1C" in the last 72 hours. Lipid Profile No results for input(s): "CHOL", "HDL", "LDLCALC", "TRIG", "CHOLHDL", "LDLDIRECT" in the last 72 hours. Thyroid function studies No results for input(s): "TSH", "T4TOTAL", "T3FREE", "THYROIDAB" in the last 72 hours.  Invalid input(s): "FREET3" Anemia work up No results for input(s): "VITAMINB12", "FOLATE", "FERRITIN", "TIBC",  "IRON", "RETICCTPCT" in the last 72 hours. Urinalysis    Component Value Date/Time   COLORURINE YELLOW 10/15/2016 1254   APPEARANCEUR CLOUDY (A) 10/15/2016 1254   LABSPEC 1.015 10/15/2016 1254   PHURINE 7.0 10/15/2016 1254   GLUCOSEU NEGATIVE 10/15/2016 1254   HGBUR SMALL (A) 10/15/2016 1254   BILIRUBINUR NEGATIVE 10/15/2016 1254   KETONESUR NEGATIVE 10/15/2016 1254   PROTEINUR NEGATIVE 10/15/2016 1254   NITRITE POSITIVE (A) 10/15/2016 1254   LEUKOCYTESUR TRACE (A) 10/15/2016 1254   Sepsis Labs Recent Labs  Lab 03/28/24 1422  WBC 8.1   Microbiology Recent Results (from the past 240 hours)  Resp panel by RT-PCR (RSV, Flu A&B, Covid) Anterior Nasal Swab     Status: None   Collection Time: 03/28/24  2:22 PM   Specimen: Anterior Nasal Swab  Result Value Ref Range Status   SARS Coronavirus 2 by RT PCR NEGATIVE NEGATIVE Final    Comment: (NOTE) SARS-CoV-2 target nucleic acids are NOT DETECTED.  The SARS-CoV-2 RNA is generally detectable in upper respiratory specimens during the acute phase of infection. The lowest concentration of SARS-CoV-2 viral copies this assay can detect is 138 copies/mL. A negative result does not preclude SARS-Cov-2 infection and should not be used as the sole basis for treatment or other Linda management decisions. A negative result may occur with  improper specimen collection/handling, submission of specimen other than nasopharyngeal swab, presence of viral mutation(s) within the areas targeted by this assay, and inadequate number of viral copies(<138 copies/mL). A negative result must be combined with clinical observations, Linda history, and epidemiological information. The expected result is Negative.  Fact Sheet for Patients:  BloggerCourse.com  Fact Sheet for Healthcare Providers:  SeriousBroker.it  This test is no t yet approved or cleared by the [place] FDA and  has been  authorized for detection and/or diagnosis of SARS-CoV-2 by FDA under an Emergency Use Authorization (EUA). This EUA will remain  in effect (meaning this test can be used) for the duration of the COVID-19 declaration under Section 564(b)(1) of the Act, 21 U.S.C.section 360bbb-3(b)(1), unless the authorization is terminated  or revoked sooner.       Influenza A by PCR NEGATIVE NEGATIVE Final   Influenza B by PCR NEGATIVE NEGATIVE Final    Comment: (NOTE) The Xpert Xpress SARS-CoV-2/FLU/RSV plus assay is intended as an aid in the diagnosis of influenza from Nasopharyngeal swab specimens and should not be used as a sole basis for treatment. Nasal washings and aspirates are unacceptable for Xpert Xpress SARS-CoV-2/FLU/RSV testing.  Fact Sheet for Patients: BloggerCourse.com  Fact Sheet for Healthcare Providers: SeriousBroker.it  This test is not yet approved or cleared by the [place] FDA and has been authorized for detection and/or diagnosis of SARS-CoV-2 by FDA under an Emergency Use Authorization (EUA). This EUA will remain in effect (meaning this test can be used) for the duration of the COVID-19 declaration under Section 564(b)(1) of the Act, 21 U.S.C. section 360bbb-3(b)(1), unless the authorization is terminated or revoked.     Resp Syncytial Virus by PCR NEGATIVE NEGATIVE Final    Comment: (NOTE) Fact Sheet for Patients: BloggerCourse.com  Fact Sheet for Healthcare Providers: SeriousBroker.it  This test is not yet approved or cleared by the [place] FDA and has been authorized for detection and/or diagnosis of SARS-CoV-2 by FDA under an Emergency Use Authorization (EUA). This EUA will remain in effect (meaning this test can be used) for the duration of the COVID-19 declaration under Section 564(b)(1) of the Act, 21 U.S.C. section 360bbb-3(b)(1), unless the  authorization is terminated or revoked.  Performed at [facility], [address], [place], [place] [address]   MRSA Next Gen by PCR, Nasal     Status: Abnormal   Collection Time: 03/29/24  2:34 AM   Specimen: Nasal Mucosa; Nasal Swab  Result Value Ref Range Status   MRSA by PCR Next Gen DETECTED (A) NOT DETECTED Final    Comment: CRITICAL RESULT CALLED TO, READ BACK BY AND VERIFIED WITH: HILDA ANG, RN @ [phone] 03/29/24 MH (NOTE) The GeneXpert MRSA Assay (FDA approved for NASAL specimens only), is one component of a comprehensive MRSA colonization surveillance program. It is not intended to diagnose MRSA infection nor to guide or monitor treatment for MRSA infections. Test performance is not FDA approved in patients less than 60 years old. Performed at [facility], [address], [place], [place] [address]      Time coordinating discharge: Over 30 minutes  SIGNED:   Alvira Philips [place], DO  [facility] 03/31/2024, 9:36 AM

## 2024-03-31 NOTE — TOC Transition Note (Signed)
 Transition of Care [facility]) - Discharge Note  Patient Details  Name: Linda Foley MRN: [id number] Date of Birth: 1966-12-12  Transition of Care [facility]) CM/SW Contact:  Ewing Schlein, LCSW Phone Number: 03/31/2024, 11:54 AM  Clinical Narrative: Patient will need a travel oxygen tank to get home. CSW followed up with Mitch with [employer] to have travel tank delivered to patient's room. PT/OT evaluations recommended HH. [facility] and [facility] do not have any Medicaid contracts, so HH referrals were made to the following [facility] agencies:  [facility]: declined Ad[facility] copy[facility]: declined, at capacity for [facility]: declined [facility]: declined [facility]: out of network [facility]: declined [facility]: declined [facility]: declined, no availability  CSW updated patient regarding not being able to set up HHPT/OT. CSW provided patient with [facility] DSS contact information to renew her SNAP benefits. Patient aware [employer] will deliver a travel tank to her room. TOC signing off.  Final next level of care: Home/Self Care Barriers to Discharge: No Home Care Agency will accept this patient  Patient Goals and CMS Choice Patient states their goals for this hospitalization and ongoing recovery are:: Get oxygen travel tank Choice offered to / list presented to : NA  Discharge Plan and Services Additional resources added to the After Visit Summary for   In-house Referral: Clinical Social Work DME Agency: [facility] Date DME Agency Contacted: 03/31/24 Time DME Agency Contacted: 1117 Representative spoke with at DME Agency: Marthann Schiller  Social Drivers of Health (SDOH) Interventions SDOH Screenings   Food Insecurity: Food Insecurity Present (03/28/2024)  Housing: Low Risk  (03/28/2024)  Transportation Needs: Unmet Transportation Needs (03/28/2024)  Utilities: Not At Risk (03/28/2024)  Social Connections: Socially Isolated (03/28/2024)  Tobacco Use: High Risk (03/28/2024)   Readmission Risk Interventions    03/29/2024    1:56  PM  Readmission Risk Prevention Plan  Transportation Screening Complete  HRI or Home Care Consult Complete  Social Work Consult for Recovery Care Planning/Counseling Complete  Palliative Care Screening Not Applicable  Medication Review [employer]) Complete

## 2024-03-31 NOTE — Progress Notes (Signed)
 Discharge medications delivered to the patient at the bedside [facility]

## 2024-03-31 NOTE — Progress Notes (Signed)
 Discharge education provided, all questions answered. Oxygen and medications have been delivered to bedside. All belongings returned. Patient arranging her own transportation. Patient assisted to main entrance safely.

## 2024-04-12 ENCOUNTER — Encounter (HOSPITAL_BASED_OUTPATIENT_CLINIC_OR_DEPARTMENT_OTHER): Payer: Self-pay

## 2024-04-25 ENCOUNTER — Other Ambulatory Visit (HOSPITAL_COMMUNITY): Payer: Self-pay

## 2024-04-26 ENCOUNTER — Other Ambulatory Visit (HOSPITAL_COMMUNITY): Payer: Self-pay

## 2024-04-26 ENCOUNTER — Other Ambulatory Visit: Payer: Self-pay

## 2024-04-27 ENCOUNTER — Encounter (HOSPITAL_COMMUNITY): Payer: Self-pay

## 2024-04-27 ENCOUNTER — Other Ambulatory Visit (HOSPITAL_COMMUNITY): Payer: Self-pay

## 2024-04-29 ENCOUNTER — Other Ambulatory Visit (HOSPITAL_COMMUNITY): Payer: Self-pay

## 2024-05-01 ENCOUNTER — Other Ambulatory Visit (HOSPITAL_COMMUNITY): Payer: Self-pay

## 2024-05-04 ENCOUNTER — Other Ambulatory Visit (HOSPITAL_COMMUNITY): Payer: Self-pay

## 2024-05-06 ENCOUNTER — Other Ambulatory Visit (HOSPITAL_COMMUNITY): Payer: Self-pay

## 2024-06-11 ENCOUNTER — Other Ambulatory Visit (HOSPITAL_COMMUNITY): Payer: Self-pay
